# Patient Record
Sex: Male | Born: 1935 | Race: White | Hispanic: No | Smoking: Former smoker
Health system: Southern US, Community
[De-identification: ages and names within clinical notes are randomized; demographics above are authoritative.]

## PROBLEM LIST (undated history)

## (undated) DIAGNOSIS — T4145XA Adverse effect of unspecified anesthetic, initial encounter: Secondary | ICD-10-CM

## (undated) DIAGNOSIS — E7800 Pure hypercholesterolemia, unspecified: Secondary | ICD-10-CM

## (undated) DIAGNOSIS — C801 Malignant (primary) neoplasm, unspecified: Secondary | ICD-10-CM

## (undated) DIAGNOSIS — M1990 Unspecified osteoarthritis, unspecified site: Secondary | ICD-10-CM

## (undated) DIAGNOSIS — K5790 Diverticulosis of intestine, part unspecified, without perforation or abscess without bleeding: Secondary | ICD-10-CM

## (undated) DIAGNOSIS — I2510 Atherosclerotic heart disease of native coronary artery without angina pectoris: Secondary | ICD-10-CM

## (undated) DIAGNOSIS — E119 Type 2 diabetes mellitus without complications: Secondary | ICD-10-CM

## (undated) DIAGNOSIS — N183 Chronic kidney disease, stage 3 unspecified: Secondary | ICD-10-CM

## (undated) DIAGNOSIS — C44300 Unspecified malignant neoplasm of skin of unspecified part of face: Secondary | ICD-10-CM

## (undated) DIAGNOSIS — I200 Unstable angina: Secondary | ICD-10-CM

## (undated) DIAGNOSIS — R079 Chest pain, unspecified: Secondary | ICD-10-CM

## (undated) DIAGNOSIS — G3184 Mild cognitive impairment, so stated: Secondary | ICD-10-CM

## (undated) DIAGNOSIS — R001 Bradycardia, unspecified: Secondary | ICD-10-CM

## (undated) DIAGNOSIS — Z8679 Personal history of other diseases of the circulatory system: Secondary | ICD-10-CM

## (undated) DIAGNOSIS — D1771 Benign lipomatous neoplasm of kidney: Secondary | ICD-10-CM

## (undated) DIAGNOSIS — D049 Carcinoma in situ of skin, unspecified: Secondary | ICD-10-CM

## (undated) DIAGNOSIS — M545 Low back pain, unspecified: Secondary | ICD-10-CM

## (undated) DIAGNOSIS — J189 Pneumonia, unspecified organism: Secondary | ICD-10-CM

## (undated) DIAGNOSIS — G629 Polyneuropathy, unspecified: Secondary | ICD-10-CM

## (undated) DIAGNOSIS — I214 Non-ST elevation (NSTEMI) myocardial infarction: Secondary | ICD-10-CM

## (undated) DIAGNOSIS — K219 Gastro-esophageal reflux disease without esophagitis: Secondary | ICD-10-CM

## (undated) DIAGNOSIS — I10 Essential (primary) hypertension: Secondary | ICD-10-CM

## (undated) DIAGNOSIS — R0789 Other chest pain: Secondary | ICD-10-CM

## (undated) DIAGNOSIS — I429 Cardiomyopathy, unspecified: Secondary | ICD-10-CM

## (undated) DIAGNOSIS — I219 Acute myocardial infarction, unspecified: Secondary | ICD-10-CM

## (undated) DIAGNOSIS — G8929 Other chronic pain: Secondary | ICD-10-CM

## (undated) DIAGNOSIS — Z950 Presence of cardiac pacemaker: Secondary | ICD-10-CM

## (undated) DIAGNOSIS — K222 Esophageal obstruction: Secondary | ICD-10-CM

## (undated) DIAGNOSIS — T8859XA Other complications of anesthesia, initial encounter: Secondary | ICD-10-CM

## (undated) HISTORY — PX: COLONOSCOPY W/ BIOPSIES AND POLYPECTOMY: SHX1376

## (undated) HISTORY — DX: Cardiomyopathy, unspecified: I42.9

## (undated) HISTORY — PX: TONSILLECTOMY: SUR1361

## (undated) HISTORY — DX: Essential (primary) hypertension: I10

## (undated) HISTORY — PX: CATARACT EXTRACTION: SUR2

## (undated) HISTORY — PX: CORONARY ANGIOGRAPHY: CATH118303

## (undated) HISTORY — PX: CARDIAC CATHETERIZATION: SHX172

## (undated) HISTORY — DX: Unstable angina: I20.0

## (undated) HISTORY — DX: Bradycardia, unspecified: R00.1

## (undated) HISTORY — DX: Other chest pain: R07.89

## (undated) HISTORY — PX: CATARACT EXTRACTION W/ INTRAOCULAR LENS IMPLANT: SHX1309

## (undated) HISTORY — PX: EXCISIONAL HEMORRHOIDECTOMY: SHX1541

## (undated) HISTORY — PX: VASCULAR SURGERY: SHX849

## (undated) HISTORY — DX: Esophageal obstruction: K22.2

## (undated) HISTORY — DX: Diverticulosis of intestine, part unspecified, without perforation or abscess without bleeding: K57.90

## (undated) HISTORY — DX: Carcinoma in situ of skin, unspecified: D04.9

## (undated) HISTORY — DX: Benign lipomatous neoplasm of kidney: D17.71

## (undated) HISTORY — DX: Atherosclerotic heart disease of native coronary artery without angina pectoris: I25.10

## (undated) HISTORY — PX: JOINT REPLACEMENT: SHX530

## (undated) HISTORY — PX: SKIN CANCER EXCISION: SHX779

## (undated) HISTORY — DX: Personal history of other diseases of the circulatory system: Z86.79

## (undated) HISTORY — DX: Polyneuropathy, unspecified: G62.9

## (undated) HISTORY — DX: Non-ST elevation (NSTEMI) myocardial infarction: I21.4

## (undated) HISTORY — DX: Mild cognitive impairment of uncertain or unknown etiology: G31.84

## (undated) HISTORY — PX: ESOPHAGOGASTRODUODENOSCOPY: SHX1529

## (undated) HISTORY — DX: Chest pain, unspecified: R07.9

## (undated) HISTORY — PX: TOTAL KNEE ARTHROPLASTY: SHX125

---

## 2000-10-27 DIAGNOSIS — C801 Malignant (primary) neoplasm, unspecified: Secondary | ICD-10-CM | POA: Insufficient documentation

## 2000-10-27 HISTORY — PX: MASS EXCISION: SHX2000

## 2000-10-27 HISTORY — DX: Malignant (primary) neoplasm, unspecified: C80.1

## 2001-10-27 HISTORY — PX: CORONARY ANGIOPLASTY WITH STENT PLACEMENT: SHX49

## 2013-10-27 DIAGNOSIS — Z950 Presence of cardiac pacemaker: Secondary | ICD-10-CM

## 2013-10-27 HISTORY — DX: Presence of cardiac pacemaker: Z95.0

## 2013-10-27 HISTORY — PX: INSERT / REPLACE / REMOVE PACEMAKER: SUR710

## 2016-08-27 DIAGNOSIS — I214 Non-ST elevation (NSTEMI) myocardial infarction: Secondary | ICD-10-CM

## 2016-08-27 HISTORY — DX: Non-ST elevation (NSTEMI) myocardial infarction: I21.4

## 2016-12-17 ENCOUNTER — Ambulatory Visit (INDEPENDENT_AMBULATORY_CARE_PROVIDER_SITE_OTHER): Payer: Medicare Other | Admitting: Cardiology

## 2016-12-17 ENCOUNTER — Encounter: Payer: Self-pay | Admitting: Cardiology

## 2016-12-17 VITALS — BP 100/64 | HR 61 | Ht 72.0 in | Wt 221.0 lb

## 2016-12-17 DIAGNOSIS — I25119 Atherosclerotic heart disease of native coronary artery with unspecified angina pectoris: Secondary | ICD-10-CM

## 2016-12-17 DIAGNOSIS — I255 Ischemic cardiomyopathy: Secondary | ICD-10-CM

## 2016-12-17 DIAGNOSIS — Z950 Presence of cardiac pacemaker: Secondary | ICD-10-CM | POA: Diagnosis not present

## 2016-12-17 DIAGNOSIS — I495 Sick sinus syndrome: Secondary | ICD-10-CM | POA: Diagnosis not present

## 2016-12-17 DIAGNOSIS — E785 Hyperlipidemia, unspecified: Secondary | ICD-10-CM | POA: Insufficient documentation

## 2016-12-17 DIAGNOSIS — E119 Type 2 diabetes mellitus without complications: Secondary | ICD-10-CM | POA: Insufficient documentation

## 2016-12-17 LAB — CUP PACEART INCLINIC DEVICE CHECK
Battery Voltage: 3.01 V
Implantable Lead Location: 753860
Implantable Pulse Generator Implant Date: 20150916
Lead Channel Impedance Value: 475 Ohm
Lead Channel Pacing Threshold Amplitude: 0.75 V
Lead Channel Pacing Threshold Pulse Width: 0.4 ms
Lead Channel Pacing Threshold Pulse Width: 0.4 ms
Lead Channel Pacing Threshold Pulse Width: 0.4 ms
Lead Channel Pacing Threshold Pulse Width: 0.4 ms
Lead Channel Setting Pacing Pulse Width: 0.4 ms
MDC IDC LEAD IMPLANT DT: 20150916
MDC IDC LEAD IMPLANT DT: 20150916
MDC IDC LEAD LOCATION: 753859
MDC IDC MSMT LEADCHNL RA IMPEDANCE VALUE: 462.5 Ohm
MDC IDC MSMT LEADCHNL RA PACING THRESHOLD AMPLITUDE: 0.75 V
MDC IDC MSMT LEADCHNL RV PACING THRESHOLD AMPLITUDE: 1.25 V
MDC IDC MSMT LEADCHNL RV PACING THRESHOLD AMPLITUDE: 1.25 V
MDC IDC MSMT LEADCHNL RV SENSING INTR AMPL: 12 mV
MDC IDC PG SERIAL: 7659331
MDC IDC SESS DTM: 20180221112953
MDC IDC SET LEADCHNL RA PACING AMPLITUDE: 1.875
MDC IDC SET LEADCHNL RV PACING AMPLITUDE: 1.5 V
MDC IDC SET LEADCHNL RV SENSING SENSITIVITY: 2 mV
MDC IDC STAT BRADY RA PERCENT PACED: 46 %
MDC IDC STAT BRADY RV PERCENT PACED: 99.26 %

## 2016-12-17 NOTE — Progress Notes (Signed)
Electrophysiology Office Note   Date:  12/17/2016   ID:  Randall Callahan, DOB 06-09-1928, MRN [id number]  PCP:  No primary care provider on file. Primary Electrophysiologist:  Krish Bailly Meredith Leeds, MD    Chief Complaint  Patient presents with  . New Patient (Initial Visit)    Sick sinus syndrome     History of Present Illness: Randall Callahan is a 83 y.o. male who presents today for electrophysiology evaluation.   /He has a history of coronary artery disease status post stenting to the LAD and OM1 in 2003 with an occluded stent found in 2008, sick sinus syndrome status post dual-chamber pacemaker in 2015, hypertension, hyperlipidemia, type 2 diabetes. He had an an NSTEMI the beginning of Nov 2017 with a cath showing multivessel disease and no obvious targets for intervention. His medical management was optimized including beta blockers and long-acting nitrates. He was readmitted with an an STEMI. He did not have repeat catheterization at that time. His metoprolol was increased and he was put on heparin drip. His troponins increased to 0.24.   Today, he denies symptoms of palpitations, chest pain, shortness of breath, orthopnea, PND, lower extremity edema, claudication, dizziness, presyncope, syncope, bleeding, or neurologic sequela. The patient is tolerating medications without difficulties and is otherwise without complaint today.    Past Medical History:  Diagnosis Date  . Angina pectoris, unstable ([place])   . Angiomyolipoma of kidney   . ASCVD (arteriosclerotic cardiovascular disease)   . Bradycardia   . Cardiomyopathy ([place])   . Chest pain   . Chest tightness   . Coronary artery disease   . Diabetes mellitus without complication ([place])   . Diverticulosis   . Hx of sick sinus syndrome   . Hypertension   . MCI (mild cognitive impairment)   . NSTEMI (non-ST elevation myocardial infarction) ([place])   . Peripheral neuropathy ([place])   . Schatzki's ring   . Squamous cell carcinoma  in situ of skin    Past Surgical History:  Procedure Laterality Date  . CARDIAC CATHETERIZATION    . CORONARY ANGIOGRAPHY    . JOINT REPLACEMENT    . VASCULAR SURGERY       Current Outpatient Prescriptions  Medication Sig Dispense Refill  . aspirin EC 81 MG tablet Take 81 mg by mouth daily.    [name] atorvastatin (LIPITOR) 40 MG tablet Take 40 mg by mouth daily.    . clopidogrel (PLAVIX) 75 MG tablet Take 75 mg by mouth daily.    . isosorbide mononitrate (IMDUR) 30 MG 24 hr tablet Take 30 mg by mouth daily.    [name] lisinopril (PRINIVIL,ZESTRIL) 2.5 MG tablet Take 2.5 mg by mouth daily.    . metFORMIN (GLUCOPHAGE) 1000 MG tablet Take 1,000 mg by mouth 2 (two) times daily with a meal.    . METOPROLOL SUCCINATE ER PO 50 mg. TAKE 1.5 TABLETS BY MOUTH DAILY    . nitroGLYCERIN (NITROSTAT) 0.4 MG SL tablet Place 0.4 mg under the tongue every 5 (five) minutes as needed for chest pain. Up to 3 doses for chest pain. If pain has not subsided after 3 doses go to the ER.    . Omega-3 Fatty Acids (FISH OIL PO) Take 300 mg by mouth daily.    . pregabalin (LYRICA) 75 MG capsule Take 150 mg by mouth 2 (two) times daily.    . Probiotic Product (SOLUBLE FIBER/PROBIOTICS PO) Take by mouth daily.    . Wheat Dextrin (BENEFIBER) POWD Take as directed  No current facility-administered medications for this visit.     Allergies:   Patient has no known allergies.   Social History:  The patient  reports that he has quit smoking. His smoking use included Cigarettes. He quit after 2.00 years of use. He has never used smokeless tobacco. He reports that he drinks about 1.2 - 1.8 oz of alcohol per week . He reports that he does not use drugs.   Family History:  The patient's family history includes Angina in his mother; Cancer in his brother and brother.    ROS:  Please see the history of present illness.   Otherwise, review of systems is positive for leg pain, easy bruising.   All other systems are reviewed and  negative.    PHYSICAL EXAM: VS:  BP 100/64   Pulse 61   Ht 6' (1.829 m)   Wt 221 lb (100.2 kg)   BMI 29.97 kg/m  , BMI Body mass index is 29.97 kg/m. GEN: Well nourished, well developed, in no acute distress  HEENT: normal  Neck: no JVD, carotid bruits, or masses Cardiac: RRR; no murmurs, rubs, or gallops,no edema  Respiratory:  clear to auscultation bilaterally, normal work of breathing GI: soft, nontender, nondistended, + BS MS: no deformity or atrophy  Skin: warm and dry,  device pocket is well healed Neuro:  Strength and sensation are intact Psych: euthymic mood, full affect  EKG:  EKG is ordered today. Personal review of the ekg ordered shows AV paced   Device interrogation is reviewed today in detail.  See PaceArt for details.   Recent Labs: No results found for requested labs within last 8760 hours.    Lipid Panel  No results found for: CHOL, TRIG, HDL, CHOLHDL, VLDL, LDLCALC, LDLDIRECT   Wt Readings from Last 3 Encounters:  12/17/16 221 lb (100.2 kg)      Other studies Reviewed: Additional studies/ records that were reviewed today include: Transthoracic echo 11/21/16  Review of the above records today demonstrates: LV chamber size normal, basal septal hypertrophy, EF 44%. Normal RV global systolic function. Mild aortic regurgitation. Mild to moderate mitral regurgitation.  Cardiac cath 08/27/16  -Left main mild diffuse disease -LAD moderate diffuse disease, the mid segment of the LAD has a single discrete total occlusion. Distal flow was via collaterals from the RCA. The distal vessel was large. The lesion represented in-stent restenosis following a prior coronary stent intervention. -Left circumflex: Mild diffuse disease in the entire vessel. There was a 90+ percent diffuse stenosis of the proximal segment of the first OM. The arm was moderate size. Distal flow was decreased and was via the native vessel and collaterals from the RCA.  -Right coronary artery:  Mild diffuse disease. Moderate diffuse disease of the entire vessel segment of the right PDA.  -Ramus: Moderate diffuse disease of the proximal segment.   ASSESSMENT AND PLAN:  1.  Coronary artery disease: On aspirin and Plavix. Also takes long-acting nitrates and metoprolol for chest pain control. He has apparently diffuse coronary disease without obvious targets for intervention. He also has an occluded LAD with multiple vessels receiving collaterals from the RCA. We'll continue current management. He was undergoing cardiac rehabilitation in [place], but has since moved to [place] for a short period of time. We'll refer him to cardiac rehabilitation today.  2. Ischemic cardiomyopathy: Early well compensated without any issues of volume overload. On optimal medical therapy. Continue current management.  3. Sick sinus syndrome: Status post dual-chamber pacemaker. Pacemaker interrogated  today without issue.  4. Hyperlipidemia: Continue statin therapy.  Current medicines are reviewed at length with the patient today.   The patient does not have concerns regarding his medicines.  The following changes were made today:  none  Labs/ tests ordered today include:  Orders Placed This Encounter  Procedures  . EKG 12-Lead     Disposition:   FU with Medrith Veillon 6 months  Signed, Galina Haddox Meredith Leeds, MD  12/17/2016 11:13 AM     [facility] [address] [place] [address] [phone] (office) [phone] (fax)

## 2016-12-17 NOTE — Patient Instructions (Signed)
Medication Instructions:  Your physician recommends that you continue on your current medications as directed. Please refer to the Current Medication list given to you today.   Labwork: None Ordered   Testing/Procedures: None Ordered   Follow-Up: Your physician wants you to follow-up in: 6 months with Dr. Curt Bears. You will receive a reminder letter in the mail two months in advance. If you don't receive a letter, please call our office to schedule the follow-up appointment.  Remote monitoring is used to monitor your Pacemaker from home. This monitoring reduces the number of office visits required to check your device to one time per year. It allows [place] to keep an eye on the functioning of your device to ensure it is working properly. You are scheduled for a device check from home on . You may send your transmission at any time that day. If you have a wireless device, the transmission will be sent automatically. After your physician reviews your transmission, you will receive a postcard with your next transmission date.    Any Other Special Instructions Will Be Listed Below (If Applicable).     If you need a refill on your cardiac medications before your next appointment, please call your pharmacy.

## 2017-01-06 ENCOUNTER — Telehealth (HOSPITAL_COMMUNITY): Payer: Self-pay | Admitting: *Deleted

## 2017-01-06 ENCOUNTER — Telehealth: Payer: Self-pay | Admitting: Cardiology

## 2017-01-06 ENCOUNTER — Telehealth (HOSPITAL_COMMUNITY): Payer: Self-pay | Admitting: General Practice

## 2017-01-06 NOTE — Telephone Encounter (Signed)
Verified Medicare A/B & AARP insurance benefits through [employer]. Reference [phone] & [phone]...  KJ

## 2017-01-06 NOTE — Telephone Encounter (Signed)
New message    Pt daughter states that her dad never got a phone call from cardiac rehab for the referral we sent over. She states now they cannot see him until April and thinks he needs to be seen sooner so she wants the referral sent to [facility] because he can get in, in March. Requests a call back.

## 2017-01-06 NOTE — Telephone Encounter (Signed)
Returned call to patient daughter in law [place].  Message left on answering machine.  Pt daughter was en route from picking up medication for her daughter who was home sick.  [name] asked to call us back when she returned home in about 10 minutes.   [name] called back and spoke to rehab staff concerning her father in law participating in rehab.  Pt daughter in law voiced frustration and was very upset that this was taking so long. [name] stated that she called three different times and had not received any return call.  Rehab staff apologized and offered any assistance to help facilitate the process. Pt verbalized that she had also experienced a delay in care for her daughter to receive OT therapy at [facility].  Pt daughter in law stated that it took 6 months and she did not want this to happen to her father in law since he had a heart attack one month ago. Again offered sincere apologies on behalf of [facility] and reassured her that we have recently implemented steps within our department to improve the referral process. Offered pt daughter first available orientation appointment later portion of March or first week in april. Pt daughter did not want any delay and wanted to start sooner.  Offered cancellation list. Indicated that her father in law would be third on the list.  Pt daughter in law asked could he go somewhere else for his cardiac rehab.  Offered alternatives [facility], [facility] and [facility]. Pt declined [facility] indicated that would be too far  Pt asked for contact information for each of the other cardiac rehab programs.  Information given to [facility].  [name] advised that should she be able to get in sooner at another facility she would let us know but would like to remain on the cancellation list.  Pt placed on cancellation list. Maurice Small RN, BSN

## 2017-01-07 NOTE — Telephone Encounter (Signed)
Melissa faxing referral form.  Will review w/ [place] next Monday when he returns to the office.

## 2017-01-07 NOTE — Telephone Encounter (Signed)
Lm for Randall Callahan w/ [facility]

## 2017-01-07 NOTE — Telephone Encounter (Signed)
Dtr-in-law returns my call and would like pt to establish cardiac rehab in [facility].   She gave me contact name there of: Melissa [phone]. She understands I will call them today to see how I can help get this started for pt. She is agreeable to plan.

## 2017-01-08 ENCOUNTER — Ambulatory Visit (INDEPENDENT_AMBULATORY_CARE_PROVIDER_SITE_OTHER): Payer: Medicare Other | Admitting: Neurology

## 2017-01-08 ENCOUNTER — Encounter: Payer: Self-pay | Admitting: Neurology

## 2017-01-08 DIAGNOSIS — R269 Unspecified abnormalities of gait and mobility: Secondary | ICD-10-CM | POA: Diagnosis not present

## 2017-01-08 DIAGNOSIS — M542 Cervicalgia: Secondary | ICD-10-CM | POA: Diagnosis not present

## 2017-01-08 DIAGNOSIS — M545 Low back pain, unspecified: Secondary | ICD-10-CM | POA: Insufficient documentation

## 2017-01-08 NOTE — Progress Notes (Signed)
PATIENT: Randall Callahan DOB: 19-Jan-1928  Chief Complaint  Patient presents with  . Peripheral Neuropathy    He is here with daughter-in-law, [name].  Reports numbness in bilateral legs, below his knees, for the last five years.    [name] PCP    Dineen Kid, MD     HISTORICAL  Randall Callahan is 83 years old right-handed male, accompanied by his daughter-in-law [name], seen in refer by his primary care from [facility] Dr. [name] Via for evaluation of numbness from bilateral knee done, initial evaluation was on January 08 2017.  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 He had a history of sick sinus syndrome, status post pacemaker in 2016.  He reported as soon as she woke up from left knee replacement surgery in 2013 he noticed left ankle pain, numbness of left foot, which has been persistent since then,  shortly afterwards, he also noticed fairly subacute onset bilateral lower extremity numbness from knee down, over the past few years, his symptoms has been fairly stable, he has been taking Lyrica 75 mg twice a day, which has helped him moderately.   He also had a gradual onset gait abnormality, getting worse since 2017, stiff, unsteady gait, tends to fall, he has significant low back pain, getting worse by weight, with prolonged walking, he denies bowel and bladder incontinence, he also has intermittent right hand paresthesia, mild neck pain, but denies cervical radiating pain    For a while, he has frequent bilateral eye muscle cramping, now has improved.  Laboratory evaluation in March 2018, normal C-reactive protein, TSH, ESR, CMP with glucose 1:30, creatinine 1.29, with GFR 53, normal CBC, hemoglobin of 14.3, low vitamin B12  240,  REVIEW OF SYSTEMS: Full 14 system review of systems performed and notable only for as above   ALLERGIES: No Known Allergies  HOME MEDICATIONS: Current Outpatient Prescriptions  Medication Sig Dispense Refill  . aspirin EC 81 MG tablet Take 81 mg by mouth daily.    [name] atorvastatin (LIPITOR) 40 MG tablet Take 40 mg by mouth daily.    . clopidogrel (PLAVIX) 75 MG tablet Take 75 mg by mouth daily.    . isosorbide mononitrate (IMDUR) 30 MG 24 hr tablet Take 30 mg by mouth daily.    [name] lisinopril (PRINIVIL,ZESTRIL) 2.5 MG tablet Take 2.5 mg by mouth daily.    . metFORMIN (GLUCOPHAGE) 1000 MG tablet Take 1,000 mg by mouth 2 (two) times daily with a meal.    . METOPROLOL SUCCINATE ER PO 50 mg. TAKE 1.5 TABLETS BY MOUTH DAILY    . nitroGLYCERIN (NITROSTAT) 0.4 MG SL tablet Place 0.4 mg under the tongue every 5 (five) minutes as needed for chest pain. Up to 3 doses for chest pain. If pain has not subsided after 3 doses go to the ER.    . Omega-3 Fatty Acids (FISH OIL PO) Take 300 mg by mouth daily.    . pregabalin (LYRICA) 75 MG capsule Take 150 mg by mouth 2 (two) times daily.    . Probiotic Product (SOLUBLE FIBER/PROBIOTICS PO) Take by mouth daily.    . Wheat Dextrin (BENEFIBER) POWD Take as directed     No current facility-administered medications for this visit.     PAST MEDICAL HISTORY: Past  Medical History:  Diagnosis Date  . Angina pectoris, unstable ([facility])   . Angiomyolipoma of kidney   . ASCVD (arteriosclerotic cardiovascular disease)   . Bradycardia   . Cardiomyopathy ([facility])   . Chest pain   . Chest tightness   . Coronary artery disease   . Diabetes mellitus without complication ([facility])   . Diverticulosis   . Hx of sick sinus syndrome   . Hypertension   . MCI (mild cognitive impairment)   . NSTEMI (non-ST elevation myocardial infarction) ([facility])   . Peripheral neuropathy ([facility])   . Schatzki's ring   . Squamous cell carcinoma in situ of skin     PAST SURGICAL  HISTORY: Past Surgical History:  Procedure Laterality Date  . CARDIAC CATHETERIZATION    . CORONARY ANGIOGRAPHY    . JOINT REPLACEMENT    . VASCULAR SURGERY      FAMILY HISTORY: Family History  Problem Relation Age of Onset  . Angina Mother   . Stroke Mother   . Other Father     Car accident  . Prostate cancer Brother   . Cancer Brother     SOCIAL HISTORY:  Social History   Social History  . Marital status: Divorced    Spouse name: N/A  . Number of children: 6  . Years of education: some college   Occupational History  . Retired    Social History Main Topics  . Smoking status: Former Smoker    Years: 2.00    Types: Cigarettes  . Smokeless tobacco: Never Used     Comment: STARTED SMOKING IN 4TH GRADE, QUIT IN 6TH  . Alcohol use 1.2 - 1.8 oz/week    1 - 2 Glasses of wine, 1 Cans of beer per week     Comment: 1-2 drinks per week  . Drug use: No  . Sexual activity: No   Other Topics Concern  . Not on file   Social History Narrative   Lives at home with son and daughter-in-law.   Right-handed.   2 cups caffeine per day.     PHYSICAL EXAM   Vitals:   01/08/17 1018  BP: 114/67  Pulse: 62  Weight: 218 lb (98.9 kg)  Height: 6' (1.829 m)    Not recorded      Body mass index is 29.57 kg/m.  PHYSICAL EXAMNIATION:  Gen: NAD, conversant, well nourised, obese, well groomed                     Cardiovascular: Regular rate rhythm, no peripheral edema, warm, nontender. Eyes: Conjunctivae clear without exudates or hemorrhage Neck: Supple, no carotid bruits. Pulmonary: Clear to auscultation bilaterally   NEUROLOGICAL EXAM:  MENTAL STATUS: Speech:    Speech is normal; fluent and spontaneous with normal comprehension.  Cognition:     Orientation to time, place and person     Normal recent and remote memory     Normal Attention span and concentration     Normal Language, naming, repeating,spontaneous speech     Fund of knowledge   CRANIAL  NERVES: CN II: Visual fields are full to confrontation. Fundoscopic exam is normal with sharp discs and no vascular changes. Pupils are round equal and briskly reactive to light. CN III, IV, VI: extraocular movement are normal. No ptosis. CN V: Facial sensation is intact to pinprick in all 3 divisions bilaterally. Corneal responses are intact.  CN VII: Face is symmetric with normal eye closure and smile. CN VIII: Hearing is   normal to rubbing fingers CN IX, X: Palate elevates symmetrically. Phonation is normal. CN XI: Head turning and shoulder shrug are intact CN XII: Tongue is midline with normal movements and no atrophy.  MOTOR: He has mild spasticity of bilateral lower extremity, decreased range of motion of neck muscles, no significant bilateral upper or lower extremity proximal distal muscle noticed, with exception of mild left toe flexion, extension weakness.  REFLEXES: Reflexes are 3 and symmetric at the biceps, triceps, mildly decreased reflex at left knee due to previous left knee replacement, 3 at right knee and 2 at bilateral ankles. Plantar responses are extensor bilaterally  SENSORY: Decreased vibratory sensation at bilateral lower extremity, decrease the needle prick to midshin level,  COORDINATION: Rapid alternating movements and fine finger movements are intact. There is no dysmetria on finger-to-nose and heel-knee-shin.    GAIT/STANCE: He needs push up to get up from seated position, stiff, wide based, cautious, dragging left leg some  DIAGNOSTIC DATA (LABS, IMAGING, TESTING) - I reviewed patient records, labs, notes, testing and imaging myself where available.   ASSESSMENT AND PLAN  Cashius Grandstaff is a 83 y.o. male   subacute onset of bilateral lower extremity paresthesia from knee down following his left knee replacement in 2013 under general anesthesia  Long history of diabetes, multiple arthritis, chronic neck, low back pain  Hyperreflexia, length dependent  sensory changes on examinations  Differentiation diagnosis including cervical spondylitic myelopathy, lumbar radiculopathy, peripheral neuropathy,  Not MRI candidate due to pacemaker placement  CT of cervical spine, lumbar spine,  EMG nerve conduction study  Continue Lyrica 75 mg twice a day for symptoms control   Marcial Pacas, M.D. Ph.D.  [facility] [address], [place], [place] [address] Ph: [phone] Fax: [phone]  CC: Dineen Kid, MD

## 2017-01-12 NOTE — Telephone Encounter (Signed)
Rehab referral signed and faxed. Family notified.  Pt start therapy in [place] on Thursday.

## 2017-01-27 ENCOUNTER — Encounter: Payer: Self-pay | Admitting: Neurology

## 2017-02-06 ENCOUNTER — Ambulatory Visit
Admission: RE | Admit: 2017-02-06 | Discharge: 2017-02-06 | Disposition: A | Discharge status: Home / Self Care | Payer: Medicare Other | Source: Ambulatory Visit | Attending: Neurology | Admitting: Neurology

## 2017-02-06 DIAGNOSIS — M542 Cervicalgia: Secondary | ICD-10-CM

## 2017-02-06 DIAGNOSIS — R269 Unspecified abnormalities of gait and mobility: Secondary | ICD-10-CM

## 2017-02-06 DIAGNOSIS — M545 Low back pain: Secondary | ICD-10-CM

## 2017-02-11 ENCOUNTER — Telehealth: Payer: Self-pay | Admitting: Neurology

## 2017-02-11 NOTE — Telephone Encounter (Signed)
Please call patient, CT of cervical, and lumbar spine showed multilevel degenerative disc disease, with possible right L3 nerve roots compression,  I will review CT films at his next follow-up visit on May eleventh 2018  CT lumbar Atherosclerotic aorta. Focal bulging of the mid abdominal aorta posteriorly most likely due to atherosclerotic aneurysm measuring approximately 3 cm. Accurate measurement not possible. Recommend followup by ultrasound in 2 years. .  Mild spinal stenosis L2-3  Moderate spinal stenosis at L3-4 with moderate right foraminal encroachment and possible impingement of the right L3 nerve root  Mild spinal stenosis L4-5  Mild subarticular stenosis bilaterally L5-S1   CT cervical spine Multilevel disc and facet degeneration throughout the cervical spine  Right foraminal narrowing C2-3 due to spurring  Mild spinal stenosis and mild to moderate foraminal stenosis bilaterally at C3-4  Mild right foraminal narrowing at C4-5  Moderate foraminal narrowing bilaterally at C5-6  Moderate right foraminal stenosis at C6-7 with mild spinal stenosis.

## 2017-02-11 NOTE — Telephone Encounter (Signed)
Left message for a return call

## 2017-02-12 NOTE — Telephone Encounter (Signed)
Spoke to patient - reviewed his results with him.  He will keep his appt on 03/06/17 for further discussion with Dr. Krista Blue.

## 2017-02-12 NOTE — Telephone Encounter (Signed)
Pt returned your call, he is asking to be called on his mobile

## 2017-02-13 ENCOUNTER — Encounter: Payer: Medicare Other | Admitting: Neurology

## 2017-02-17 ENCOUNTER — Emergency Department (HOSPITAL_COMMUNITY): Payer: Medicare Other

## 2017-02-17 ENCOUNTER — Encounter (HOSPITAL_COMMUNITY): Payer: Self-pay

## 2017-02-17 ENCOUNTER — Emergency Department (HOSPITAL_COMMUNITY)
Admission: EM | Admit: 2017-02-17 | Discharge: 2017-02-17 | Disposition: A | Discharge status: Home / Self Care | Payer: Medicare Other | Attending: Emergency Medicine | Admitting: Emergency Medicine

## 2017-02-17 DIAGNOSIS — I252 Old myocardial infarction: Secondary | ICD-10-CM | POA: Diagnosis not present

## 2017-02-17 DIAGNOSIS — E119 Type 2 diabetes mellitus without complications: Secondary | ICD-10-CM | POA: Diagnosis not present

## 2017-02-17 DIAGNOSIS — J189 Pneumonia, unspecified organism: Secondary | ICD-10-CM | POA: Diagnosis not present

## 2017-02-17 DIAGNOSIS — I10 Essential (primary) hypertension: Secondary | ICD-10-CM | POA: Diagnosis not present

## 2017-02-17 DIAGNOSIS — Z7982 Long term (current) use of aspirin: Secondary | ICD-10-CM | POA: Diagnosis not present

## 2017-02-17 DIAGNOSIS — I2510 Atherosclerotic heart disease of native coronary artery without angina pectoris: Secondary | ICD-10-CM | POA: Insufficient documentation

## 2017-02-17 DIAGNOSIS — R05 Cough: Secondary | ICD-10-CM | POA: Diagnosis present

## 2017-02-17 DIAGNOSIS — Z87891 Personal history of nicotine dependence: Secondary | ICD-10-CM | POA: Insufficient documentation

## 2017-02-17 DIAGNOSIS — Z7984 Long term (current) use of oral hypoglycemic drugs: Secondary | ICD-10-CM | POA: Insufficient documentation

## 2017-02-17 LAB — CBC WITH DIFFERENTIAL/PLATELET
Basophils Absolute: 0 10*3/uL (ref 0.0–0.1)
Basophils Relative: 0 %
Eosinophils Absolute: 0.1 10*3/uL (ref 0.0–0.7)
Eosinophils Relative: 1 %
HEMATOCRIT: 42.1 % (ref 39.0–52.0)
HEMOGLOBIN: 14.3 g/dL (ref 13.0–17.0)
LYMPHS ABS: 2 10*3/uL (ref 0.7–4.0)
LYMPHS PCT: 19 %
MCH: 29.7 pg (ref 26.0–34.0)
MCHC: 34 g/dL (ref 30.0–36.0)
MCV: 87.3 fL (ref 78.0–100.0)
MONOS PCT: 7 %
Monocytes Absolute: 0.8 10*3/uL (ref 0.1–1.0)
NEUTROS ABS: 7.6 10*3/uL (ref 1.7–7.7)
NEUTROS PCT: 73 %
Platelets: 220 10*3/uL (ref 150–400)
RBC: 4.82 MIL/uL (ref 4.22–5.81)
RDW: 14.4 % (ref 11.5–15.5)
WBC: 10.5 10*3/uL (ref 4.0–10.5)

## 2017-02-17 LAB — COMPREHENSIVE METABOLIC PANEL
ALK PHOS: 72 U/L (ref 38–126)
ALT: 17 U/L (ref 17–63)
ANION GAP: 9 (ref 5–15)
AST: 25 U/L (ref 15–41)
Albumin: 3.9 g/dL (ref 3.5–5.0)
BILIRUBIN TOTAL: 1 mg/dL (ref 0.3–1.2)
BUN: 16 mg/dL (ref 6–20)
CALCIUM: 9.3 mg/dL (ref 8.9–10.3)
CO2: 24 mmol/L (ref 22–32)
CREATININE: 1.18 mg/dL (ref 0.61–1.24)
Chloride: 105 mmol/L (ref 101–111)
GFR calc non Af Amer: 54 mL/min — ABNORMAL LOW (ref >60)
Glucose, Bld: 153 mg/dL — ABNORMAL HIGH (ref 65–99)
Potassium: 4.3 mmol/L (ref 3.5–5.1)
SODIUM: 138 mmol/L (ref 135–145)
TOTAL PROTEIN: 7.1 g/dL (ref 6.5–8.1)

## 2017-02-17 LAB — CBG MONITORING, ED: Glucose-Capillary: 182 mg/dL — ABNORMAL HIGH (ref 65–99)

## 2017-02-17 LAB — TROPONIN I: Troponin I: <0.03 ng/mL (ref <0.03)

## 2017-02-17 MED ORDER — ALBUTEROL SULFATE HFA 108 (90 BASE) MCG/ACT IN AERS
2.0000 | INHALATION_SPRAY | Freq: Once | RESPIRATORY_TRACT | Status: DC
  Filled 2017-02-17: qty 6.7

## 2017-02-17 MED ORDER — AZITHROMYCIN 250 MG PO TABS: 250.0000 mg | ORAL_TABLET | Freq: Every day | ORAL | 0 refills | Status: DC

## 2017-02-17 MED ORDER — DEXTROSE 5 % IV SOLN
500.0000 mg | INTRAVENOUS | Status: DC
  Administered 2017-02-17: 500 mg via INTRAVENOUS
  Filled 2017-02-17: qty 500

## 2017-02-17 MED ORDER — DEXTROSE 5 % IV SOLN
1.0000 g | Freq: Once | INTRAVENOUS | Status: AC
  Administered 2017-02-17: 1 g via INTRAVENOUS
  Filled 2017-02-17: qty 10

## 2017-02-17 NOTE — ED Provider Notes (Signed)
[facility] DEPT Provider Note   CSN: [id number] Arrival date & time: 02/17/17  2353     History   Chief Complaint Chief Complaint  Patient presents with  . Cough    HPI Randall Callahan is a 83 y.o. male.  The history is provided by the patient. No language interpreter was used.  Cough  This is a new problem. The current episode started more than 2 days ago. The problem occurs constantly. The problem has been gradually worsening. The cough is productive of sputum. There has been no fever. Pertinent negatives include no chest pain. He has tried nothing for the symptoms. The treatment provided no relief. He is not a smoker. His past medical history does not include bronchitis or asthma.    Past Medical History:  Diagnosis Date  . Angina pectoris, unstable ([place])   . Angiomyolipoma of kidney   . ASCVD (arteriosclerotic cardiovascular disease)   . Bradycardia   . Cardiomyopathy ([place])   . Chest pain   . Chest tightness   . Coronary artery disease   . Diabetes mellitus without complication ([place])   . Diverticulosis   . Hx of sick sinus syndrome   . Hypertension   . MCI (mild cognitive impairment)   . NSTEMI (non-ST elevation myocardial infarction) ([place])   . Peripheral neuropathy   . Schatzki's ring   . Squamous cell carcinoma in situ of skin     Patient Active Problem List   Diagnosis Date Noted  . Gait abnormality 01/08/2017  . Neck pain 01/08/2017  . Low back pain 01/08/2017  . Ischemic cardiomyopathy 12/17/2016  . Coronary artery disease involving native coronary artery of native heart with angina pectoris ([place]) 12/17/2016  . Type 2 diabetes mellitus without complications ([place]) 61/44/3154  . Hyperlipidemia 12/17/2016    Past Surgical History:  Procedure Laterality Date  . CARDIAC CATHETERIZATION    . CORONARY ANGIOGRAPHY    . JOINT REPLACEMENT    . VASCULAR SURGERY         Home Medications    Prior to Admission medications   Medication Sig Start  Date End Date Taking? Authorizing Provider  aspirin 325 MG tablet Take 325-650 mg by mouth every 4 (four) hours as needed for mild pain, moderate pain, fever or headache.   Yes Historical Provider, MD  aspirin EC 81 MG tablet Take 81 mg by mouth daily with breakfast.    Yes Historical Provider, MD  atorvastatin (LIPITOR) 40 MG tablet Take 40 mg by mouth at bedtime.    Yes Historical Provider, MD  bifidobacterium infantis (ALIGN) capsule Take 1 capsule by mouth daily with breakfast.   Yes Historical Provider, MD  bismuth subsalicylate (PEPTO BISMOL) 262 MG chewable tablet Chew 262-524 mg by mouth as needed for indigestion or diarrhea or loose stools.    Yes Historical Provider, MD  clopidogrel (PLAVIX) 75 MG tablet Take 75 mg by mouth daily with breakfast.    Yes Historical Provider, MD  isosorbide mononitrate (IMDUR) 30 MG 24 hr tablet Take 30 mg by mouth daily with breakfast.    Yes Historical Provider, MD  Javier Docker Oil 300 MG CAPS Take 300 mg by mouth every evening.   Yes Historical Provider, MD  lisinopril (PRINIVIL,ZESTRIL) 2.5 MG tablet Take 2.5 mg by mouth daily with breakfast.    Yes Historical Provider, MD  metFORMIN (GLUCOPHAGE) 1000 MG tablet Take 1,000 mg by mouth 2 (two) times daily with a meal.   Yes Historical Provider, MD  metoprolol succinate (TOPROL-XL)  50 MG 24 hr tablet Take 75 mg by mouth daily after breakfast. Take with or immediately following a meal.   Yes Historical Provider, MD  nitroGLYCERIN (NITROSTAT) 0.4 MG SL tablet Place 0.4 mg under the tongue every 5 (five) minutes as needed for chest pain. Up to 3 doses for chest pain. If pain has not subsided after 3 doses go to the ER.   Yes Historical Provider, MD  pregabalin (LYRICA) 150 MG capsule Take 150 mg by mouth 2 (two) times daily.   Yes Historical Provider, MD  Wheat Dextrin (BENEFIBER) POWD Take 15 mLs by mouth daily with breakfast. Take as directed    Yes Historical Provider, MD    Family History Family History    Problem Relation Age of Onset  . Angina Mother   . Stroke Mother   . Other Father     Car accident  . Prostate cancer Brother   . Cancer Brother     Social History Social History  Substance Use Topics  . Smoking status: Former Smoker    Years: 2.00    Types: Cigarettes  . Smokeless tobacco: Never Used     Comment: STARTED SMOKING IN 4TH GRADE, QUIT IN [place]  . Alcohol use 1.2 - 1.8 oz/week    1 - 2 Glasses of wine, 1 Cans of beer per week     Comment: 1-2 drinks per week     Allergies   Patient has no known allergies.   Review of Systems Review of Systems  Respiratory: Positive for cough.   Cardiovascular: Negative for chest pain.  All other systems reviewed and are negative.    Physical Exam Updated Vital Signs BP 113/76   Pulse (!) 105   Temp 98.3 F (36.8 C)   Resp 20   SpO2 96%   Physical Exam  Constitutional: He appears well-developed and well-nourished.  HENT:  Head: Normocephalic and atraumatic.  Right Ear: External ear normal.  Left Ear: External ear normal.  Mouth/Throat: Oropharynx is clear and moist.  Eyes: Conjunctivae are normal.  Neck: Neck supple.  Cardiovascular: Normal rate and regular rhythm.   No murmur heard. Pulmonary/Chest: Effort normal and breath sounds normal. No respiratory distress.  Rhonchi with deep breaths  Abdominal: Soft. There is no tenderness.  Musculoskeletal: He exhibits no edema.  Neurological: He is alert.  Skin: Skin is warm and dry.  Psychiatric: He has a normal mood and affect.  Nursing note and vitals reviewed.    ED Treatments / Results  Labs (all labs ordered are listed, but only abnormal results are displayed) Labs Reviewed  COMPREHENSIVE METABOLIC PANEL - Abnormal; Notable for the following:       Result Value   Glucose, Bld 153 (*)    GFR calc non Af Amer 54 (*)    All other components within normal limits  CBC WITH DIFFERENTIAL/PLATELET  TROPONIN I    EKG  EKG  Interpretation  Date/Time:  Tuesday February 17 2017 07:53:56 EDT Ventricular Rate:  63 PR Interval:    QRS Duration: 194 QT Interval:  469 QTC Calculation: 481 R Axis:   -68 Text Interpretation:  A-V dual-paced rhythm with some inhibition No further analysis attempted due to paced rhythm Confirmed by Wilson Singer  MD, STEPHEN [id number]) on 02/17/2017 9:22:07 AM       Radiology Dg Chest 2 View  Result Date: 02/17/2017 CLINICAL DATA:  83 year old male with wheezing and nonproductive cough for 3 days. EXAM: CHEST  2 VIEW COMPARISON:  None. FINDINGS: Left chest dual lead cardiac pacemaker. Large lung volumes. Mediastinal contours are within normal limits. Visualized tracheal air column is within normal limits. Attenuated bronchovascular markings in the upper lobes and at the lung bases suggesting emphysema. Superimposed bilateral perihilar patchy and reticulonodular opacity greater on the right. No consolidation. No pleural effusion. No acute osseous abnormality identified. Negative visible bowel gas pattern. IMPRESSION: 1. Chronic lung disease with hyperinflation. 2. No prior comparison. Right greater than left perihilar patchy and interstitial opacities are nonspecific but suspicious for multifocal pneumonia in this clinical setting. No pleural effusion. 3. Left chest pacemaker. Electronically Signed   By: Genevie Ann M.D.   On: 02/17/2017 07:22    Procedures Procedures (including critical care time)  Medications Ordered in ED Medications  azithromycin (ZITHROMAX) 500 mg in dextrose 5 % 250 mL IVPB (500 mg Intravenous New Bag/Given 02/17/17 0930)  cefTRIAXone (ROCEPHIN) 1 g in dextrose 5 % 50 mL IVPB (1 g Intravenous New Bag/Given 02/17/17 0929)   Pt on 02 at 2 liters.  Pt drops sats to low 90's when coughing.  Chest xray shows probable multifocal pneumonia.   Pt given Iv Rocephin and zithromax.  Pt was able to stand up and cough up phelgm and is breathing better per RN.   99 without 02.  Pt feels better.   Pt given flutter valve and albuterol inhaler with instructions per respiratory.    Initial Impression / Assessment and Plan / ED Course  I have reviewed the triage vital signs and the nursing notes.  Pertinent labs & imaging results that were available during my care of the patient were reviewed by me and considered in my medical decision making (see chart for details).  Clinical Course as of Feb 18 1043  Tue Feb 17, 2017  0902 EKG 12-Lead [LS]  0903 EKG 12-Lead [LS]  8185 ED EKG [LS]  0903 EKG 12-Lead [LS]    Clinical Course User Index [LS] Fransico Meadow, PA-C    Iv Zithromax and Rocephin  Final Clinical Impressions(s) / ED Diagnoses   Final diagnoses:  Community acquired pneumonia, unspecified laterality   Meds ordered this encounter  Medications  . cefTRIAXone (ROCEPHIN) 1 g in dextrose 5 % 50 mL IVPB    Order Specific Question:   Antibiotic Indication:    Answer:   UTI  . azithromycin (ZITHROMAX) 500 mg in dextrose 5 % 250 mL IVPB    Order Specific Question:   Antibiotic Indication:    Answer:   CAP  . metoprolol succinate (TOPROL-XL) 50 MG 24 hr tablet    Sig: Take 75 mg by mouth daily after breakfast. Take with or immediately following a meal.  . bifidobacterium infantis (ALIGN) capsule    Sig: Take 1 capsule by mouth daily with breakfast.  . aspirin 325 MG tablet    Sig: Take 325-650 mg by mouth every 4 (four) hours as needed for mild pain, moderate pain, fever or headache.  . bismuth subsalicylate (PEPTO BISMOL) 262 MG chewable tablet    Sig: Chew 262-524 mg by mouth as needed for indigestion or diarrhea or loose stools.   . pregabalin (LYRICA) 150 MG capsule    Sig: Take 150 mg by mouth 2 (two) times daily.  Javier Docker Oil 300 MG CAPS    Sig: Take 300 mg by mouth every evening.  [name] albuterol (PROVENTIL HFA;VENTOLIN HFA) 108 (90 Base) MCG/ACT inhaler 2 puff  . azithromycin (ZITHROMAX) 250 MG tablet    Sig: Take  1 tablet (250 mg total) by mouth daily. Take first 2  tablets together, then 1 every day until finished.    Dispense:  6 tablet    Refill:  0    Order Specific Question:   Supervising Provider    Answer:   Noemi Chapel [[id number]]   New Prescriptions New Prescriptions   No medications on file  An After Visit Summary was printed and given to the patient.   Hollace Kinnier [place], PA-C 02/17/17 [place], MD 02/25/17 [phone]

## 2017-02-17 NOTE — Discharge Instructions (Signed)
See your Physician for recheck in 2-3 days.  Return if any problems. °

## 2017-02-17 NOTE — ED Triage Notes (Signed)
Pt c/o chest congestion, started wheezing 3 days ago and now has a productive, frothy cough and heard rattling when breathing. Crackles heard in both lungs, more on right. Hx of 2 MIs since November.

## 2017-03-06 ENCOUNTER — Ambulatory Visit (INDEPENDENT_AMBULATORY_CARE_PROVIDER_SITE_OTHER): Payer: Medicare Other | Admitting: Neurology

## 2017-03-06 DIAGNOSIS — M545 Low back pain: Secondary | ICD-10-CM | POA: Diagnosis not present

## 2017-03-06 DIAGNOSIS — R269 Unspecified abnormalities of gait and mobility: Secondary | ICD-10-CM

## 2017-03-06 DIAGNOSIS — I255 Ischemic cardiomyopathy: Secondary | ICD-10-CM | POA: Diagnosis not present

## 2017-03-06 DIAGNOSIS — M542 Cervicalgia: Secondary | ICD-10-CM | POA: Diagnosis not present

## 2017-03-06 NOTE — Procedures (Signed)
Full Name: Randall Callahan Gender: Male MRN #: [id number] Date of Birth: 04-17-1928    Visit Date: 03/06/2017 10:27 Age: 83 Years 2 Months Old Examining Physician: Marcial Pacas, MD  Referring Physician: Krista Blue, MD  History of present illness: 83 years old male, with history of chronic neck pain, low back pain, bilateral lower extremity paresthesia, mild unsteady gait.  Summary of the test:  Nerve conduction study: Bilateral sural, superficial peroneal, right median sensory responses were absent.  Right tibial, left peroneal to EDB motor responses were absent. Left tibial and right peroneal to EDB motor response showed severely decreased C map amplitude.  Right ulnar sensory and motor responses were normal.  Right median sensory response was absent.  Right median motor responses showed severely prolonged distal latency, moderately decreased to C map amplitude, with moderately slow conduction velocity.   Electromyography: Selective needle examinations were performed at bilateral lower extremity muscles, right upper extremity, bilateral lumbar sacral paraspinal muscles, and right cervical paraspinal muscles.  There is evidence of chronic neuropathic changes involving bilateral tibialis anterior, tibialis posterior, peroneal longus, medial gastrocnemius.   There is evidence of chronic neuropathic changes involving right abductor pollicis brevis, but there is no evidence of axonal loss. There is also mild chronic neuropathic changes involving right first dorsal interossei.    Conclusion: This is an abnormal study. There is electrodiagnostic evidence of length dependent mild axonal sensorimotor polyneuropathy. There is evidence of mild chronic bilateral lumbosacral radiculopathy, mainly involving bilateral L4-5 S1 myotomes.. There is evidence of moderate to severe right carpal tunnel syndromes, slight chronic right cervical radiculopathy.    ------------------------------- Marcial Pacas, M.D.  [facility] [place], [place] [address] Tel: [phone] Fax: [phone]        [facility]    Nerve / Sites Rec. Site Latency Ref. Amplitude Ref. Rel Amp Segments Distance Velocity Ref. Area    ms ms mV mV %  cm m/s m/s mVms  R Median - APB     Wrist APB 10.5 ?4.4 2.9 ?4.0 100 Wrist - APB 7   11.7     Upper arm APB 18.0  2.2  76.3 Upper arm - Wrist 27 36 ?49 9.1  R Ulnar - ADM     Wrist ADM 3.3 ?3.3 8.7 ?6.0 100 Wrist - ADM 7   32.8     B.Elbow ADM 8.3  7.5  85.6 B.Elbow - Wrist 22 44 ?49 30.6     A.Elbow ADM 11.7  7.2  96.8 A.Elbow - B.Elbow 12 35 ?49 29.9         A.Elbow - Wrist      L Peroneal - EDB     Ankle EDB NR ?6.5 NR ?2.0 NR Ankle - EDB 9   NR         Pop fossa - Ankle      R Peroneal - EDB     Ankle EDB 6.8 ?6.5 0.4 ?2.0 100 Ankle - EDB 9   1.3     Fib head EDB 18.4  0.4  93.3 Fib head - Ankle 35 30 ?44 1.3     Pop fossa EDB 21.6  0.4  101 Pop fossa - Fib head 10 32 ?44 1.3         Pop fossa - Ankle      L Tibial - AH     Ankle AH 6.0 ?5.8 0.6 ?4.0 100 Ankle - AH 9   2.2  Pop fossa AH 18.3  0.5  89.8 Pop fossa - Ankle 42 34 ?41 5.9  R Tibial - AH     Ankle AH NR ?5.8 NR ?4.0 NR Ankle - AH 9   NR                 SNC    Nerve / Sites Rec. Site Peak Lat Ref.  Amp Ref. Segments Distance    ms ms V V  cm  L Sural - Ankle (Calf)     Calf Ankle NR ?4.40 NR ?6 Calf - Ankle 14  R Sural - Ankle (Calf)     Calf Ankle NR ?4.40 NR ?6 Calf - Ankle 14  R Superficial peroneal - Ankle     Lat leg Ankle NR ?4.40 NR ?6 Lat leg - Ankle 14  L Superficial peroneal - Ankle     Lat leg Ankle NR ?4.40 NR ?6 Lat leg - Ankle 14  R Median - Orthodromic (Dig II, Mid palm)     Dig II Wrist NR ?3.40 NR ?10 Dig II - Wrist 13  R Ulnar - Orthodromic, (Dig V, Mid palm)     Dig V Wrist 3.18 ?3.10 6 ?5 Dig V - Wrist 18                 F  Wave    Nerve F Lat Ref.   ms ms  L Tibial - AH 60.4 ?56.0  R Peroneal - EDB NR ?56.0  R Ulnar - ADM 37.7  ?32.0           EMG full       EMG Summary Table    Spontaneous MUAP Recruitment  Muscle IA Fib PSW Fasc Other Amp Dur. Poly Pattern  R. Tibialis anterior Normal None None None _______ Increased Normal Normal Reduced  R. Tibialis posterior Normal None None None _______ Increased Increased Normal Reduced  R. Gastrocnemius (Medial head) Normal None None None _______ Increased Normal Normal Reduced  R. Vastus lateralis Normal None None None _______ Normal Normal Normal Reduced  L. Tibialis anterior Normal None None None _______ Increased Normal Normal Reduced  L. Tibialis posterior Normal None None None _______ Increased Normal Normal Reduced  L. Peroneus longus Normal None None None _______ Normal Normal Normal Reduced  L. Gastrocnemius (Medial head) Normal None None None _______ Normal Normal Normal Reduced  L. Vastus lateralis Normal None None None _______ Increased Normal Normal Reduced  R. Lumbar paraspinals (low) Normal None None None _______ Normal Normal Normal Normal  R. Lumbar paraspinals (mid) Normal None None None _______ Normal Normal Normal Normal  L. Lumbar paraspinals (low) Normal None None None _______ Normal Normal Normal Normal  L. Lumbar paraspinals (mid) Normal None None None _______ Normal Normal Normal Normal  R. Abductor pollicis brevis Increased None None None _______ Increased Normal Normal Reduced  R. First dorsal interosseous Normal None None None _______ Normal Normal Normal Reduced  R. Pronator teres Normal None None None _______ Normal Normal Normal Normal  R. Biceps brachii Normal None None None _______ Normal Normal Normal Normal  R. Deltoid Normal None None None _______ Normal Normal Normal Normal  R. Triceps brachii Normal None None None _______ Normal Normal Normal Normal  R. Cervical paraspinals Normal None None None _______ Normal Normal Normal Normal

## 2017-03-06 NOTE — Progress Notes (Signed)
PATIENT: Randall Callahan DOB: Feb 25, 1928     HISTORICAL  Randall Callahan is 83 years old right-handed male, accompanied by his daughter-in-law [name], seen in refer by his primary care from [facility] Dr. [name] Via for evaluation of numbness from bilateral knee done, initial evaluation was on January 08 2017.  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 He had a history of sick sinus syndrome, status post pacemaker in 2016.  He reported as soon as she woke up from left knee replacement surgery in 2013 he noticed left ankle pain, numbness of left foot, which has been persistent since then,  shortly afterwards, he also noticed fairly subacute onset bilateral lower extremity numbness from knee down, over the past few years, his symptoms has been fairly stable, he has been taking Lyrica 75 mg twice a day, which has helped him moderately.   He also had a gradual onset gait abnormality, getting worse since 2017, stiff, unsteady gait, tends to fall, he has significant low back pain, getting worse by weight, with prolonged walking, he denies bowel and bladder incontinence, he also has intermittent right hand paresthesia, mild neck pain, but denies cervical radiating pain    For a while, he has frequent bilateral eye muscle cramping, now has improved.  Laboratory evaluation in March 2018, normal C-reactive protein, TSH, ESR, CMP with glucose 1:30, creatinine 1.29, with GFR 53, normal CBC, hemoglobin of 14.3, low vitamin B12 240,  Update Mar 06 2017: He returned for electrodiagnostic study today, which demonstrated list dependent mild axonal peripheral neuropathy, there is also evidence of mild bilateral lumbar radiculopathy, moderately severe  right carpal tunnel syndromes.  We have personally reviewed CT cervical without contrast on February 06 2017, multilevel degenerative disc disease, mild canal stenosis and mild-to-moderate foraminal stenosis at C3-4, C6-7, mild to moderate degree of foraminal stenosis at C4-5, C5-6, C 6-7  CT lumbar, moderate spinal stenosis at L3-4, with moderate right foraminal encroachment, possible right L3 nerve root compression, multilevel degenerative disc disease, mild to moderate foraminal stenosis at L4-5, L5-S1.  He complains of frequent wakening at nighttime radiating pain from right hand to right arm, frequent bilateral calf muscle cramping  REVIEW OF SYSTEMS: Full 14 system review of systems performed and notable only for as above   ALLERGIES: No Known Allergies  HOME MEDICATIONS: Current Outpatient Prescriptions  Medication Sig Dispense Refill  . aspirin 325 MG tablet Take 325-650 mg by mouth every 4 (four) hours as needed for mild pain, moderate pain, fever or headache.    [name] aspirin EC 81 MG tablet Take 81 mg by mouth daily with breakfast.     . atorvastatin (LIPITOR) 40 MG tablet Take 40 mg by mouth at bedtime.     [name] azithromycin (ZITHROMAX) 250 MG tablet Take 1 tablet (250 mg total) by mouth daily. Take first 2 tablets together, then 1 every day until finished. 6 tablet 0  . bifidobacterium infantis (ALIGN) capsule Take 1 capsule by mouth daily with breakfast.    . bismuth subsalicylate (PEPTO BISMOL) 262 MG chewable tablet Chew 262-524 mg by mouth as needed for indigestion or diarrhea or loose stools.     . clopidogrel (PLAVIX) 75 MG tablet Take 75 mg by mouth daily with breakfast.     .  isosorbide mononitrate (IMDUR) 30 MG 24 hr tablet Take 30 mg by mouth daily with breakfast.     . Krill Oil 300 MG CAPS Take 300 mg by mouth every evening.    [name] lisinopril (PRINIVIL,ZESTRIL) 2.5 MG tablet Take 2.5 mg by mouth daily with breakfast.     . metFORMIN (GLUCOPHAGE) 1000 MG tablet Take 1,000 mg by  mouth 2 (two) times daily with a meal.    . metoprolol succinate (TOPROL-XL) 50 MG 24 hr tablet Take 75 mg by mouth daily after breakfast. Take with or immediately following a meal.    . nitroGLYCERIN (NITROSTAT) 0.4 MG SL tablet Place 0.4 mg under the tongue every 5 (five) minutes as needed for chest pain. Up to 3 doses for chest pain. If pain has not subsided after 3 doses go to the ER.    . pregabalin (LYRICA) 150 MG capsule Take 150 mg by mouth 2 (two) times daily.    . Wheat Dextrin (BENEFIBER) POWD Take 15 mLs by mouth daily with breakfast. Take as directed      No current facility-administered medications for this visit.     PAST MEDICAL HISTORY: Past Medical History:  Diagnosis Date  . Angina pectoris, unstable ([place])   . Angiomyolipoma of kidney   . ASCVD (arteriosclerotic cardiovascular disease)   . Bradycardia   . Cardiomyopathy ([place])   . Chest pain   . Chest tightness   . Coronary artery disease   . Diabetes mellitus without complication ([place])   . Diverticulosis   . Hx of sick sinus syndrome   . Hypertension   . MCI (mild cognitive impairment)   . NSTEMI (non-ST elevation myocardial infarction) ([place])   . Peripheral neuropathy   . Schatzki's ring   . Squamous cell carcinoma in situ of skin     PAST SURGICAL HISTORY: Past Surgical History:  Procedure Laterality Date  . CARDIAC CATHETERIZATION    . CORONARY ANGIOGRAPHY    . JOINT REPLACEMENT    . VASCULAR SURGERY      FAMILY HISTORY: Family History  Problem Relation Age of Onset  . Angina Mother   . Stroke Mother   . Other Father        Car accident  . Prostate cancer Brother   . Cancer Brother     SOCIAL HISTORY:  Social History   Social History  . Marital status: Divorced    Spouse name: N/A  . Number of children: 6  . Years of education: some college   Occupational History  . Retired    Social History Main Topics  . Smoking status: Former Smoker    Years: 2.00    Types: Cigarettes  .  Smokeless tobacco: Never Used     Comment: STARTED SMOKING IN 4TH GRADE, QUIT IN [place]  . Alcohol use 1.2 - 1.8 oz/week    1 - 2 Glasses of wine, 1 Cans of beer per week     Comment: 1-2 drinks per week  . Drug use: No  . Sexual activity: No   Other Topics Concern  . Not on file   Social History Narrative   Lives at home with son and daughter-in-law.   Right-handed.   2 cups caffeine per day.     PHYSICAL EXAM   There were no vitals filed for this visit.  Not recorded      There is no height or weight on file to calculate BMI.  PHYSICAL EXAMNIATION:  Gen: NAD, conversant, well  nourised, obese, well groomed                     Cardiovascular: Regular rate rhythm, no peripheral edema, warm, nontender. Eyes: Conjunctivae clear without exudates or hemorrhage Neck: Supple, no carotid bruits. Pulmonary: Clear to auscultation bilaterally   NEUROLOGICAL EXAM:  MENTAL STATUS: Speech:    Speech is normal; fluent and spontaneous with normal comprehension.  Cognition:     Orientation to time, place and person     Normal recent and remote memory     Normal Attention span and concentration     Normal Language, naming, repeating,spontaneous speech     Fund of knowledge   CRANIAL NERVES: CN II: Visual fields are full to confrontation. Fundoscopic exam is normal with sharp discs and no vascular changes. Pupils are round equal and briskly reactive to light. CN III, IV, VI: extraocular movement are normal. No ptosis. CN V: Facial sensation is intact to pinprick in all 3 divisions bilaterally. Corneal responses are intact.  CN VII: Face is symmetric with normal eye closure and smile. CN VIII: Hearing is normal to rubbing fingers CN IX, X: Palate elevates symmetrically. Phonation is normal. CN XI: Head turning and shoulder shrug are intact CN XII: Tongue is midline with normal movements and no atrophy.  MOTOR: He has mild spasticity of bilateral lower extremity, decreased range of  motion of neck muscles, no significant bilateral upper or lower extremity proximal distal muscle noticed, with exception of mild left toe flexion, extension weakness.  REFLEXES: Reflexes are 3 and symmetric at the biceps, triceps, mildly decreased reflex at left knee due to previous left knee replacement, 3 at right knee and 2 at bilateral ankles. Plantar responses are extensor bilaterally  SENSORY: Decreased vibratory sensation at bilateral lower extremity, decrease the needle prick to midshin level,  COORDINATION: Rapid alternating movements and fine finger movements are intact. There is no dysmetria on finger-to-nose and heel-knee-shin.    GAIT/STANCE: He needs push up to get up from seated position, stiff, wide based, cautious, dragging left leg some  DIAGNOSTIC DATA (LABS, IMAGING, TESTING) - I reviewed patient records, labs, notes, testing and imaging myself where available.   ASSESSMENT AND PLAN  Randall Callahan is a 83 y.o. male   Peripheral neuropathy  Most likely due to his diabetes, will consider extensive laboratory evaluation at next follow-up visit in 6 months Chronic mild lumbar radiculopathy Gait abnormality Right carpal tunnel syndromes  I have suggested him to have right wrist pain,  Refer him to physical therapy  Continue Lyrica 75 mg twice a day  Marcial Pacas, M.D. Ph.D.  [facility] [address] [place], [place] [address] Ph: [phone] Fax: [phone]  CC: Dineen Kid, MD

## 2017-03-12 ENCOUNTER — Telehealth: Payer: Self-pay | Admitting: Cardiology

## 2017-03-12 NOTE — Telephone Encounter (Signed)
Routing to [employer] for advisement. (Dr. Curt Bears - pt does not have a general cardiologist, that I can see)

## 2017-03-12 NOTE — Telephone Encounter (Signed)
New message    Pt c/o of Chest Pain: STAT if CP now or developed within 24 hours  1. Are you having CP right now? No-per Nicoles phone call it's more tightness than pain  2. Are you experiencing any other symptoms (ex. SOB, nausea, vomiting, sweating)? No   3. How long have you been experiencing CP? 6 weeks  4. Is your CP continuous or coming and going? Coming and going  5. Have you taken Nitroglycerin? Doreatha Martin was the last time this week. Per [name], pt has had to take at least one a week in the last 6 weeks.  [name] said pt will be home after 12:30 and can be reached after that. ?

## 2017-03-16 ENCOUNTER — Encounter: Payer: Self-pay | Admitting: Cardiology

## 2017-03-16 ENCOUNTER — Ambulatory Visit (INDEPENDENT_AMBULATORY_CARE_PROVIDER_SITE_OTHER): Payer: Medicare Other | Admitting: Cardiology

## 2017-03-16 VITALS — BP 96/50 | HR 62 | Ht 72.0 in | Wt 221.6 lb

## 2017-03-16 DIAGNOSIS — I25118 Atherosclerotic heart disease of native coronary artery with other forms of angina pectoris: Secondary | ICD-10-CM | POA: Diagnosis not present

## 2017-03-16 DIAGNOSIS — I255 Ischemic cardiomyopathy: Secondary | ICD-10-CM

## 2017-03-16 DIAGNOSIS — I495 Sick sinus syndrome: Secondary | ICD-10-CM

## 2017-03-16 DIAGNOSIS — I209 Angina pectoris, unspecified: Secondary | ICD-10-CM

## 2017-03-16 DIAGNOSIS — Z950 Presence of cardiac pacemaker: Secondary | ICD-10-CM | POA: Diagnosis not present

## 2017-03-16 LAB — CUP PACEART INCLINIC DEVICE CHECK
Battery Voltage: 2.99 V
Brady Statistic RA Percent Paced: 63 %
Implantable Lead Implant Date: 20150916
Implantable Pulse Generator Implant Date: 20150916
Lead Channel Impedance Value: 450 Ohm
Lead Channel Pacing Threshold Amplitude: 0.75 V
Lead Channel Pacing Threshold Amplitude: 1.25 V
Lead Channel Pacing Threshold Pulse Width: 0.4 ms
Lead Channel Pacing Threshold Pulse Width: 0.4 ms
Lead Channel Sensing Intrinsic Amplitude: 1.9 mV
Lead Channel Setting Pacing Amplitude: 1.625
Lead Channel Setting Pacing Amplitude: 1.875
Lead Channel Setting Pacing Pulse Width: 0.4 ms
MDC IDC LEAD IMPLANT DT: 20150916
MDC IDC LEAD LOCATION: 753859
MDC IDC LEAD LOCATION: 753860
MDC IDC MSMT LEADCHNL RA PACING THRESHOLD PULSEWIDTH: 0.4 ms
MDC IDC MSMT LEADCHNL RV IMPEDANCE VALUE: 475 Ohm
MDC IDC MSMT LEADCHNL RV PACING THRESHOLD AMPLITUDE: 1.25 V
MDC IDC MSMT LEADCHNL RV SENSING INTR AMPL: 12 mV
MDC IDC SESS DTM: 20180521161904
MDC IDC SET LEADCHNL RV SENSING SENSITIVITY: 2 mV
MDC IDC STAT BRADY RV PERCENT PACED: 99.9 %
Pulse Gen Serial Number: 7659331

## 2017-03-16 MED ORDER — ISOSORBIDE MONONITRATE ER 60 MG PO TB24: 60.0000 mg | ORAL_TABLET | Freq: Every day | ORAL | 2 refills | Status: DC

## 2017-03-16 NOTE — Patient Instructions (Addendum)
Medication Instructions:    Your physician has recommended you make the following change in your medication: 1) INCREASE Imdur to 60 mg once daily  --- If you need a refill on your cardiac medications before your next appointment, please call your pharmacy. ---  Labwork:  None ordered  Testing/Procedures:  None ordered  Follow-Up: Remote monitoring is used to monitor your Pacemaker of ICD from home. This monitoring reduces the number of office visits required to check your device to one time per year. It allows [place] to keep an eye on the functioning of your device to ensure it is working properly. You are scheduled for a device check from home on 06/15/2017. You may send your transmission at any time that day. If you have a wireless device, the transmission will be sent automatically. After your physician reviews your transmission, you will receive a postcard with your next transmission date.   Your physician wants you to follow-up in: 6 months with Dr. Curt Bears.  You will receive a reminder letter in the mail two months in advance. If you don't receive a letter, please call our office to schedule the follow-up appointment.  Thank you for choosing [facility]!!   Trinidad Curet, RN [phone]

## 2017-03-16 NOTE — Progress Notes (Signed)
Electrophysiology Office Note   Date:  03/16/2017   ID:  Randall Callahan, DOB [birth year], MRN [id number]  PCP:  Dineen Kid, MD Primary Electrophysiologist:  Ziggy Reveles Meredith Leeds, MD    Chief Complaint  Patient presents with  . Follow-up    Sick sinus syndrome/ischemic cardiomyopathy     History of Present Illness: Randall Callahan is a 83 y.o. male who presents today for electrophysiology evaluation.   /He has a history of coronary artery disease status post stenting to the LAD and OM1 in 2003 with an occluded stent found in 2008, sick sinus syndrome status post dual-chamber pacemaker in 2015, hypertension, hyperlipidemia, type 2 diabetes. He had an an NSTEMI the beginning of Nov 2017 with a cath showing multivessel disease and no obvious targets for intervention. His medical management was optimized including beta blockers and long-acting nitrates. He was readmitted with an an STEMI. He did not have repeat catheterization at that time. His metoprolol was increased and he was put on heparin drip. His troponins increased to 0.24.   Today, denies symptoms of palpitations,  shortness of breath, orthopnea, PND, lower extremity edema, claudication, dizziness, presyncope, syncope, bleeding, or neurologic sequela. He is been having episodes of chest pain. They have occurred 3 days this month. He says that his chest pain occurs when he is exerting himself. It is also occurred when he is putting on his socks in the morning. He gets numbness in his arms as well as a pressure sensation in the center of his chest. He has been working with cardiac rehabilitation and has not had any chest pain in that setting. He does say that he has checked his pulse and blood pressure and has noted a pulse around 100 during these episodes. Of note he does not have any episodes recorded on his device.  Past Medical History:  Diagnosis Date  . Angina pectoris, unstable ([place])   . Angiomyolipoma of kidney   . ASCVD  (arteriosclerotic cardiovascular disease)   . Bradycardia   . Cardiomyopathy ([place])   . Chest pain   . Chest tightness   . Coronary artery disease   . Diabetes mellitus without complication ([place])   . Diverticulosis   . Hx of sick sinus syndrome   . Hypertension   . MCI (mild cognitive impairment)   . NSTEMI (non-ST elevation myocardial infarction) ([place])   . Peripheral neuropathy   . Schatzki's ring   . Squamous cell carcinoma in situ of skin    Past Surgical History:  Procedure Laterality Date  . CARDIAC CATHETERIZATION    . CORONARY ANGIOGRAPHY    . JOINT REPLACEMENT    . VASCULAR SURGERY       Current Outpatient Prescriptions  Medication Sig Dispense Refill  . aspirin EC 81 MG tablet Take 81 mg by mouth daily with breakfast.     . atorvastatin (LIPITOR) 40 MG tablet Take 40 mg by mouth at bedtime.     . bifidobacterium infantis (ALIGN) capsule Take 1 capsule by mouth daily with breakfast.    . bismuth subsalicylate (PEPTO BISMOL) 262 MG chewable tablet Chew 262-524 mg by mouth as needed for indigestion or diarrhea or loose stools.     . clopidogrel (PLAVIX) 75 MG tablet Take 75 mg by mouth daily with breakfast.     . isosorbide mononitrate (IMDUR) 30 MG 24 hr tablet Take 30 mg by mouth daily with breakfast.     . lisinopril (PRINIVIL,ZESTRIL) 2.5 MG tablet Take 2.5 mg by mouth daily  with breakfast.     . metFORMIN (GLUCOPHAGE) 1000 MG tablet Take 1,000 mg by mouth 2 (two) times daily with a meal.    . metoprolol succinate (TOPROL-XL) 50 MG 24 hr tablet Take 75 mg by mouth daily after breakfast. Take with or immediately following a meal.    . nitroGLYCERIN (NITROSTAT) 0.4 MG SL tablet Place 0.4 mg under the tongue every 5 (five) minutes as needed for chest pain. Up to 3 doses for chest pain. If pain has not subsided after 3 doses go to the ER.    . Omega-3 Fatty Acids (FISH OIL PO) Take 1,600 mg by mouth daily.    . pregabalin (LYRICA) 150 MG capsule Take 150 mg by mouth 2 (two)  times daily.    . Wheat Dextrin (BENEFIBER) POWD Take 15 mLs by mouth daily with breakfast. Take as directed      No current facility-administered medications for this visit.     Allergies:   Patient has no known allergies.   Social History:  The patient  reports that he has quit smoking. His smoking use included Cigarettes. He quit after 2.00 years of use. He has never used smokeless tobacco. He reports that he drinks about 1.2 - 1.8 oz of alcohol per week . He reports that he does not use drugs.   Family History:  The patient's family history includes Angina in his mother; Cancer in his brother; Other in his father; Prostate cancer in his brother; Stroke in his mother.    ROS:  Please see the history of present illness.   Otherwise, review of systems is positive for fatigue, chest pressure, cough, dyspnea on exertion, back pain, balance problems, bleeding, easy bruising.   All other systems are reviewed and negative.   PHYSICAL EXAM: VS:  BP (!) 96/50   Pulse 62   Ht 6' (1.829 m)   Wt 221 lb 9.6 oz (100.5 kg)   BMI 30.05 kg/m  , BMI Body mass index is 30.05 kg/m.  GEN: Well nourished, well developed, in no acute distress  HEENT: normal  Neck: no JVD, carotid bruits, or masses Cardiac: RRR; no murmurs, rubs, or gallops,no edema  Respiratory:  clear to auscultation bilaterally, normal work of breathing GI: soft, nontender, nondistended, + BS MS: no deformity or atrophy  Skin: warm and dry, device site well healed Neuro:  Strength and sensation are intact Psych: euthymic mood, full affect   EKG:  EKG is ordered today. Personal review of the ekg ordered 02/18/17 shows AV paced  Personal review of the device interrogation today. Results in [place]: 02/17/2017: ALT 17; BUN 16; Creatinine, Ser 1.18; Hemoglobin 14.3; Platelets 220; Potassium 4.3; Sodium 138    Lipid Panel  No results found for: CHOL, TRIG, HDL, CHOLHDL, VLDL, LDLCALC, LDLDIRECT   Wt Readings from  Last 3 Encounters:  03/16/17 221 lb 9.6 oz (100.5 kg)  01/08/17 218 lb (98.9 kg)  12/17/16 221 lb (100.2 kg)      Other studies Reviewed: Additional studies/ records that were reviewed today include: Transthoracic echo 11/21/16  Review of the above records today demonstrates: LV chamber size normal, basal septal hypertrophy, EF 44%. Normal RV global systolic function. Mild aortic regurgitation. Mild to moderate mitral regurgitation.  Cardiac cath 08/27/16  -Left main mild diffuse disease -LAD moderate diffuse disease, the mid segment of the LAD has a single discrete total occlusion. Distal flow was via collaterals from the RCA. The distal vessel was large. The  lesion represented in-stent restenosis following a prior coronary stent intervention. -Left circumflex: Mild diffuse disease in the entire vessel. There was a 90+ percent diffuse stenosis of the proximal segment of the first OM. The arm was moderate size. Distal flow was decreased and was via the native vessel and collaterals from the RCA.  -Right coronary artery: Mild diffuse disease. Moderate diffuse disease of the entire vessel segment of the right PDA.  -[place]: Moderate diffuse disease of the proximal segment.   ASSESSMENT AND PLAN:  1.  Coronary artery disease with stable angina: On aspirin and Plavix, long-acting nitrates, and metoprolol. Has been working with cardiac rehabilitation and doing well. He has unfortunately started having more episodes of chest discomfort. We Shonnie Poudrier increase his indoor to 60 mg.  2. Ischemic cardiomyopathy: Appears to be well compensated without evidence of volume overload. On optimal medical therapy. No medication changes at this time.  3. Sick sinus syndrome: St. Jude device functioning appropriately. No changes at this time.  4. Hyperlipidemia: Continue statin  Current medicines are reviewed at length with the patient today.   The patient does not have concerns regarding his medicines.  The  following changes were made today:  Increase Imdur to 60 mg  Labs/ tests ordered today include:  No orders of the defined types were placed in this encounter.    Disposition:   FU with [facility] [place] 6 months  Signed, Maleiyah Releford Meredith Leeds, MD  03/16/2017 12:12 PM     [address] [place] [address] [phone] (office) [phone] (fax)

## 2017-03-19 ENCOUNTER — Ambulatory Visit: Payer: Medicare Other | Attending: Neurology | Admitting: Rehabilitation

## 2017-03-19 ENCOUNTER — Encounter: Payer: Self-pay | Admitting: Rehabilitation

## 2017-03-19 DIAGNOSIS — R2689 Other abnormalities of gait and mobility: Secondary | ICD-10-CM

## 2017-03-19 DIAGNOSIS — R2681 Unsteadiness on feet: Secondary | ICD-10-CM | POA: Insufficient documentation

## 2017-03-19 DIAGNOSIS — M6281 Muscle weakness (generalized): Secondary | ICD-10-CM | POA: Insufficient documentation

## 2017-03-19 NOTE — Telephone Encounter (Signed)
Pt seen in office 5/21

## 2017-03-19 NOTE — Therapy (Signed)
[facility] [address] [place], [place], [address] Phone: [phone]   Fax:  [phone]  Physical Therapy Evaluation  Patient Details  Name: Offie Waide MRN: [id number] Date of Birth: 1928-07-16 Referring Provider: Marcial Pacas MD   Encounter Date: 03/19/2017      PT End of Session - 03/19/17 0919    Visit Number 1   Number of Visits 17   Date for PT Re-Evaluation 05/15/17   Authorization Type Medicare & G-codes every 10th visit    PT Start Time 0800   PT Stop Time 0845   PT Time Calculation (min) 45 min   Activity Tolerance Patient tolerated treatment well   Behavior During Therapy [facility] for tasks assessed/performed      Past Medical History:  Diagnosis Date  . Angina pectoris, unstable ([place])   . Angiomyolipoma of kidney   . ASCVD (arteriosclerotic cardiovascular disease)   . Bradycardia   . Cardiomyopathy ([place])   . Chest pain   . Chest tightness   . Coronary artery disease   . Diabetes mellitus without complication ([place])   . Diverticulosis   . Hx of sick sinus syndrome   . Hypertension   . MCI (mild cognitive impairment)   . NSTEMI (non-ST elevation myocardial infarction) ([place])   . Peripheral neuropathy   . Schatzki's ring   . Squamous cell carcinoma in situ of skin     Past Surgical History:  Procedure Laterality Date  . CARDIAC CATHETERIZATION    . CORONARY ANGIOGRAPHY    . JOINT REPLACEMENT    . VASCULAR SURGERY      There were no vitals filed for this visit.       Subjective Assessment - 03/19/17 0807    Subjective Patient is an 83 year old male presenting to PT due to imbalance, gait instability, and numbness/tingling in BLEs. Patient reports he is at PT due to "problems with his legs". Patient reports on two recent occasions, he stood to get out of the chair, and nearly fell due to the numbness in his bilateral LEs. Patient reports he believes the numbness in his LEs has gotten  worse over the past 5 years since his L TKA.      Pertinent History unstable angina, bradycardia, cardiomyopathy, chest pain, CAD, DM type II, diverticulosis, HTN, hyperlipidemia, mild cognitive impairment, non-ST elevation MI, pheripheral neuropathy   Limitations Lifting;Standing;Walking;House hold activities   Currently in Pain? Other (Comment)  numbness in bilateral LEs    Pain Relieving Factors low blood sugar level helps numbness feel less severe             [facility] PT Assessment - 03/19/17 0800      Assessment   Medical Diagnosis Gait instability    Referring Provider Marcial Pacas MD    Onset Date/Surgical Date 03/06/17  PT referral    Hand Dominance Right     Precautions   Precautions Fall     Balance Screen   Has the patient fallen in the past 6 months No  caught himself twice when rising from chair    Has the patient had a decrease in activity level because of a fear of falling?  No   Is the patient reluctant to leave their home because of a fear of falling?  No     Home Environment   Living Environment Private residence   Living Arrangements Children  son and daughter-in-law    Type of [place]  Access Stairs to enter   Entrance Stairs-Number of Steps 1   Entrance Stairs-Rails None   Home Layout One level     Prior Function   Level of Independence Independent  lives with son and daughter-in-law    Leisure getting out of the house to shop/run errands/out to eat      Sensation   Light Touch Impaired by gross assessment   Light Touch Impaired Details Impaired RUE;Impaired RLE;Impaired LLE  impaired RUE per patient report    Hot/Cold Not tested  impaired in BLEs per patient report    Hot/Cold Impaired Details --   Additional Comments RUE numbness, per patient report, has been present for 1-2 years. 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 He would "shake it out", and it helped  relieve the symptoms. Patient has a history of carpal tunnel and gout in R wrist. By gross assessment, light touch sensation intact in BUEs. Light touch sensation deficits in LLE > RLE, but numbness/tingling reported subjectively in BLEs.        Functional Tests   Functional tests Other;Other2     Other:   Other/ Comments Heel raises: patient able to perform 5 heel raises with bilateral LEs     Other:   Other/Comments Toe raises: patient unable to perform toe raises with bilateral LEs     Posture/Postural Control   Posture/Postural Control Postural limitations   Postural Limitations Rounded Shoulders;Forward head;Flexed trunk     Tone   Assessment Location Right Lower Extremity;Left Lower Extremity     ROM / Strength   AROM / PROM / Strength AROM;Strength     AROM   Overall AROM  Deficits   Overall AROM Comments Patient unable to achieve full knee flexion on L or R knee     Strength   Strength Assessment Site Hip;Knee;Ankle   Right/Left Hip Right;Left   Right Hip Flexion 5/5   Left Hip Flexion 5/5   Right/Left Knee Right;Left   Right Knee Flexion 4+/5   Right Knee Extension 4+/5  within available range of motion    Left Knee Flexion 4+/5   Left Knee Extension 4+/5  within available range of motion    Right/Left Ankle Right;Left   Right Ankle Dorsiflexion 5/5   Left Ankle Dorsiflexion 4+/5     Transfers   Transfers Sit to Stand;Stand to Sit   Sit to Stand 5: Supervision;With upper extremity assist;With armrests;From chair/3-in-1   Stand to Sit 5: Supervision;With upper extremity assist;With armrests;To chair/3-in-1     Ambulation/Gait   Ambulation/Gait Yes   Ambulation/Gait Assistance 5: Supervision   Ambulation Distance (Feet) 125 Feet   Assistive device None   Gait Pattern Step-to pattern;Decreased arm swing - right;Decreased arm swing - left;Decreased step length - right;Decreased step length - left;Decreased stride length;Right flexed knee in stance;Left flexed  knee in stance;Trunk flexed;Poor foot clearance - left;Poor foot clearance - right   Ambulation Surface Level;Indoor   Gait velocity 1.73ft/s   Stairs Yes   Stairs Assistance 5: Supervision   Stairs Assistance Details (indicate cue type and reason) Descends stairs with step to pattern, 1 rail, sideways. Ascends stairs with 2 rails, reciprocal gait pattern and forward.   Stair Management Technique One rail Left;Two rails;Alternating pattern;Step to pattern;Sideways;Forwards   Number of Stairs 4   Height of Stairs 6     Standardized Balance Assessment   Standardized Balance Assessment Five Times  Sit to Stand;10 meter walk test   Five times sit to stand comments  22.08   10 Meter Walk 1.20ft/s     Functional Gait  Assessment   Gait assessed  Yes   Gait Level Surface Walks 20 ft, slow speed, abnormal gait pattern, evidence for imbalance or deviates 10-15 in outside of the 12 in walkway width. Requires more than 7 sec to ambulate 20 ft.   Change in Gait Speed Makes only minor adjustments to walking speed, or accomplishes a change in speed with significant gait deviations, deviates 10-15 in outside the 12 in walkway width, or changes speed but loses balance but is able to recover and continue walking.   Gait with Horizontal Head Turns Performs head turns smoothly with slight change in gait velocity (eg, minor disruption to smooth gait path), deviates 6-10 in outside 12 in walkway width, or uses an assistive device.   Gait with Vertical Head Turns Performs task with slight change in gait velocity (eg, minor disruption to smooth gait path), deviates 6 - 10 in outside 12 in walkway width or uses assistive device   Gait and Pivot Turn Pivot turns safely in greater than 3 sec and stops with no loss of balance, or pivot turns safely within 3 sec and stops with mild imbalance, requires small steps to catch balance.   Step Over Obstacle Is able to step over one shoe box (4.5 in total height) without changing  gait speed. No evidence of imbalance.   Gait with Narrow Base of Support Ambulates 7-9 steps.   Gait with Eyes Closed Walks 20 ft, slow speed, abnormal gait pattern, evidence for imbalance, deviates 10-15 in outside 12 in walkway width. Requires more than 9 sec to ambulate 20 ft.   Ambulating Backwards Walks 20 ft, uses assistive device, slower speed, mild gait deviations, deviates 6-10 in outside 12 in walkway width.   Steps Two feet to a stair, must use rail.   Total Score 16   FGA comment: FGA scores < 19 = high risk fall     RLE Tone   RLE Tone Within Functional Limits     LLE Tone   LLE Tone Within Functional Limits                           PT Education - 03/19/17 0919    Education provided Yes   Education Details plan of care; interpretation of functional outcome measure results and fall risk    Person(s) Educated Patient   Methods Explanation   Comprehension Verbalized understanding          PT Short Term Goals - 03/19/17 1149      PT SHORT TERM GOAL #1   Title Patient will verbalize understanding and return demonstration of initial HEP to increase LE strength and balance. (TARGET DATE: 04/17/2017)    Time 4   Period Weeks   Status New     PT SHORT TERM GOAL #2   Title Patient will score >/= 19/30 on the Functional Gait Assessment in order to demonstrate a decrease in his risk of falling. (TARGET DATE: 04/17/2017)    Time 4   Period Weeks   Status New     PT SHORT TERM GOAL #3   Title Patient's gait velocity will be >/= 2.60ft/s to inidicate a true change in patient's gait velocity and a decrease in his risk of falling. (TARGET DATE: 04/17/2017)    Time 4  Period Weeks   Status New     PT SHORT TERM GOAL #4   Title Patient's 5 x STS will be </= 18s to indicate an increase in functional LE strength and balance. (TARGET DATE: 04/17/2017)    Time 4   Period Weeks   Status New     PT SHORT TERM GOAL #5   Title Patient will be able to complete  stairs with 2 rails and a reciprocal gait pattern (ascending and descending stairs) with supervision to indicate an increase in functional LE strength. (TARGET DATE: 04/17/2017)    Time 4   Period Weeks   Status New     Additional Short Term Goals   Additional Short Term Goals Yes     PT SHORT TERM GOAL #6   Title PT will perform 6 Minute Walk Test, and patient will increase from baseline distance by >/= 150 feet to indicate an increase in his endurance. (TARGET DATE: 04/17/2017)    Time 4   Period Weeks   Status New           PT Long Term Goals - 03/19/17 1002      PT LONG TERM GOAL #1   Title Patient will verbalize understanding of ongoing HEP to increase LE strength and balance. (TARGET DATE: 05/15/2017)   Time 8   Period Weeks   Status New     PT LONG TERM GOAL #2   Title Patient's gait velocity will be >/= 2.40ft/s to indicate safety in community ambulation. (TARGET DATE: 05/15/2017)    Time 8   Period Weeks   Status New     PT LONG TERM GOAL #3   Title Patient's 5 x STS time will be </= 15 seconds with decreased reliance on momentum to perform transfer to indicate a decrease in his risk for recurrent falls. (TARGET DATE: 05/15/2017)    Time 8   Period Weeks   Status New     PT LONG TERM GOAL #4   Title Patient will score >/= 23/30 on the Functional Gait Assessment to indicate a decreaes in his risk of recurrent falls. (TARGET DATE: 05/15/2017)    Time 8   Period Weeks   Status New     PT LONG TERM GOAL #5   Title Patient will demonstrate ability to perform stairs with 1 rail with reciprocal gait pattern (ascending and descending) with modified independence to indicate an increase in functional LE strength and a decrease in his risk of falling. (TARGET DATE: 05/15/2017)   Time 8   Period Weeks   Status New     Additional Long Term Goals   Additional Long Term Goals Yes     PT LONG TERM GOAL #6   Title PT will complete 6 Minute Walk Test, and patient will increase  from baseline distance by >/= 328ft to indicate an increase in his endurance. (TARGET DATE: 05/15/2017)    Time 8   Period Weeks   Status New               Plan - 03/19/17 0926    Clinical Impression Statement Patient is an 83 year old male presenting to OPPT neuro with an evolving clinical presentation for a moderate complexity PT evaluation due to numbness and tingling in BLEs resulting in gait instability and imbalance. The following deficits were noted during the patient's exam: decreased sensation in LLE placing the patient at an increased risk for falling, decreased LE strength demonstrated in MMT and  functional strength testing placing him at an increased risk for falling, gait mechanic abnormalities placing him at an increased risk for falling, and decreased range of motion in bilateral LEs, which is impacting his LE strength and gait mechanics, placing him at an increased risk for falling. The patient's 5 x STS score of 22.08s, Functional Gait Assessment score of 16/30, and gait velocity of 1.6ft/s all indicate he is at an increased risk for repeated falls. At the end of the PT's evaluation, patient subjectively reported that the increase in activity throughout the evaluation had decreased the tingling sensation in his LEs. Patient would benefit from skilled PT to address these impairments and functional limitations to maximize functional mobility independence and reduce falls risk.     Rehab Potential Good   Clinical Impairments Affecting Rehab Potential unstable angina, bradycardia, cardiomyopathy, chest pain, CAD, DM type II, diverticulosis, HTN, hyperlipidemia, mild cognitive impairment, non-ST elevation MI, pheripheral neuropathy    PT Frequency 2x / week   PT Duration 8 weeks   PT Treatment/Interventions ADLs/Self Care Home Management;Neuromuscular re-education;Balance training;Therapeutic activities;Functional mobility training;Stair training;Gait training;DME  Instruction;Patient/family education;Manual techniques;Passive range of motion;Energy conservation   PT Next Visit Plan initiate HEP for LE strengthening and balance; perform 6 Minute Walk Test    Consulted and Agree with Plan of Care Patient      Patient will benefit from skilled therapeutic intervention in order to improve the following deficits and impairments:  Abnormal gait, Decreased activity tolerance, Decreased balance, Decreased range of motion, Decreased mobility, Decreased knowledge of use of DME, Decreased knowledge of precautions, Decreased endurance, Decreased strength, Difficulty walking, Impaired sensation, Postural dysfunction  Visit Diagnosis: Unsteadiness on feet  Other abnormalities of gait and mobility  Muscle weakness (generalized)      G-Codes - 2017/03/20 1159    Functional Assessment Tool Used (Outpatient Only) Functional Gait Assessment = 16/30; Gait velocity = 1.10ft/s; 5 Time Sit to Stand Test = 22.08s    Functional Limitation Mobility: Walking and moving around   Mobility: Walking and Moving Around Current Status [phone]) At least 40 percent but less than 60 percent impaired, limited or restricted   Mobility: Walking and Moving Around Goal Status [phone]) At least 20 percent but less than 40 percent impaired, limited or restricted       Problem List Patient Active Problem List   Diagnosis Date Noted  . Gait abnormality 01/08/2017  . Neck pain 01/08/2017  . Low back pain 01/08/2017  . Ischemic cardiomyopathy 12/17/2016  . Coronary artery disease involving native coronary artery of native heart with angina pectoris ([place]) 12/17/2016  . Type 2 diabetes mellitus without complications ([place]) 14/23/9532  . Hyperlipidemia 12/17/2016    Arelia Sneddon, SPT  Mar 20, 2017, 4:18 PM  [facility] [address] [place], [place], [address] Phone: [phone]   Fax:  [phone]  Name: Amos Micheals MRN: [id number] Date of Birth: [birth year]

## 2017-04-03 ENCOUNTER — Encounter: Payer: Self-pay | Admitting: Rehabilitation

## 2017-04-03 ENCOUNTER — Telehealth: Payer: Self-pay | Admitting: Neurology

## 2017-04-03 ENCOUNTER — Ambulatory Visit: Payer: Medicare Other | Attending: Neurology | Admitting: Rehabilitation

## 2017-04-03 DIAGNOSIS — M6281 Muscle weakness (generalized): Secondary | ICD-10-CM | POA: Diagnosis present

## 2017-04-03 DIAGNOSIS — R2689 Other abnormalities of gait and mobility: Secondary | ICD-10-CM | POA: Diagnosis present

## 2017-04-03 DIAGNOSIS — R2681 Unsteadiness on feet: Secondary | ICD-10-CM | POA: Diagnosis not present

## 2017-04-03 NOTE — Therapy (Signed)
[facility] [address] [place], [place], [address] Phone: [phone]   Fax:  [phone]  Physical Therapy Treatment  Patient Details  Name: Randall Callahan MRN: [id number] Date of Birth: 1928-06-29 Referring Provider: Marcial Pacas MD   Encounter Date: 04/03/2017      PT End of Session - 04/03/17 0804    Visit Number 2   Number of Visits 17   Date for PT Re-Evaluation 05/15/17   Authorization Type Medicare & G-codes every 10th visit    PT Start Time 0800   PT Stop Time 0844   PT Time Calculation (min) 44 min   Activity Tolerance Patient tolerated treatment well   Behavior During Therapy [facility] for tasks assessed/performed      Past Medical History:  Diagnosis Date  . Angina pectoris, unstable ([place])   . Angiomyolipoma of kidney   . ASCVD (arteriosclerotic cardiovascular disease)   . Bradycardia   . Cardiomyopathy ([place])   . Chest pain   . Chest tightness   . Coronary artery disease   . Diabetes mellitus without complication ([place])   . Diverticulosis   . Hx of sick sinus syndrome   . Hypertension   . MCI (mild cognitive impairment)   . NSTEMI (non-ST elevation myocardial infarction) ([place])   . Peripheral neuropathy   . Schatzki's ring   . Squamous cell carcinoma in situ of skin     Past Surgical History:  Procedure Laterality Date  . CARDIAC CATHETERIZATION    . CORONARY ANGIOGRAPHY    . JOINT REPLACEMENT    . VASCULAR SURGERY      There were no vitals filed for this visit.      Subjective Assessment - 04/03/17 0803    Subjective Pt reports no changes, no falls.  Does state that he continues to lose flexibility in L knee.     Pertinent History unstable angina, bradycardia, cardiomyopathy, chest pain, CAD, DM type II, diverticulosis, HTN, hyperlipidemia, mild cognitive impairment, non-ST elevation MI, pheripheral neuropathy   Limitations Lifting;Standing;Walking;House hold activities   Currently in  Pain? No/denies            [facility] PT Assessment - 04/03/17 0806      6 Minute Walk- Baseline   6 Minute Walk- Baseline yes   HR (bpm) 60   02 Sat (%RA) 95 %   Modified Borg Scale for Dyspnea 0- Nothing at all   Perceived Rate of Exertion ([place]org) 6-     6 Minute walk- Post Test   6 Minute Walk Post Test yes   HR (bpm) 86   02 Sat (%RA) 94 %   Modified Borg Scale for Dyspnea 2- Mild shortness of breath   Perceived Rate of Exertion ([place]) 11- Fairly light     6 minute walk test results    Aerobic Endurance Distance Walked 658   Endurance additional comments no device, no LOB but does tend to veer to the R.                       [facility] Adult PT Treatment/Exercise - 04/03/17 0001      Exercises   Exercises Other Exercises   Other Exercises  Performed and provided BLE strengthening and flexibility exercises for HEP.  See pt instruction for details on exercises and reps performed.  Also focused on posture with sit<>stand and wall bumps as he tends to bias weight forward onto toes with forward flexed posture.  PT Education - 04/03/17 0803    Education provided Yes   Education Details education on walking program and HEP for BLE strength/flexibility   Person(s) Educated Patient   Methods Explanation   Comprehension Verbalized understanding          PT Short Term Goals - 03/19/17 1149      PT SHORT TERM GOAL #1   Title Patient will verbalize understanding and return demonstration of initial HEP to increase LE strength and balance. (TARGET DATE: 04/17/2017)    Time 4   Period Weeks   Status New     PT SHORT TERM GOAL #2   Title Patient will score >/= 19/30 on the Functional Gait Assessment in order to demonstrate a decrease in his risk of falling. (TARGET DATE: 04/17/2017)    Time 4   Period Weeks   Status New     PT SHORT TERM GOAL #3   Title Patient's gait velocity will be >/= 2.27ft/s to inidicate a true change in patient's gait  velocity and a decrease in his risk of falling. (TARGET DATE: 04/17/2017)    Time 4   Period Weeks   Status New     PT SHORT TERM GOAL #4   Title Patient's 5 x STS will be </= 18s to indicate an increase in functional LE strength and balance. (TARGET DATE: 04/17/2017)    Time 4   Period Weeks   Status New     PT SHORT TERM GOAL #5   Title Patient will be able to complete stairs with 2 rails and a reciprocal gait pattern (ascending and descending stairs) with supervision to indicate an increase in functional LE strength. (TARGET DATE: 04/17/2017)    Time 4   Period Weeks   Status New     Additional Short Term Goals   Additional Short Term Goals Yes     PT SHORT TERM GOAL #6   Title PT will perform 6 Minute Walk Test, and patient will increase from baseline distance by >/= 150 feet to indicate an increase in his endurance. (TARGET DATE: 04/17/2017)    Time 4   Period Weeks   Status New           PT Long Term Goals - 03/19/17 1002      PT LONG TERM GOAL #1   Title Patient will verbalize understanding of ongoing HEP to increase LE strength and balance. (TARGET DATE: 05/15/2017)   Time 8   Period Weeks   Status New     PT LONG TERM GOAL #2   Title Patient's gait velocity will be >/= 2.22ft/s to indicate safety in community ambulation. (TARGET DATE: 05/15/2017)    Time 8   Period Weeks   Status New     PT LONG TERM GOAL #3   Title Patient's 5 x STS time will be </= 15 seconds with decreased reliance on momentum to perform transfer to indicate a decrease in his risk for recurrent falls. (TARGET DATE: 05/15/2017)    Time 8   Period Weeks   Status New     PT LONG TERM GOAL #4   Title Patient will score >/= 23/30 on the Functional Gait Assessment to indicate a decreaes in his risk of recurrent falls. (TARGET DATE: 05/15/2017)    Time 8   Period Weeks   Status New     PT LONG TERM GOAL #5   Title Patient will demonstrate ability to perform stairs with 1 rail with reciprocal  gait pattern (ascending and descending) with modified independence to indicate an increase in functional LE strength and a decrease in his risk of falling. (TARGET DATE: 05/15/2017)   Time 8   Period Weeks   Status New     Additional Long Term Goals   Additional Long Term Goals Yes     PT LONG TERM GOAL #6   Title PT will complete 6 Minute Walk Test, and patient will increase from baseline distance by >/= 315ft to indicate an increase in his endurance. (TARGET DATE: 05/15/2017)    Time 8   Period Weeks   Status New               Plan - 04/03/17 0804    Clinical Impression Statement Skilled session focused on assessment of functional endurance with 6MWT.  Note distance of only 91' which is about half of age matched normal.  He demos very slow gait speed with tendency to land into plantar flexion during initial contact.  Provided HEP for BLE strength and flexibility to address ankle and hamstring length.  tolerated well, see pt instruction for details.     Rehab Potential Good   Clinical Impairments Affecting Rehab Potential unstable angina, bradycardia, cardiomyopathy, chest pain, CAD, DM type II, diverticulosis, HTN, hyperlipidemia, mild cognitive impairment, non-ST elevation MI, pheripheral neuropathy    PT Frequency 2x / week   PT Duration 8 weeks   PT Treatment/Interventions ADLs/Self Care Home Management;Neuromuscular re-education;Balance training;Therapeutic activities;Functional mobility training;Stair training;Gait training;DME Instruction;Patient/family education;Manual techniques;Passive range of motion;Energy conservation   PT Next Visit Plan check compliance with HEP, gastroc stretch prior to ambulation, activities to work on heel to toe contact with gait, larger stride length, posture   Consulted and Agree with Plan of Care Patient      Patient will benefit from skilled therapeutic intervention in order to improve the following deficits and impairments:  Abnormal gait,  Decreased activity tolerance, Decreased balance, Decreased range of motion, Decreased mobility, Decreased knowledge of use of DME, Decreased knowledge of precautions, Decreased endurance, Decreased strength, Difficulty walking, Impaired sensation, Postural dysfunction  Visit Diagnosis: Unsteadiness on feet  Other abnormalities of gait and mobility  Muscle weakness (generalized)     Problem List Patient Active Problem List   Diagnosis Date Noted  . Gait abnormality 01/08/2017  . Neck pain 01/08/2017  . Low back pain 01/08/2017  . Ischemic cardiomyopathy 12/17/2016  . Coronary artery disease involving native coronary artery of native heart with angina pectoris ([place]) 12/17/2016  . Type 2 diabetes mellitus without complications ([place]) 68/12/2120  . Hyperlipidemia 12/17/2016   Cameron Sprang, PT, MPT [facility] [address] [place], [place], [address] Phone: [phone]   Fax:  [phone] 04/03/17, 11:14 AM  Name: Vikas Wegmann MRN: [id number] Date of Birth: 13-Jun-1928

## 2017-04-03 NOTE — Telephone Encounter (Signed)
Patient's daughter in law in lobby requesting brace for carpal tunnel. He is next door until 8:45. Last visit with Dr. Krista Blue they were supposed to get one and have not. Best call back is [phone]

## 2017-04-03 NOTE — Patient Instructions (Signed)
WALKING  Walking is a great form of exercise to increase your strength, endurance and overall fitness.  A walking program can help you start slowly and gradually build endurance as you go.  Everyone's ability is different, so each person's starting point will be different.  You do not have to follow them exactly.  The are just samples. You should simply find out what's right for you and stick to that program.   In the beginning, you'll start off walking 2-3 times a day for short distances.  As you get stronger, you'll be walking further at just 1-2 times per day.  A. You Can Walk For A Certain Length Of Time Each Day    Walk 6 minutes 2-3 times per day.  Increase 1 minutes every 7 days (2 times per day).  Work up to 25-30 minutes (1-2 times per day).   Example:   Day 1-2 6 minutes 2-3 times per day   Day 7-8 7 minutes 2-3 times per day   Day 13-14 8 minutes 1-2 times per day  B. You Can Walk For a Certain Distance Each Day     Distance can be substituted for time.    Example:   3 trips to mailbox (at road)   3 trips to corner of block   3 trips around the block  C. Go to local [employer] and use the track.    Walk for distance ____ around track  Or time ____ minutes  Please only do the exercises that your therapist has initialed and dated  Chair Sitting    Sit at edge of seat, spine straight, one leg extended. Put a hand on each thigh and bend forward from the hip, keeping spine straight and keep toes up. Allow hand on extended leg to reach toward toes. Support upper body with other arm. Hold _60__ seconds. Repeat _3__ times per session. Do _2__ sessions per day.  Copyright  VHI. All rights reserved.   Achilles Tendon Stretch    Stand with hands supported on wall, elbows slightly bent, feet parallel and both heels on floor, front knee bent, back knee straight. Slowly relax back knee until a stretch is felt in achilles tendon. Hold _60___ seconds. Repeat with leg  positions switched.  Do 3 times on each side and do 2 times per day.    Copyright  VHI. All rights reserved.   Functional Quadriceps: Sit to Stand    Sit on edge of chair, feet flat on floor. Stand upright, extending knees fully. Repeat _10___ times per set. Do _1___ sets per session. Do __2__ sessions per day.  http://orth.exer.us/734   Copyright  VHI. All rights reserved.    Weight Shift: Anterior / Posterior (Righting / Equilibrium)    BEGIN WITH BACK LEANING AGAINST THE WALL AND FEET 4 INCHES AWAY.  DO THIS STANDING ON PILLOW FOR INCREASED CHALLENGE AND HAVE CHAIR IN FRONT OF YOU! Move your hips off the wall and come to upright standing.  Hold for 5 seconds.  Get as tall as you can (hips forward, chest back) Return slowly to the wall letting your hips bump the wall and return to stand.   Hold each position __5__ seconds. Repeat _10__ times per session. Do _2__ sessions per day.

## 2017-04-06 ENCOUNTER — Other Ambulatory Visit: Payer: Self-pay

## 2017-04-06 ENCOUNTER — Ambulatory Visit: Payer: Medicare Other | Admitting: Rehabilitation

## 2017-04-06 ENCOUNTER — Telehealth: Payer: Self-pay | Admitting: Neurology

## 2017-04-06 ENCOUNTER — Encounter: Payer: Self-pay | Admitting: Rehabilitation

## 2017-04-06 DIAGNOSIS — R2689 Other abnormalities of gait and mobility: Secondary | ICD-10-CM

## 2017-04-06 DIAGNOSIS — G5601 Carpal tunnel syndrome, right upper limb: Secondary | ICD-10-CM

## 2017-04-06 DIAGNOSIS — G5603 Carpal tunnel syndrome, bilateral upper limbs: Secondary | ICD-10-CM

## 2017-04-06 DIAGNOSIS — M6281 Muscle weakness (generalized): Secondary | ICD-10-CM

## 2017-04-06 DIAGNOSIS — R2681 Unsteadiness on feet: Secondary | ICD-10-CM | POA: Diagnosis not present

## 2017-04-06 NOTE — Telephone Encounter (Signed)
DME order for carpal tunnel done for patient.

## 2017-04-06 NOTE — Therapy (Signed)
[facility] [address] [place], [place], [address] Phone: [phone]   Fax:  [phone]  Physical Therapy Treatment  Patient Details  Name: Randall Callahan MRN: [id number] Date of Birth: Sep 27, 1928 Referring Provider: Marcial Pacas MD   Encounter Date: 04/06/2017      PT End of Session - 04/06/17 1337    Visit Number 3   Number of Visits 17   Date for PT Re-Evaluation 05/15/17   Authorization Type Medicare & G-codes every 10th visit    PT Start Time 0848   PT Stop Time 0930   PT Time Calculation (min) 42 min   Activity Tolerance Patient tolerated treatment well   Behavior During Therapy [facility] for tasks assessed/performed      Past Medical History:  Diagnosis Date  . Angina pectoris, unstable ([place])   . Angiomyolipoma of kidney   . ASCVD (arteriosclerotic cardiovascular disease)   . Bradycardia   . Cardiomyopathy ([place])   . Chest pain   . Chest tightness   . Coronary artery disease   . Diabetes mellitus without complication ([place])   . Diverticulosis   . Hx of sick sinus syndrome   . Hypertension   . MCI (mild cognitive impairment)   . NSTEMI (non-ST elevation myocardial infarction) ([place])   . Peripheral neuropathy   . Schatzki's ring   . Squamous cell carcinoma in situ of skin     Past Surgical History:  Procedure Laterality Date  . CARDIAC CATHETERIZATION    . CORONARY ANGIOGRAPHY    . JOINT REPLACEMENT    . VASCULAR SURGERY      There were no vitals filed for this visit.      Subjective Assessment - 04/06/17 0854    Subjective Pt reports doing stretches some over the weekend, but had some breathing troubles, did not take nitro due to no chest pain.    Pertinent History unstable angina, bradycardia, cardiomyopathy, chest pain, CAD, DM type II, diverticulosis, HTN, hyperlipidemia, mild cognitive impairment, non-ST elevation MI, pheripheral neuropathy   Limitations Lifting;Standing;Walking;House hold  activities   Currently in Pain? No/denies                         [facility] Adult PT Treatment/Exercise - 04/06/17 0001      Ambulation/Gait   Ambulation/Gait Yes   Ambulation/Gait Assistance 5: Supervision   Ambulation/Gait Assistance Details Worked on gait with and without walking poles (utilized to improve arm swing by therapist holding).  Performed 52' with cues for upright posture, imporved stride length with heel to toe contact.  Note marked improvement with use of walking poles.  Cues to maintain once leaving clinic.    Ambulation Distance (Feet) 345 Feet   Assistive device None   Gait Pattern Step-to pattern;Decreased arm swing - right;Decreased arm swing - left;Decreased step length - right;Decreased step length - left;Decreased stride length;Right flexed knee in stance;Left flexed knee in stance;Trunk flexed;Poor foot clearance - left;Poor foot clearance - right   Ambulation Surface Level;Indoor     Neuro Re-ed    Neuro Re-ed Details  stepping tasks at counter top to promote improved stride length and posture with forward/retro stepping with shoulder flex w/ forward stepping and shoulder extension with retro stepping.  tolerated well with cues for forward gaze.  Performed x 10 reps on each side.       Exercises   Exercises Other Exercises   Other Exercises  supine pectoral stretch over towel  roll x 2 sets of 2 mins.  Standing trunk and cervical extension against wall with towel roll to promote improved cervical retraction x 2 reps of 1 min.  tolerated well.   Seated nustep x 5 mins at level 4 resistance with BUE/LEs for improved endurance.  cues to maintain steps per min in 70's.  HR following task was 71 bpm.   Note goal at pulmonary rehab is 70-80, therefore would like to see workload increased to get closer to 80.                 PT Education - 04/06/17 1336    Education provided Yes   Education Details increasing workload at pulmonary rehab, added supine  pectoral stretch    Person(s) Educated Patient   Methods Explanation;Demonstration;Handout   Comprehension Verbalized understanding          PT Short Term Goals - 03/19/17 1149      PT SHORT TERM GOAL #1   Title Patient will verbalize understanding and return demonstration of initial HEP to increase LE strength and balance. (TARGET DATE: 04/17/2017)    Time 4   Period Weeks   Status New     PT SHORT TERM GOAL #2   Title Patient will score >/= 19/30 on the Functional Gait Assessment in order to demonstrate a decrease in his risk of falling. (TARGET DATE: 04/17/2017)    Time 4   Period Weeks   Status New     PT SHORT TERM GOAL #3   Title Patient's gait velocity will be >/= 2.35ft/s to inidicate a true change in patient's gait velocity and a decrease in his risk of falling. (TARGET DATE: 04/17/2017)    Time 4   Period Weeks   Status New     PT SHORT TERM GOAL #4   Title Patient's 5 x STS will be </= 18s to indicate an increase in functional LE strength and balance. (TARGET DATE: 04/17/2017)    Time 4   Period Weeks   Status New     PT SHORT TERM GOAL #5   Title Patient will be able to complete stairs with 2 rails and a reciprocal gait pattern (ascending and descending stairs) with supervision to indicate an increase in functional LE strength. (TARGET DATE: 04/17/2017)    Time 4   Period Weeks   Status New     Additional Short Term Goals   Additional Short Term Goals Yes     PT SHORT TERM GOAL #6   Title PT will perform 6 Minute Walk Test, and patient will increase from baseline distance by >/= 150 feet to indicate an increase in his endurance. (TARGET DATE: 04/17/2017)    Time 4   Period Weeks   Status New           PT Long Term Goals - 03/19/17 1002      PT LONG TERM GOAL #1   Title Patient will verbalize understanding of ongoing HEP to increase LE strength and balance. (TARGET DATE: 05/15/2017)   Time 8   Period Weeks   Status New     PT LONG TERM GOAL #2    Title Patient's gait velocity will be >/= 2.78ft/s to indicate safety in community ambulation. (TARGET DATE: 05/15/2017)    Time 8   Period Weeks   Status New     PT LONG TERM GOAL #3   Title Patient's 5 x STS time will be </= 15 seconds with decreased reliance on  momentum to perform transfer to indicate a decrease in his risk for recurrent falls. (TARGET DATE: 05/15/2017)    Time 8   Period Weeks   Status New     PT LONG TERM GOAL #4   Title Patient will score >/= 23/30 on the Functional Gait Assessment to indicate a decreaes in his risk of recurrent falls. (TARGET DATE: 05/15/2017)    Time 8   Period Weeks   Status New     PT LONG TERM GOAL #5   Title Patient will demonstrate ability to perform stairs with 1 rail with reciprocal gait pattern (ascending and descending) with modified independence to indicate an increase in functional LE strength and a decrease in his risk of falling. (TARGET DATE: 05/15/2017)   Time 8   Period Weeks   Status New     Additional Long Term Goals   Additional Long Term Goals Yes     PT LONG TERM GOAL #6   Title PT will complete 6 Minute Walk Test, and patient will increase from baseline distance by >/= 342ft to indicate an increase in his endurance. (TARGET DATE: 05/15/2017)    Time 8   Period Weeks   Status New               Plan - 04/06/17 1337    Clinical Impression Statement Skilled session focused on improved posture with exercises to stretch anterior chest and strengthen posterior trunk as well as exercises to improve posture and gait quality.  Tolerated well.     Rehab Potential Good   Clinical Impairments Affecting Rehab Potential unstable angina, bradycardia, cardiomyopathy, chest pain, CAD, DM type II, diverticulosis, HTN, hyperlipidemia, mild cognitive impairment, non-ST elevation MI, pheripheral neuropathy    PT Frequency 2x / week   PT Duration 8 weeks   PT Treatment/Interventions ADLs/Self Care Home Management;Neuromuscular  re-education;Balance training;Therapeutic activities;Functional mobility training;Stair training;Gait training;DME Instruction;Patient/family education;Manual techniques;Passive range of motion;Energy conservation   PT Next Visit Plan check compliance with HEP, gastroc stretch prior to ambulation, activities to work on heel to toe contact with gait, larger stride length, posture   Consulted and Agree with Plan of Care Patient      Patient will benefit from skilled therapeutic intervention in order to improve the following deficits and impairments:  Abnormal gait, Decreased activity tolerance, Decreased balance, Decreased range of motion, Decreased mobility, Decreased knowledge of use of DME, Decreased knowledge of precautions, Decreased endurance, Decreased strength, Difficulty walking, Impaired sensation, Postural dysfunction  Visit Diagnosis: Unsteadiness on feet  Other abnormalities of gait and mobility  Muscle weakness (generalized)     Problem List Patient Active Problem List   Diagnosis Date Noted  . Gait abnormality 01/08/2017  . Neck pain 01/08/2017  . Low back pain 01/08/2017  . Ischemic cardiomyopathy 12/17/2016  . Coronary artery disease involving native coronary artery of native heart with angina pectoris ([place]) 12/17/2016  . Type 2 diabetes mellitus without complications ([place]) 82/99/3716  . Hyperlipidemia 12/17/2016    Cameron Sprang, PT, MPT [facility] [address] [place], [place], [address] Phone: [phone]   Fax:  [phone] 04/06/17, 1:39 PM  Name: Randall Callahan MRN: [id number] Date of Birth: July 13, 1928

## 2017-04-06 NOTE — Telephone Encounter (Signed)
DME order revise for patient. Per daughter only need for right hand/arm. DME order form given to daughter. Rn explain to daughter in lobby it can be given to any DME medical supply store.Daughter verbalized understanding.

## 2017-04-06 NOTE — Patient Instructions (Signed)
    pectoral and postural stretch on towel roll.   Position yourself on towel roll so you are supported from head through the buttocks. Add extra towel or pillow as needed for neck comfort.  A) position arms in a 'T' and let them relax to stretch chest muscles.  Work your way as able to a "Y" shape.    Hold position for 2 mins and then relax.  Repeat one more time.  Do 2 times per day.

## 2017-04-06 NOTE — Telephone Encounter (Signed)
DME order for brace at front desk with patients name on it for pick up.

## 2017-04-10 ENCOUNTER — Encounter: Payer: Self-pay | Admitting: Physical Therapy

## 2017-04-10 ENCOUNTER — Ambulatory Visit: Payer: Medicare Other | Admitting: Physical Therapy

## 2017-04-10 DIAGNOSIS — R2681 Unsteadiness on feet: Secondary | ICD-10-CM | POA: Diagnosis not present

## 2017-04-10 DIAGNOSIS — M6281 Muscle weakness (generalized): Secondary | ICD-10-CM

## 2017-04-10 DIAGNOSIS — R2689 Other abnormalities of gait and mobility: Secondary | ICD-10-CM

## 2017-04-11 NOTE — Therapy (Signed)
[name] [address] [place], [place], [address] Phone: [phone]   Fax:  [phone]  Physical Therapy Treatment  Patient Details  Name: Randall Callahan MRN: [id number] Date of Birth: Jan 17, 1928 Referring Provider: Marcial Pacas MD   Encounter Date: 04/10/2017      PT End of Session - 04/10/17 1106    Visit Number 4   Number of Visits 17   Date for PT Re-Evaluation 05/15/17   Authorization Type Medicare & G-codes every 10th visit    PT Start Time 1104   PT Stop Time 1143   PT Time Calculation (min) 39 min   Equipment Utilized During Treatment Gait belt   Activity Tolerance Patient tolerated treatment well   Behavior During Therapy [facility] for tasks assessed/performed      Past Medical History:  Diagnosis Date  . Angina pectoris, unstable ([place])   . Angiomyolipoma of kidney   . ASCVD (arteriosclerotic cardiovascular disease)   . Bradycardia   . Cardiomyopathy ([place])   . Chest pain   . Chest tightness   . Coronary artery disease   . Diabetes mellitus without complication ([place])   . Diverticulosis   . Hx of sick sinus syndrome   . Hypertension   . MCI (mild cognitive impairment)   . NSTEMI (non-ST elevation myocardial infarction) ([place])   . Peripheral neuropathy   . Schatzki's ring   . Squamous cell carcinoma in situ of skin     Past Surgical History:  Procedure Laterality Date  . CARDIAC CATHETERIZATION    . CORONARY ANGIOGRAPHY    . JOINT REPLACEMENT    . VASCULAR SURGERY      There were no vitals filed for this visit.      Subjective Assessment - 04/10/17 1105    Subjective No new complaints. No falls or pain to report. Did do a lot of walking at grand-daughter's graduation at [employer]. Was tired afterwards.    Pertinent History unstable angina, bradycardia, cardiomyopathy, chest pain, CAD, DM type II, diverticulosis, HTN, hyperlipidemia, mild cognitive impairment, non-ST elevation MI, pheripheral  neuropathy   Limitations Lifting;Standing;Walking;House hold activities   Currently in Pain? No/denies             [facility] Adult PT Treatment/Exercise - 04/10/17 1111      Transfers   Transfers Sit to Stand;Stand to Sit   Sit to Stand 5: Supervision;With upper extremity assist;With armrests;From chair/3-in-1   Stand to Sit 5: Supervision;With upper extremity assist;With armrests;To chair/3-in-1     Ambulation/Gait   Ambulation/Gait Yes   Ambulation/Gait Assistance 5: Supervision   Ambulation/Gait Assistance Details verbal and demo cues for increased step length, posture and reciprocal arm swing with gait.    Ambulation Distance (Feet) 115 Feet  x2 reps, ladder activity between   Assistive device None   Gait Pattern Step-to pattern;Decreased arm swing - right;Decreased arm swing - left;Decreased step length - right;Decreased step length - left;Decreased stride length;Right flexed knee in stance;Left flexed knee in stance;Trunk flexed;Poor foot clearance - left;Poor foot clearance - right   Ambulation Surface Level;Indoor   Pre-Gait Activities ladder with flat white slats on floor next to counter top: reciprocal stepping between slats x 6 laps to work on increased step length with intermittent UE support on counter top for balance. Carryover noted with gait around gym with minimal cues needed.  Neuro Re-ed    Neuro Re-ed Details  on balance board in parallel bars in anterior/posterior directions: EO rocking board with emphasis on tall posture and ankle use only for motions; holding board steady: alternating UE raises, bil UE raises, shoulder horizontal abduction and angel wings x 10 reps each with min assist for balance and cues on posture, ex form and weight shifting to assist balance.                     Knee/Hip Exercises: Machines for Strengthening   Cybex Leg Press 70# for bil legs with 5 sec holds, 2 sets of 10 reps; 40# single leg's 5 sec holds,  2 sets of 10 reps each leg.      Knee/Hip Exercises: Standing   Forward Step Up Both;1 set;10 reps;Hand Hold: 1;Step Height: 6"   Forward Step Up Limitations light UE support with cues to use legs more than arms and for slow, controlled motions   Step Down Both;1 set;10 reps;Hand Hold: 1;Step Height: 6";Limitations   Step Down Limitations light UE support: cues for increased LE use and slow, controlled motions           PT Short Term Goals - 03/19/17 1149      PT SHORT TERM GOAL #1   Title Patient will verbalize understanding and return demonstration of initial HEP to increase LE strength and balance. (TARGET DATE: 04/17/2017)    Time 4   Period Weeks   Status New     PT SHORT TERM GOAL #2   Title Patient will score >/= 19/30 on the Functional Gait Assessment in order to demonstrate a decrease in his risk of falling. (TARGET DATE: 04/17/2017)    Time 4   Period Weeks   Status New     PT SHORT TERM GOAL #3   Title Patient's gait velocity will be >/= 2.10ft/s to inidicate a true change in patient's gait velocity and a decrease in his risk of falling. (TARGET DATE: 04/17/2017)    Time 4   Period Weeks   Status New     PT SHORT TERM GOAL #4   Title Patient's 5 x STS will be </= 18s to indicate an increase in functional LE strength and balance. (TARGET DATE: 04/17/2017)    Time 4   Period Weeks   Status New     PT SHORT TERM GOAL #5   Title Patient will be able to complete stairs with 2 rails and a reciprocal gait pattern (ascending and descending stairs) with supervision to indicate an increase in functional LE strength. (TARGET DATE: 04/17/2017)    Time 4   Period Weeks   Status New     Additional Short Term Goals   Additional Short Term Goals Yes     PT SHORT TERM GOAL #6   Title PT will perform 6 Minute Walk Test, and patient will increase from baseline distance by >/= 150 feet to indicate an increase in his endurance. (TARGET DATE: 04/17/2017)    Time 4   Period Weeks    Status New           PT Long Term Goals - 03/19/17 1002      PT LONG TERM GOAL #1   Title Patient will verbalize understanding of ongoing HEP to increase LE strength and balance. (TARGET DATE: 05/15/2017)   Time 8   Period Weeks   Status New     PT LONG TERM GOAL #2   Title  Patient's gait velocity will be >/= 2.57ft/s to indicate safety in community ambulation. (TARGET DATE: 05/15/2017)    Time 8   Period Weeks   Status New     PT LONG TERM GOAL #3   Title Patient's 5 x STS time will be </= 15 seconds with decreased reliance on momentum to perform transfer to indicate a decrease in his risk for recurrent falls. (TARGET DATE: 05/15/2017)    Time 8   Period Weeks   Status New     PT LONG TERM GOAL #4   Title Patient will score >/= 23/30 on the Functional Gait Assessment to indicate a decreaes in his risk of recurrent falls. (TARGET DATE: 05/15/2017)    Time 8   Period Weeks   Status New     PT LONG TERM GOAL #5   Title Patient will demonstrate ability to perform stairs with 1 rail with reciprocal gait pattern (ascending and descending) with modified independence to indicate an increase in functional LE strength and a decrease in his risk of falling. (TARGET DATE: 05/15/2017)   Time 8   Period Weeks   Status New     Additional Long Term Goals   Additional Long Term Goals Yes     PT LONG TERM GOAL #6   Title PT will complete 6 Minute Walk Test, and patient will increase from baseline distance by >/= 385ft to indicate an increase in his endurance. (TARGET DATE: 05/15/2017)    Time 8   Period Weeks   Status New            Plan - 04/10/17 1107    Clinical Impression Statement Today's skilled session continued to focus on correction of gait deviations, LE strengthening and balance activities combined with postural strengthening as well. No issues were reported and pt is making progress toward goals. Pt should benefit from continued PT to progress toward unmet goals.    Rehab  Potential Good   Clinical Impairments Affecting Rehab Potential unstable angina, bradycardia, cardiomyopathy, chest pain, CAD, DM type II, diverticulosis, HTN, hyperlipidemia, mild cognitive impairment, non-ST elevation MI, pheripheral neuropathy    PT Frequency 2x / week   PT Duration 8 weeks   PT Treatment/Interventions ADLs/Self Care Home Management;Neuromuscular re-education;Balance training;Therapeutic activities;Functional mobility training;Stair training;Gait training;DME Instruction;Patient/family education;Manual techniques;Passive range of motion;Energy conservation   PT Next Visit Plan gastroc stretch prior to ambulation, activities to work on heel to toe contact with gait, larger stride length, posture and balance activities   Consulted and Agree with Plan of Care Patient      Patient will benefit from skilled therapeutic intervention in order to improve the following deficits and impairments:  Abnormal gait, Decreased activity tolerance, Decreased balance, Decreased range of motion, Decreased mobility, Decreased knowledge of use of DME, Decreased knowledge of precautions, Decreased endurance, Decreased strength, Difficulty walking, Impaired sensation, Postural dysfunction  Visit Diagnosis: Unsteadiness on feet  Other abnormalities of gait and mobility  Muscle weakness (generalized)     Problem List Patient Active Problem List   Diagnosis Date Noted  . Gait abnormality 01/08/2017  . Neck pain 01/08/2017  . Low back pain 01/08/2017  . Ischemic cardiomyopathy 12/17/2016  . Coronary artery disease involving native coronary artery of native heart with angina pectoris ([place]) 12/17/2016  . Type 2 diabetes mellitus without complications ([place]) 29/93/7169  . Hyperlipidemia 12/17/2016    Willow Ora, PTA, [facility] [employer] [address] [place], [place] [address] [phone] 04/11/17, 5:09 PM  Name: Kiran Carline MRN: [id number] Date of Birth:  1928-02-22

## 2017-04-13 ENCOUNTER — Ambulatory Visit: Payer: Medicare Other | Admitting: Rehabilitation

## 2017-04-13 ENCOUNTER — Encounter: Payer: Self-pay | Admitting: Rehabilitation

## 2017-04-13 DIAGNOSIS — R2689 Other abnormalities of gait and mobility: Secondary | ICD-10-CM

## 2017-04-13 DIAGNOSIS — R2681 Unsteadiness on feet: Secondary | ICD-10-CM | POA: Diagnosis not present

## 2017-04-13 DIAGNOSIS — M6281 Muscle weakness (generalized): Secondary | ICD-10-CM

## 2017-04-13 NOTE — Therapy (Signed)
[facility] [address] [place], [place], [address] Phone: [phone]   Fax:  [phone]  Physical Therapy Treatment  Patient Details  Name: Randall Callahan MRN: [id number] Date of Birth: 1928/05/01 Referring Provider: Marcial Pacas MD   Encounter Date: 04/13/2017      PT End of Session - 04/13/17 0805    Visit Number 5   Number of Visits 17   Date for PT Re-Evaluation 05/15/17   Authorization Type Medicare & G-codes every 10th visit    PT Start Time 0801   PT Stop Time 0846   PT Time Calculation (min) 45 min   Equipment Utilized During Treatment Gait belt   Activity Tolerance Patient tolerated treatment well   Behavior During Therapy [facility] for tasks assessed/performed      Past Medical History:  Diagnosis Date  . Angina pectoris, unstable ([place])   . Angiomyolipoma of kidney   . ASCVD (arteriosclerotic cardiovascular disease)   . Bradycardia   . Cardiomyopathy ([place])   . Chest pain   . Chest tightness   . Coronary artery disease   . Diabetes mellitus without complication ([place])   . Diverticulosis   . Hx of sick sinus syndrome   . Hypertension   . MCI (mild cognitive impairment)   . NSTEMI (non-ST elevation myocardial infarction) ([place])   . Peripheral neuropathy   . Schatzki's ring   . Squamous cell carcinoma in situ of skin     Past Surgical History:  Procedure Laterality Date  . CARDIAC CATHETERIZATION    . CORONARY ANGIOGRAPHY    . JOINT REPLACEMENT    . VASCULAR SURGERY      There were no vitals filed for this visit.      Subjective Assessment - 04/13/17 0803    Subjective "I had more graduation stuff this weekend and so I was pretty tired."    Pertinent History unstable angina, bradycardia, cardiomyopathy, chest pain, CAD, DM type II, diverticulosis, HTN, hyperlipidemia, mild cognitive impairment, non-ST elevation MI, pheripheral neuropathy   Limitations Lifting;Standing;Walking;House hold  activities   Currently in Pain? No/denies                         [facility] Adult PT Treatment/Exercise - 04/13/17 0830      Neuro Re-ed    Neuro Re-ed Details  High level balance in // bars standing on foam balance beam forwards maintaining balance x 2 sets of 30 secs.   Progressed to performing head turns side to side x 10 reps and up/down x 10 reps with cues for counter balance with hips esp when looking up, he tends to keep hips behind him.  Tandem walking forwards and backwards in // bars x 2 reps with intermittent UE support.   Cues for slower speed to increase balance challenge.  Continued in // bars with cone taps alternating LEs then stepping over orange barrier x 7 reps (of obstacles total) x 2 reps progressing to tipping cone over and back upright x 7 reps, 2 sets as before.  Single UE support with cues to be very light for improved challenge.      Exercises   Exercises Other Exercises   Other Exercises  Prior to beginning balance exercises, had pt perform seated knee to chest stretch bilaterally for 1 min each side due to noted hip pain.  Also performed B hip flexor stretch off EOM x 2 min each with slight over pressure (did add  this for home) and standing gastroc stretch on 4" step (single LE at a time) x 1 min each.  Tolerated all well.  seated nustep x 5 mins at level 4-5 resistance (upped halfway) with BUEs/LE at 70's reps per minute to work on endurance.  Tolerated well with HR to 80 today.                 PT Education - 04/13/17 0804    Education provided Yes   Education Details hip flexor stretch, continued education on importance of posture during gait.     Person(s) Educated Patient   Methods Explanation;Demonstration;Handout   Comprehension Verbalized understanding;Returned demonstration          PT Short Term Goals - 03/19/17 1149      PT SHORT TERM GOAL #1   Title Patient will verbalize understanding and return demonstration of initial HEP to  increase LE strength and balance. (TARGET DATE: 04/17/2017)    Time 4   Period Weeks   Status New     PT SHORT TERM GOAL #2   Title Patient will score >/= 19/30 on the Functional Gait Assessment in order to demonstrate a decrease in his risk of falling. (TARGET DATE: 04/17/2017)    Time 4   Period Weeks   Status New     PT SHORT TERM GOAL #3   Title Patient's gait velocity will be >/= 2.26ft/s to inidicate a true change in patient's gait velocity and a decrease in his risk of falling. (TARGET DATE: 04/17/2017)    Time 4   Period Weeks   Status New     PT SHORT TERM GOAL #4   Title Patient's 5 x STS will be </= 18s to indicate an increase in functional LE strength and balance. (TARGET DATE: 04/17/2017)    Time 4   Period Weeks   Status New     PT SHORT TERM GOAL #5   Title Patient will be able to complete stairs with 2 rails and a reciprocal gait pattern (ascending and descending stairs) with supervision to indicate an increase in functional LE strength. (TARGET DATE: 04/17/2017)    Time 4   Period Weeks   Status New     Additional Short Term Goals   Additional Short Term Goals Yes     PT SHORT TERM GOAL #6   Title PT will perform 6 Minute Walk Test, and patient will increase from baseline distance by >/= 150 feet to indicate an increase in his endurance. (TARGET DATE: 04/17/2017)    Time 4   Period Weeks   Status New           PT Long Term Goals - 03/19/17 1002      PT LONG TERM GOAL #1   Title Patient will verbalize understanding of ongoing HEP to increase LE strength and balance. (TARGET DATE: 05/15/2017)   Time 8   Period Weeks   Status New     PT LONG TERM GOAL #2   Title Patient's gait velocity will be >/= 2.3ft/s to indicate safety in community ambulation. (TARGET DATE: 05/15/2017)    Time 8   Period Weeks   Status New     PT LONG TERM GOAL #3   Title Patient's 5 x STS time will be </= 15 seconds with decreased reliance on momentum to perform transfer to  indicate a decrease in his risk for recurrent falls. (TARGET DATE: 05/15/2017)    Time 8   Period Weeks  Status New     PT LONG TERM GOAL #4   Title Patient will score >/= 23/30 on the Functional Gait Assessment to indicate a decreaes in his risk of recurrent falls. (TARGET DATE: 05/15/2017)    Time 8   Period Weeks   Status New     PT LONG TERM GOAL #5   Title Patient will demonstrate ability to perform stairs with 1 rail with reciprocal gait pattern (ascending and descending) with modified independence to indicate an increase in functional LE strength and a decrease in his risk of falling. (TARGET DATE: 05/15/2017)   Time 8   Period Weeks   Status New     Additional Long Term Goals   Additional Long Term Goals Yes     PT LONG TERM GOAL #6   Title PT will complete 6 Minute Walk Test, and patient will increase from baseline distance by >/= 365ft to indicate an increase in his endurance. (TARGET DATE: 05/15/2017)    Time 8   Period Weeks   Status New               Plan - 04/13/17 0805    Clinical Impression Statement Skilled session focused on BLE stretching due to reports of hip pain over the weekend and noted hip flexor and gastroc tightness.  Pt reports spotty compliance with HEP and therefore encouraged him to perform daily to note change.  Also continue to work on high level balance for hip strategy, narrow BOS, compliant surface and SLS.  Tolerated well.     Rehab Potential Good   Clinical Impairments Affecting Rehab Potential unstable angina, bradycardia, cardiomyopathy, chest pain, CAD, DM type II, diverticulosis, HTN, hyperlipidemia, mild cognitive impairment, non-ST elevation MI, pheripheral neuropathy    PT Frequency 2x / week   PT Duration 8 weeks   PT Treatment/Interventions ADLs/Self Care Home Management;Neuromuscular re-education;Balance training;Therapeutic activities;Functional mobility training;Stair training;Gait training;DME Instruction;Patient/family  education;Manual techniques;Passive range of motion;Energy conservation   PT Next Visit Plan STGs, gastroc stretch prior to ambulation, activities to work on heel to toe contact with gait, larger stride length, posture and balance activities   Consulted and Agree with Plan of Care Patient      Patient will benefit from skilled therapeutic intervention in order to improve the following deficits and impairments:  Abnormal gait, Decreased activity tolerance, Decreased balance, Decreased range of motion, Decreased mobility, Decreased knowledge of use of DME, Decreased knowledge of precautions, Decreased endurance, Decreased strength, Difficulty walking, Impaired sensation, Postural dysfunction  Visit Diagnosis: Unsteadiness on feet  Other abnormalities of gait and mobility  Muscle weakness (generalized)     Problem List Patient Active Problem List   Diagnosis Date Noted  . Gait abnormality 01/08/2017  . Neck pain 01/08/2017  . Low back pain 01/08/2017  . Ischemic cardiomyopathy 12/17/2016  . Coronary artery disease involving native coronary artery of native heart with angina pectoris ([place]) 12/17/2016  . Type 2 diabetes mellitus without complications ([place]) 90/30/0923  . Hyperlipidemia 12/17/2016    Cameron Sprang, PT, MPT [facility] [address] [place], [place], [address] Phone: [phone]   Fax:  [phone] 04/13/17, 9:02 AM   Name: Roshawn Ayala MRN: [id number] Date of Birth: 1928/10/11

## 2017-04-13 NOTE — Patient Instructions (Signed)
Hip Flexor Stretch    Lying on back near edge of bed, bend one leg, foot flat. Hang other leg over edge, relaxed, thigh resting entirely on bed for __2__ minutes. Repeat __2__ times. Do __1-2__ sessions per day. Advanced Exercise: Bend knee back keeping thigh in contact with bed.  http://gt2.exer.us/346   Copyright  VHI. All rights reserved.

## 2017-04-16 ENCOUNTER — Ambulatory Visit: Payer: Medicare Other | Admitting: Physical Therapy

## 2017-04-20 ENCOUNTER — Ambulatory Visit: Payer: Medicare Other | Admitting: Physical Therapy

## 2017-04-22 ENCOUNTER — Ambulatory Visit: Payer: Medicare Other | Admitting: Physical Therapy

## 2017-04-24 ENCOUNTER — Encounter: Payer: Self-pay | Admitting: Rehabilitation

## 2017-04-24 NOTE — Therapy (Signed)
[facility] [address] [place], [place], [address] Phone: [phone]   Fax:  [phone]  Patient Details  Name: Randall Callahan MRN: [id number] Date of Birth: 02-01-1928 Referring Provider:  No ref. provider found  Encounter Date: 26-Apr-2017   PHYSICAL THERAPY DISCHARGE SUMMARY  Visits from Start of Care: 5  Current functional level related to goals / functional outcomes:     PT Long Term Goals - 03/19/17 1002      PT LONG TERM GOAL #1   Title Patient will verbalize understanding of ongoing HEP to increase LE strength and balance. (TARGET DATE: 05/15/2017)   Time 8   Period Weeks   Status New     PT LONG TERM GOAL #2   Title Patient's gait velocity will be >/= 2.66f/s to indicate safety in community ambulation. (TARGET DATE: 05/15/2017)    Time 8   Period Weeks   Status New     PT LONG TERM GOAL #3   Title Patient's 5 x STS time will be </= 15 seconds with decreased reliance on momentum to perform transfer to indicate a decrease in his risk for recurrent falls. (TARGET DATE: 05/15/2017)    Time 8   Period Weeks   Status New     PT LONG TERM GOAL #4   Title Patient will score >/= 23/30 on the Functional Gait Assessment to indicate a decreaes in his risk of recurrent falls. (TARGET DATE: 05/15/2017)    Time 8   Period Weeks   Status New     PT LONG TERM GOAL #5   Title Patient will demonstrate ability to perform stairs with 1 rail with reciprocal gait pattern (ascending and descending) with modified independence to indicate an increase in functional LE strength and a decrease in his risk of falling. (TARGET DATE: 05/15/2017)   Time 8   Period Weeks   Status New     Additional Long Term Goals   Additional Long Term Goals Yes     PT LONG TERM GOAL #6   Title PT will complete 6 Minute Walk Test, and patient will increase from baseline distance by >/= 3053fto indicate an increase in his endurance. (TARGET  DATE: 05/15/2017)    Time 8   Period Weeks   Status New        Remaining deficits: Unsure as pt did not return, reports he is moving in with daughter in [place]   Education / Equipment: HEP  Plan: Patient agrees to discharge.  Patient goals were not met. Patient is being discharged due to not returning since the last visit.  ?????             '     G-Codes - 0607/01/2018114    Functional Assessment Tool Used (Outpatient Only) Functional Gait Assessment = 16/30; Gait velocity = 1.7180f; 5 Time Sit to Stand Test = 22.08s    Functional Limitation Mobility: Walking and moving around   Mobility: Walking and Moving Around Current Status ([id number] least 40 percent but less than 60 percent impaired, limited or restricted   Mobility: Walking and Moving Around Goal Status ([phone] least 20 percent but less than 40 percent impaired, limited or restricted   Mobility: Walking and Moving Around Discharge Status ([phone] least 40 percent but less than 60 percent impaired, limited or restricted        EmiCameron SprangT, MPT [address]  [place], [place], [address] Phone: [phone]   Fax:  [phone] 04/24/17, 11:15 AM

## 2017-04-27 ENCOUNTER — Ambulatory Visit: Payer: Medicare Other | Admitting: Physical Therapy

## 2017-04-30 ENCOUNTER — Ambulatory Visit: Payer: Medicare Other | Admitting: Rehabilitation

## 2017-05-04 ENCOUNTER — Ambulatory Visit: Payer: Medicare Other | Admitting: Physical Therapy

## 2017-05-07 ENCOUNTER — Ambulatory Visit: Payer: Medicare Other | Admitting: Rehabilitation

## 2017-05-11 ENCOUNTER — Ambulatory Visit: Payer: Medicare Other | Admitting: Physical Therapy

## 2017-05-14 ENCOUNTER — Ambulatory Visit: Payer: Medicare Other | Admitting: Physical Therapy

## 2017-05-18 ENCOUNTER — Ambulatory Visit: Payer: Medicare Other | Admitting: Physical Therapy

## 2017-05-21 ENCOUNTER — Ambulatory Visit: Payer: Medicare Other | Admitting: Physical Therapy

## 2017-08-10 NOTE — Telephone Encounter (Signed)
Closing document °

## 2017-09-08 ENCOUNTER — Ambulatory Visit: Payer: Medicare Other | Admitting: Neurology

## 2017-09-29 ENCOUNTER — Emergency Department (HOSPITAL_COMMUNITY): Payer: Medicare Other

## 2017-09-29 ENCOUNTER — Encounter (HOSPITAL_COMMUNITY): Payer: Self-pay | Admitting: Internal Medicine

## 2017-09-29 ENCOUNTER — Other Ambulatory Visit: Payer: Self-pay

## 2017-09-29 ENCOUNTER — Inpatient Hospital Stay (HOSPITAL_COMMUNITY)
Admission: EM | Admit: 2017-09-29 | Discharge: 2017-10-03 | DRG: 246 | Disposition: A | Discharge status: Home / Self Care | Payer: Medicare Other | Attending: Internal Medicine | Admitting: Internal Medicine

## 2017-09-29 DIAGNOSIS — I130 Hypertensive heart and chronic kidney disease with heart failure and stage 1 through stage 4 chronic kidney disease, or unspecified chronic kidney disease: Secondary | ICD-10-CM | POA: Diagnosis present

## 2017-09-29 DIAGNOSIS — E119 Type 2 diabetes mellitus without complications: Secondary | ICD-10-CM | POA: Diagnosis not present

## 2017-09-29 DIAGNOSIS — I5021 Acute systolic (congestive) heart failure: Secondary | ICD-10-CM | POA: Diagnosis present

## 2017-09-29 DIAGNOSIS — Z7984 Long term (current) use of oral hypoglycemic drugs: Secondary | ICD-10-CM

## 2017-09-29 DIAGNOSIS — K219 Gastro-esophageal reflux disease without esophagitis: Secondary | ICD-10-CM | POA: Diagnosis present

## 2017-09-29 DIAGNOSIS — Z8042 Family history of malignant neoplasm of prostate: Secondary | ICD-10-CM | POA: Diagnosis not present

## 2017-09-29 DIAGNOSIS — I252 Old myocardial infarction: Secondary | ICD-10-CM

## 2017-09-29 DIAGNOSIS — E1122 Type 2 diabetes mellitus with diabetic chronic kidney disease: Secondary | ICD-10-CM | POA: Diagnosis present

## 2017-09-29 DIAGNOSIS — Z955 Presence of coronary angioplasty implant and graft: Secondary | ICD-10-CM | POA: Diagnosis not present

## 2017-09-29 DIAGNOSIS — I509 Heart failure, unspecified: Secondary | ICD-10-CM | POA: Diagnosis not present

## 2017-09-29 DIAGNOSIS — I2510 Atherosclerotic heart disease of native coronary artery without angina pectoris: Secondary | ICD-10-CM | POA: Diagnosis not present

## 2017-09-29 DIAGNOSIS — E785 Hyperlipidemia, unspecified: Secondary | ICD-10-CM | POA: Diagnosis present

## 2017-09-29 DIAGNOSIS — Z950 Presence of cardiac pacemaker: Secondary | ICD-10-CM | POA: Diagnosis not present

## 2017-09-29 DIAGNOSIS — Z7902 Long term (current) use of antithrombotics/antiplatelets: Secondary | ICD-10-CM

## 2017-09-29 DIAGNOSIS — I25110 Atherosclerotic heart disease of native coronary artery with unstable angina pectoris: Secondary | ICD-10-CM | POA: Diagnosis present

## 2017-09-29 DIAGNOSIS — I340 Nonrheumatic mitral (valve) insufficiency: Secondary | ICD-10-CM | POA: Diagnosis not present

## 2017-09-29 DIAGNOSIS — Z823 Family history of stroke: Secondary | ICD-10-CM

## 2017-09-29 DIAGNOSIS — I5022 Chronic systolic (congestive) heart failure: Secondary | ICD-10-CM | POA: Diagnosis not present

## 2017-09-29 DIAGNOSIS — Z7982 Long term (current) use of aspirin: Secondary | ICD-10-CM

## 2017-09-29 DIAGNOSIS — I255 Ischemic cardiomyopathy: Secondary | ICD-10-CM | POA: Diagnosis present

## 2017-09-29 DIAGNOSIS — Z96652 Presence of left artificial knee joint: Secondary | ICD-10-CM | POA: Diagnosis present

## 2017-09-29 DIAGNOSIS — E871 Hypo-osmolality and hyponatremia: Secondary | ICD-10-CM | POA: Diagnosis present

## 2017-09-29 DIAGNOSIS — I214 Non-ST elevation (NSTEMI) myocardial infarction: Secondary | ICD-10-CM | POA: Diagnosis present

## 2017-09-29 DIAGNOSIS — I25118 Atherosclerotic heart disease of native coronary artery with other forms of angina pectoris: Secondary | ICD-10-CM | POA: Diagnosis not present

## 2017-09-29 DIAGNOSIS — I495 Sick sinus syndrome: Secondary | ICD-10-CM | POA: Diagnosis present

## 2017-09-29 DIAGNOSIS — E875 Hyperkalemia: Secondary | ICD-10-CM | POA: Diagnosis present

## 2017-09-29 DIAGNOSIS — N183 Chronic kidney disease, stage 3 (moderate): Secondary | ICD-10-CM | POA: Diagnosis present

## 2017-09-29 DIAGNOSIS — R0600 Dyspnea, unspecified: Secondary | ICD-10-CM

## 2017-09-29 DIAGNOSIS — I25119 Atherosclerotic heart disease of native coronary artery with unspecified angina pectoris: Secondary | ICD-10-CM

## 2017-09-29 HISTORY — DX: Unspecified malignant neoplasm of skin of unspecified part of face: C44.300

## 2017-09-29 HISTORY — DX: Acute myocardial infarction, unspecified: I21.9

## 2017-09-29 HISTORY — DX: Low back pain, unspecified: M54.50

## 2017-09-29 HISTORY — DX: Chronic kidney disease, stage 3 unspecified: N18.30

## 2017-09-29 HISTORY — DX: Pure hypercholesterolemia, unspecified: E78.00

## 2017-09-29 HISTORY — DX: Gastro-esophageal reflux disease without esophagitis: K21.9

## 2017-09-29 HISTORY — DX: Unspecified osteoarthritis, unspecified site: M19.90

## 2017-09-29 HISTORY — DX: Adverse effect of unspecified anesthetic, initial encounter: T41.45XA

## 2017-09-29 HISTORY — DX: Low back pain: M54.5

## 2017-09-29 HISTORY — DX: Chronic kidney disease, stage 3 (moderate): N18.3

## 2017-09-29 HISTORY — DX: Other complications of anesthesia, initial encounter: T88.59XA

## 2017-09-29 HISTORY — DX: Other chronic pain: G89.29

## 2017-09-29 HISTORY — DX: Pneumonia, unspecified organism: J18.9

## 2017-09-29 HISTORY — DX: Presence of cardiac pacemaker: Z95.0

## 2017-09-29 HISTORY — DX: Malignant (primary) neoplasm, unspecified: C80.1

## 2017-09-29 HISTORY — DX: Type 2 diabetes mellitus without complications: E11.9

## 2017-09-29 LAB — CBC
HEMATOCRIT: 43.6 % (ref 39.0–52.0)
HEMOGLOBIN: 14.7 g/dL (ref 13.0–17.0)
MCH: 29.4 pg (ref 26.0–34.0)
MCHC: 33.7 g/dL (ref 30.0–36.0)
MCV: 87.2 fL (ref 78.0–100.0)
Platelets: 234 10*3/uL (ref 150–400)
RBC: 5 MIL/uL (ref 4.22–5.81)
RDW: 14.8 % (ref 11.5–15.5)
WBC: 11.1 10*3/uL — ABNORMAL HIGH (ref 4.0–10.5)

## 2017-09-29 LAB — TSH: TSH: 1.137 u[IU]/mL (ref 0.350–4.500)

## 2017-09-29 LAB — BASIC METABOLIC PANEL
ANION GAP: 14 (ref 5–15)
BUN: 17 mg/dL (ref 6–20)
CHLORIDE: 101 mmol/L (ref 101–111)
CO2: 18 mmol/L — AB (ref 22–32)
Calcium: 9.3 mg/dL (ref 8.9–10.3)
Creatinine, Ser: 1.29 mg/dL — ABNORMAL HIGH (ref 0.61–1.24)
GFR calc non Af Amer: 48 mL/min — ABNORMAL LOW (ref >60)
GFR, EST AFRICAN AMERICAN: 56 mL/min — AB (ref >60)
GLUCOSE: 202 mg/dL — AB (ref 65–99)
Potassium: 5.2 mmol/L — ABNORMAL HIGH (ref 3.5–5.1)
Sodium: 133 mmol/L — ABNORMAL LOW (ref 135–145)

## 2017-09-29 LAB — CBG MONITORING, ED
Glucose-Capillary: 135 mg/dL — ABNORMAL HIGH (ref 65–99)
Glucose-Capillary: 152 mg/dL — ABNORMAL HIGH (ref 65–99)

## 2017-09-29 LAB — GLUCOSE, CAPILLARY: Glucose-Capillary: 191 mg/dL — ABNORMAL HIGH (ref 65–99)

## 2017-09-29 LAB — HEPARIN LEVEL (UNFRACTIONATED): Heparin Unfractionated: 0.47 IU/mL (ref 0.30–0.70)

## 2017-09-29 LAB — I-STAT TROPONIN, ED: TROPONIN I, POC: 0.04 ng/mL (ref 0.00–0.08)

## 2017-09-29 LAB — BRAIN NATRIURETIC PEPTIDE: B NATRIURETIC PEPTIDE 5: 205.7 pg/mL — AB (ref 0.0–100.0)

## 2017-09-29 LAB — TROPONIN I
TROPONIN I: 0.67 ng/mL — AB (ref <0.03)
TROPONIN I: 3.83 ng/mL — AB (ref <0.03)
TROPONIN I: 4.95 ng/mL — AB (ref <0.03)

## 2017-09-29 MED ORDER — NITROGLYCERIN 0.4 MG SL SUBL: 0.4000 mg | SUBLINGUAL_TABLET | SUBLINGUAL | Status: DC | PRN

## 2017-09-29 MED ORDER — SODIUM CHLORIDE 0.9% FLUSH: 3.0000 mL | INTRAVENOUS | Status: DC | PRN

## 2017-09-29 MED ORDER — CLOPIDOGREL BISULFATE 75 MG PO TABS
75.0000 mg | ORAL_TABLET | Freq: Every day | ORAL | Status: DC
  Administered 2017-09-29 – 2017-09-30 (×2): 75 mg via ORAL
  Filled 2017-09-29 (×2): qty 1

## 2017-09-29 MED ORDER — SODIUM CHLORIDE 0.9% FLUSH
3.0000 mL | Freq: Two times a day (BID) | INTRAVENOUS | Status: DC
  Administered 2017-09-29 – 2017-10-01 (×3): 3 mL via INTRAVENOUS

## 2017-09-29 MED ORDER — HEPARIN BOLUS VIA INFUSION
4000.0000 [IU] | Freq: Once | INTRAVENOUS | Status: AC
  Administered 2017-09-29: 4000 [IU] via INTRAVENOUS
  Filled 2017-09-29: qty 4000

## 2017-09-29 MED ORDER — ISOSORBIDE MONONITRATE ER 60 MG PO TB24
120.0000 mg | ORAL_TABLET | Freq: Every day | ORAL | Status: DC
  Administered 2017-09-29 – 2017-10-03 (×5): 120 mg via ORAL
  Filled 2017-09-29 (×6): qty 2

## 2017-09-29 MED ORDER — SODIUM CHLORIDE 0.9 % IV SOLN: 250.0000 mL | INTRAVENOUS | Status: DC | PRN

## 2017-09-29 MED ORDER — SODIUM CHLORIDE 0.9 % WEIGHT BASED INFUSION
1.0000 mL/kg/h | INTRAVENOUS | Status: DC
  Administered 2017-09-30: 1 mL/kg/h via INTRAVENOUS

## 2017-09-29 MED ORDER — ATORVASTATIN CALCIUM 40 MG PO TABS
40.0000 mg | ORAL_TABLET | Freq: Every day | ORAL | Status: DC
  Administered 2017-09-29 – 2017-10-02 (×4): 40 mg via ORAL
  Filled 2017-09-29 (×5): qty 1

## 2017-09-29 MED ORDER — FUROSEMIDE 10 MG/ML IJ SOLN
40.0000 mg | Freq: Once | INTRAMUSCULAR | Status: AC
  Administered 2017-09-29: 40 mg via INTRAVENOUS
  Filled 2017-09-29: qty 4

## 2017-09-29 MED ORDER — LISINOPRIL 5 MG PO TABS
2.5000 mg | ORAL_TABLET | Freq: Every day | ORAL | Status: DC
  Administered 2017-09-29 – 2017-10-03 (×5): 2.5 mg via ORAL
  Filled 2017-09-29 (×5): qty 1

## 2017-09-29 MED ORDER — FUROSEMIDE 10 MG/ML IJ SOLN: 40.0000 mg | Freq: Every day | INTRAMUSCULAR | Status: DC

## 2017-09-29 MED ORDER — INSULIN ASPART 100 UNIT/ML ~~LOC~~ SOLN: 0.0000 [IU] | Freq: Every day | SUBCUTANEOUS | Status: DC

## 2017-09-29 MED ORDER — HEPARIN (PORCINE) IN NACL 100-0.45 UNIT/ML-% IJ SOLN
1200.0000 [IU]/h | INTRAMUSCULAR | Status: DC
  Administered 2017-09-29 (×2): 1200 [IU]/h via INTRAVENOUS
  Filled 2017-09-29 (×3): qty 250

## 2017-09-29 MED ORDER — INSULIN ASPART 100 UNIT/ML ~~LOC~~ SOLN
0.0000 [IU] | Freq: Three times a day (TID) | SUBCUTANEOUS | Status: DC
Start: 2017-09-29 — End: 2017-10-03
  Administered 2017-09-29: 1 [IU] via SUBCUTANEOUS
  Administered 2017-09-29: 2 [IU] via SUBCUTANEOUS
  Administered 2017-09-30 – 2017-10-01 (×4): 1 [IU] via SUBCUTANEOUS
  Administered 2017-10-01: 2 [IU] via SUBCUTANEOUS
  Administered 2017-10-01: 1 [IU] via SUBCUTANEOUS
  Administered 2017-10-02 (×2): 2 [IU] via SUBCUTANEOUS
  Administered 2017-10-03: 1 [IU] via SUBCUTANEOUS
  Filled 2017-09-29 (×2): qty 1

## 2017-09-29 MED ORDER — SODIUM CHLORIDE 0.9 % WEIGHT BASED INFUSION: 3.0000 mL/kg/h | INTRAVENOUS | Status: DC

## 2017-09-29 MED ORDER — ASPIRIN 81 MG PO CHEW
81.0000 mg | CHEWABLE_TABLET | ORAL | Status: AC
  Administered 2017-09-30: 81 mg via ORAL
  Filled 2017-09-29: qty 1

## 2017-09-29 MED ORDER — ENOXAPARIN SODIUM 40 MG/0.4ML ~~LOC~~ SOLN: 40.0000 mg | SUBCUTANEOUS | Status: DC

## 2017-09-29 MED ORDER — ACETAMINOPHEN 325 MG PO TABS: 650.0000 mg | ORAL_TABLET | Freq: Four times a day (QID) | ORAL | Status: DC | PRN

## 2017-09-29 MED ORDER — SODIUM CHLORIDE 0.9 % IV SOLN
INTRAVENOUS | Status: AC
  Administered 2017-09-29: 21:00:00 via INTRAVENOUS

## 2017-09-29 MED ORDER — METOPROLOL SUCCINATE ER 50 MG PO TB24
75.0000 mg | ORAL_TABLET | Freq: Every day | ORAL | Status: DC
  Administered 2017-09-29 – 2017-10-03 (×5): 75 mg via ORAL
  Filled 2017-09-29 (×5): qty 1

## 2017-09-29 MED ORDER — SODIUM CHLORIDE 0.9% FLUSH
3.0000 mL | Freq: Two times a day (BID) | INTRAVENOUS | Status: DC
  Administered 2017-09-29: 3 mL via INTRAVENOUS

## 2017-09-29 MED ORDER — ACETAMINOPHEN 650 MG RE SUPP: 650.0000 mg | Freq: Four times a day (QID) | RECTAL | Status: DC | PRN

## 2017-09-29 MED ORDER — ASPIRIN EC 81 MG PO TBEC
81.0000 mg | DELAYED_RELEASE_TABLET | Freq: Every day | ORAL | Status: DC
  Administered 2017-09-29 – 2017-10-01 (×3): 81 mg via ORAL
  Filled 2017-09-29 (×3): qty 1

## 2017-09-29 NOTE — Progress Notes (Signed)
Received pt from 5 C at around 1830 for cardiac cath in AM, is alert and verbal, connected to tele, son is in room with patient.

## 2017-09-29 NOTE — Care Management Note (Signed)
Case Management Note  Patient Details  Name: Randall Callahan MRN: [id number] Date of Birth: 1928-09-22  Subjective/Objective:                  61 yom presented to the ED with CP.  From home with son and daughter-in-law.    Action/Plan: Admit status INPATIENT (CP, CAD); anticipate discharge [place].   Expected Discharge Date:  (unknown)               Expected Discharge Plan:  [place]  In-House Referral:  NA  Discharge planning Services  CM Consult  Post Acute Care Choice:    Choice offered to:     DME Arranged:    DME Agency:     HH Arranged:    HH Agency:     Status of Service:  In process, will continue to follow  If discussed at Long Length of Stay Meetings, dates discussed:    Additional Comments:  Fuller Mandril, RN 09/29/2017, 1:43 PM

## 2017-09-29 NOTE — Consult Note (Signed)
Cardiology Consultation:   Patient ID: Randall Callahan; [id number]; 02-02-1928   Admit date: 09/29/2017 Date of Consult: 09/29/2017  Primary Care Provider: Dineen Kid, MD Primary Cardiologist: Dr. Curt Bears Primary Electrophysiologist:     Patient Profile:   Randall Callahan is a 83 y.o. male with a hx of sick sinus syndrome s/p dual chamber PPM (2015), ischemic cardiomyopathy, CAD s/p LAD and OM1 stenting (2003), LHC with occluded stent in 2008, NSTEMI 08/2016 resulting in medical management with beta blockers and long-acting nitrates, STEMI (troponin 0.24) without repeat heart catheterization who is being seen today for the evaluation of chest pain at the request of Dr. Maryland Pink.  History of Present Illness:   Randall Callahan was previously treated in [place], with a recent move to [place]. He has seen Dr. Curt Bears in clinic on 03/16/17. At that time, he c/o dsypnea and chest pain, although he denied chest pain during cardiac rehab. Imdur was increased at that time.  He has since returned to [place] and came back to [place] 2 weeks ago.  He presented to [facility] with chest pain with a troponin of 0.67. On my interview, he states that he rides the stationary bike 30 min daily without exertional chest pain, but notes his heart rate sometimes drops during exercise. He states that he sometimes has rapid heart rate for which he treats at home with SL nitro. Last evening around midnight, he had increased heart rate in the 100s. He took 2 SL nitros without relief and reported to [facility]. During times of increased heart rate, he experiences chest pain in his left chest that radiates to his left jaw. No other associated symptoms. This is generally relieved within 30 min with nitro and rest. He is currently resting flat and has not chest pain. Monitor with paced rhythm in the 60s. He has never been instructed to take an extra toprol for times of rapid heart rate.   Past Medical History:  Diagnosis Date  . Angina  pectoris, unstable ([place])   . Angiomyolipoma of kidney   . ASCVD (arteriosclerotic cardiovascular disease)   . Bradycardia   . Cardiomyopathy ([place])   . Chest pain   . Chest tightness   . Coronary artery disease   . Diabetes mellitus without complication ([place])   . Diverticulosis   . Hx of sick sinus syndrome   . Hypertension   . MCI (mild cognitive impairment)   . NSTEMI (non-ST elevation myocardial infarction) ([place])   . Peripheral neuropathy   . Schatzki's ring   . Squamous cell carcinoma in situ of skin     Past Surgical History:  Procedure Laterality Date  . CARDIAC CATHETERIZATION    . CORONARY ANGIOGRAPHY    . JOINT REPLACEMENT    . VASCULAR SURGERY       Home Medications:  Prior to Admission medications   Medication Sig Start Date End Date Taking? Authorizing Provider  aspirin EC 81 MG tablet Take 81 mg by mouth daily with breakfast.    Yes [provider]  atorvastatin (LIPITOR) 40 MG tablet Take 40 mg by mouth at bedtime.    Yes [provider]  clopidogrel (PLAVIX) 75 MG tablet Take 75 mg by mouth daily with breakfast.    Yes [provider]  isosorbide mononitrate (IMDUR) 60 MG 24 hr tablet Take 1 tablet (60 mg total) by mouth daily. Patient taking differently: Take 120 mg by mouth daily.  03/16/17  Yes Camnitz, Will Hassell Done, MD  lisinopril (PRINIVIL,ZESTRIL) 2.5 MG tablet Take  2.5 mg by mouth daily with breakfast.    Yes [provider]  metFORMIN (GLUCOPHAGE) 1000 MG tablet Take 1,000 mg by mouth 2 (two) times daily with a meal.   Yes [provider]  metoprolol succinate (TOPROL-XL) 50 MG 24 hr tablet Take 75 mg by mouth daily after breakfast. Take with or immediately following a meal.   Yes [provider]  nitroGLYCERIN (NITROSTAT) 0.4 MG SL tablet Place 0.4 mg under the tongue every 5 (five) minutes as needed for chest pain. Up to 3 doses for chest pain. If pain has not subsided after 3 doses go to the ER.   Yes  [provider]  Omega-3 Fatty Acids (FISH OIL PO) Take 1,600 mg by mouth daily.   Yes [provider]    Inpatient Medications: Scheduled Meds: . aspirin EC  81 mg Oral Q breakfast  . atorvastatin  40 mg Oral QHS  . clopidogrel  75 mg Oral Q breakfast  . enoxaparin (LOVENOX) injection  40 mg Subcutaneous Q24H  . furosemide  40 mg Intravenous Daily  . insulin aspart  0-5 Units Subcutaneous QHS  . insulin aspart  0-9 Units Subcutaneous TID WC  . isosorbide mononitrate  120 mg Oral Daily  . lisinopril  2.5 mg Oral Q breakfast  . metoprolol succinate  75 mg Oral QPC breakfast  . sodium chloride flush  3 mL Intravenous Q12H   Continuous Infusions: . sodium chloride     PRN Meds: sodium chloride, acetaminophen **OR** acetaminophen, nitroGLYCERIN, sodium chloride flush  Allergies:   No Known Allergies  Social History:   Social History   Socioeconomic History  . Marital status: Divorced    Spouse name: Not on file  . Number of children: 6  . Years of education: some college  . Highest education level: Not on file  Social Needs  . Financial resource strain: Not on file  . Food insecurity - worry: Not on file  . Food insecurity - inability: Not on file  . Transportation needs - medical: Not on file  . Transportation needs - non-medical: Not on file  Occupational History  . Occupation: Retired  Tobacco Use  . Smoking status: Former Smoker    Years: 2.00    Types: Cigarettes  . Smokeless tobacco: Never Used  . Tobacco comment: STARTED SMOKING IN 4TH GRADE, QUIT IN 6TH  Substance and Sexual Activity  . Alcohol use: Yes    Alcohol/week: 1.2 - 1.8 oz    Types: 1 - 2 Glasses of wine, 1 Cans of beer per week    Comment: 1-2 drinks per week  . Drug use: No  . Sexual activity: No  Other Topics Concern  . Not on file  Social History Narrative   Lives at home with son and daughter-in-law.   Right-handed.   2 cups caffeine per day.    Family History:     Family History  Problem Relation Age of Onset  . Angina Mother   . Stroke Mother   . Other Father        Car accident  . Prostate cancer Brother   . Cancer Brother      ROS:  Please see the history of present illness.  ROS  All other ROS reviewed and negative.     Physical Exam/Data:   Vitals:   09/29/17 0230 09/29/17 0330 09/29/17 0500 09/29/17 0600  BP: 113/63 116/64 106/66 111/63  Pulse: 85 79 79 76  Resp: (!) 26 (!)  23 (!) 22 20  Temp:      TempSrc:      SpO2: 93% 96% 96% 92%  Weight:      Height:        Intake/Output Summary (Last 24 hours) at 09/29/2017 0736 Last data filed at 09/29/2017 5397 Gross per 24 hour  Intake -  Output 600 ml  Net -600 ml   Filed Weights   09/29/17 0105  Weight: 208 lb (94.3 kg)   Body mass index is 28.21 kg/m.  General:  Well nourished, well developed, in no acute distress HEENT: normal Neck: no JVD Vascular: No carotid bruits Cardiac:  normal S1, S2; RRR; no murmur Lungs:  clear to auscultation bilaterally, no wheezing, rhonchi or rales  Abd: soft, nontender, no hepatomegaly  Ext: no edema Musculoskeletal:  No deformities, BUE and BLE strength normal and equal Skin: warm and dry  Neuro:  CNs 2-12 intact, no focal abnormalities noted Psych:  Normal affect   EKG:  The EKG was personally reviewed and demonstrates:  Paced rhythm Telemetry:  Telemetry was personally reviewed and demonstrates:  Vpaced  Relevant CV Studies:  Echo pending  Cardiac cath 08/27/16  -Left main mild diffuse disease -LAD moderate diffuse disease, the mid segment of the LAD has a single discrete total occlusion. Distal flow was via collaterals from the RCA. The distal vessel was large. The lesion represented in-stent restenosis following a prior coronary stent intervention. -Left circumflex: Mild diffuse disease in the entire vessel. There was a 90+ percent diffuse stenosis of the proximal segment of the first OM. The arm was moderate size. Distal  flow was decreased and was via the native vessel and collaterals from the RCA.  -Right coronary artery: Mild diffuse disease. Moderate diffuse disease of the entire vessel segment of the right PDA.  -[place]: Moderate diffuse disease of the proximal segment.    Laboratory Data:  Chemistry Recent Labs  Lab 09/29/17 0109  NA 133*  K 5.2*  CL 101  CO2 18*  GLUCOSE 202*  BUN 17  CREATININE 1.29*  CALCIUM 9.3  GFRNONAA 48*  GFRAA 56*  ANIONGAP 14    No results for input(s): PROT, ALBUMIN, AST, ALT, ALKPHOS, BILITOT in the last 168 hours. Hematology Recent Labs  Lab 09/29/17 0109  WBC 11.1*  RBC 5.00  HGB 14.7  HCT 43.6  MCV 87.2  MCH 29.4  MCHC 33.7  RDW 14.8  PLT 234   Cardiac Enzymes Recent Labs  Lab 09/29/17 0514  TROPONINI 0.67*    Recent Labs  Lab 09/29/17 0127  TROPIPOC 0.04    BNP Recent Labs  Lab 09/29/17 0109  BNP 205.7*    DDimer No results for input(s): DDIMER in the last 168 hours.  Radiology/Studies:  Dg Chest 2 View  Result Date: 09/29/2017 CLINICAL DATA:  Initial evaluation for acute chest pain. Job discomfort, bilateral upper arm pain. EXAM: CHEST  2 VIEW COMPARISON:  Prior radiograph from 02/17/2017. FINDINGS: The cardiac and mediastinal silhouettes are stable in size and contour, and remain within normal limits. Left-sided pacemaker/AICD in place. Aortic atherosclerosis. Lungs are mildly hyperinflated. Underlying chronic lung changes noted, grossly similar to previous. Superimposed diffuse vascular and interstitial prominence, consistent with pulmonary interstitial edema. Evaluation for pleural effusion limited as the costophrenic angles are incompletely visualized. No consolidative airspace disease. No pneumothorax. No acute osseus abnormality.  Osteopenia. IMPRESSION: 1. Chronic lung changes with superimposed diffuse interstitial prominence, compatible with mild pulmonary interstitial edema. 2. Aortic atherosclerosis. Electronically Signed  By: Jeannine Boga M.D.   On: 09/29/2017 02:43    Assessment and Plan:   1. Chest pain, CAD s/p heart cath with multi-vessel disease - on ASA and plavix - on beta blocker, imdur, and lisinopril - troponin 0.67 - EKG with paced rhythm, difficult to tell if there are ischemic changes - elevated troponin may be related to demand ischemia in the setting of tachycardia, although he has known disease that has been treated medically - he states he because dyspneic with transferrin to a new bed overnight and with a BM at 0200  2. Ischemic cardiomyopathy - BNP mildly elevated 205.7 - does not appear to be in an acute exacerbation  3. Sick sinus syndrome - [employer] Jude PPM - functioning properly 02/2017 - will interrogate PPM to view tachycardic event last evening  4. HLD - continue statin    For questions or updates, please contact [place] Please consult [email or web address] for contact info under Cardiology/STEMI.   Signed, Tami Lin Duke, PA  09/29/2017 7:36 AM  As above, patient seen and examined.  Briefly he is an 83 year old male with past medical history of coronary artery disease, ischemic cardiomyopathy with LV function unknown, prior permanent pacemaker, diabetes mellitus, hypertension for evaluation of non-ST elevation myocardial infarction.  Patient states that for the past 1 year he has noticed elevated heart rate with activities followed by chest heaviness and jaw pain.  Symptoms resolve with rest.  Last evening he developed the symptoms while in bed.  The pain was not associated with nausea, dyspnea or diaphoresis.  It lasted 2 hours and resolved.  He is presently pain-free.  Laboratories include troponin of 0.67.  Creatinine 1.29.  Electrocardiogram shows sinus tachycardia with PVCs and ventricular pacing.  1 non-ST elevation myocardial infarction-patient presents with symptoms of unstable angina progressing to rest pain last evening.  Troponin is elevated consistent with  non-ST elevation myocardial infarction.  Continue home medications including aspirin, Plavix, statin and metoprolol.  Plan cardiac catheterization to see if there is a lesion amenable to PCI.  The risks and benefits were discussed including myocardial infarction, CVA and death and he agrees to proceed.  He also has baseline renal insufficiency.  We will gently hydrate prior to procedure.  Hold Glucophage for 48 hours following procedure.  Hold Lasix prior to procedure.  2 ischemic cardiomyopathy-continue ACE inhibitor and beta-blocker.  3 status post pacemaker-patient describes periods of elevated heart rate with activities.  Will have device interrogated.  4 chronic stage III kidney disease-follow renal function closely after catheterization.  Kirk Ruths, MD

## 2017-09-29 NOTE — ED Notes (Signed)
Interrogated [facility] Pacemaker Paged Angie PA update Awaiting translation of results

## 2017-09-29 NOTE — ED Notes (Signed)
Please page Angie PA  [phone] when pacemaker interrogation is complete.

## 2017-09-29 NOTE — H&P (Signed)
TRH H&P   Patient Demographics:    Randall Callahan, is a 83 y.o. male  MRN: [id number]   DOB - September 21, 1928  Admit Date - 09/29/2017  Outpatient Primary MD for the patient is Via, [name], MD  Referring MD/NP/PA:  Purvis Sheffield  Outpatient Specialists:  Will Camnitz Dr. Krista Blue  Patient coming from: home  Chief Complaint  Patient presents with  . Chest Pain      HPI:    Randall Callahan  is a 83 y.o. male, w  CAD, CM (EF 44%), Dm2 Hypertension, apparently c/o jaw pain and numbness in bilateral arms.    In ED,  CXR  IMPRESSION: 1. Chronic lung changes with superimposed diffuse interstitial prominence, compatible with mild pulmonary interstitial edema. 2. Aortic atherosclerosis.  BNP 205.7  Ekg paced at 100  Wbc 11.1 Hgb 14.7, Plt 234 Na 133, K 5.2  Glucose 202 Bun 17, Creatinine 1.29 Trop 0.04  Pt will be admitted for CHF   Review of systems:    In addition to the HPI above,  No Fever-chills, No Headache, No changes with Vision or hearing, No problems swallowing food or Liquids,, No Abdominal pain, No Nausea or Vommitting, Bowel movements are regular, No Blood in stool or Urine, No dysuria, No new skin rashes or bruises, No new joints pains-aches,  No new weakness, tingling, numbness in any extremity, No recent weight gain or loss, No polyuria, polydypsia or polyphagia, No significant Mental Stressors.  A full 10 point Review of Systems was done, except as stated above, all other Review of Systems were negative.   With Past History of the following :    Past Medical History:  Diagnosis Date  . Angina pectoris, unstable ([place])   . Angiomyolipoma of kidney   . ASCVD (arteriosclerotic cardiovascular disease)   . Bradycardia   . Cardiomyopathy ([place])   . Chest pain   . Chest tightness   . Coronary artery disease   . Diabetes mellitus without  complication ([place])   . Diverticulosis   . Hx of sick sinus syndrome   . Hypertension   . MCI (mild cognitive impairment)   . NSTEMI (non-ST elevation myocardial infarction) ([place])   . Peripheral neuropathy   . Schatzki's ring   . Squamous cell carcinoma in situ of skin       Past Surgical History:  Procedure Laterality Date  . CARDIAC CATHETERIZATION    . CORONARY ANGIOGRAPHY    . JOINT REPLACEMENT    . VASCULAR SURGERY        Social History:     Social History   Tobacco Use  . Smoking status: Former Smoker    Years: 2.00    Types: Cigarettes  . Smokeless tobacco: Never Used  . Tobacco comment: STARTED SMOKING IN 4TH GRADE, QUIT IN 6TH  Substance Use Topics  . Alcohol use: Yes    Alcohol/week:  1.2 - 1.8 oz    Types: 1 - 2 Glasses of wine, 1 Cans of beer per week    Comment: 1-2 drinks per week     Lives - at home  Mobility - walks by self   Family History :     Family History  Problem Relation Age of Onset  . Angina Mother   . Stroke Mother   . Other Father        Car accident  . Prostate cancer Brother   . Cancer Brother       Home Medications:   Prior to Admission medications   Medication Sig Start Date End Date Taking? Authorizing Provider  aspirin EC 81 MG tablet Take 81 mg by mouth daily with breakfast.    Yes [provider]  atorvastatin (LIPITOR) 40 MG tablet Take 40 mg by mouth at bedtime.    Yes [provider]  clopidogrel (PLAVIX) 75 MG tablet Take 75 mg by mouth daily with breakfast.    Yes [provider]  isosorbide mononitrate (IMDUR) 60 MG 24 hr tablet Take 1 tablet (60 mg total) by mouth daily. Patient taking differently: Take 120 mg by mouth daily.  03/16/17  Yes Camnitz, Will Hassell Done, MD  lisinopril (PRINIVIL,ZESTRIL) 2.5 MG tablet Take 2.5 mg by mouth daily with breakfast.    Yes [provider]  metFORMIN (GLUCOPHAGE) 1000 MG tablet Take 1,000 mg by mouth 2 (two) times daily with a meal.   Yes  [provider]  metoprolol succinate (TOPROL-XL) 50 MG 24 hr tablet Take 75 mg by mouth daily after breakfast. Take with or immediately following a meal.   Yes [provider]  nitroGLYCERIN (NITROSTAT) 0.4 MG SL tablet Place 0.4 mg under the tongue every 5 (five) minutes as needed for chest pain. Up to 3 doses for chest pain. If pain has not subsided after 3 doses go to the ER.   Yes [provider]  Omega-3 Fatty Acids (FISH OIL PO) Take 1,600 mg by mouth daily.   Yes [provider]     Allergies:    No Known Allergies   Physical Exam:   Vitals  Blood pressure 116/64, pulse 79, temperature 98.4 F (36.9 C), temperature source Oral, resp. rate (!) 23, height 6' (1.829 m), weight 94.3 kg (208 lb), SpO2 96 %.   1. General  lying in bed in NAD,   2. Normal affect and insight, Not Suicidal or Homicidal, Awake Alert, Oriented X 3.  3. No F.N deficits, ALL C.Nerves Intact, Strength 5/5 all 4 extremities, Sensation intact all 4 extremities, Plantars down going.  4. Ears and Eyes appear Normal, Conjunctivae clear, PERRLA. Moist Oral Mucosa.  5. Supple Neck, No JVD, No cervical lymphadenopathy appriciated, No Carotid Bruits.  6. Symmetrical Chest wall movement, Good air movement bilaterally, CTAB.  7. RRR, No Gallops, Rubs or Murmurs, No Parasternal Heave.  8. Positive Bowel Sounds, Abdomen Soft, No tenderness, No organomegaly appriciated,No rebound -guarding or rigidity.  9.  No Cyanosis, Normal Skin Turgor, No Skin Rash or Bruise.  10. Good muscle tone,  joints appear normal , no effusions, Normal ROM.  11. No Palpable Lymph Nodes in Neck or Axillae     Data Review:    CBC Recent Labs  Lab 09/29/17 0109  WBC 11.1*  HGB 14.7  HCT 43.6  PLT 234  MCV 87.2  MCH 29.4  MCHC 33.7  RDW 14.8   ------------------------------------------------------------------------------------------------------------------  Chemistries  Recent Labs  Lab 09/29/17 0109  NA 133*  K 5.2*  CL 101  CO2 18*  GLUCOSE 202*  BUN 17  CREATININE 1.29*  CALCIUM 9.3   ------------------------------------------------------------------------------------------------------------------ estimated creatinine clearance is 48.1 mL/min (A) (by C-G formula based on SCr of 1.29 mg/dL (H)). ------------------------------------------------------------------------------------------------------------------ No results for input(s): TSH, T4TOTAL, T3FREE, THYROIDAB in the last 72 hours.  Invalid input(s): FREET3  Coagulation profile No results for input(s): INR, PROTIME in the last 168 hours. ------------------------------------------------------------------------------------------------------------------- No results for input(s): DDIMER in the last 72 hours. -------------------------------------------------------------------------------------------------------------------  Cardiac Enzymes No results for input(s): CKMB, TROPONINI, MYOGLOBIN in the last 168 hours.  Invalid input(s): CK ------------------------------------------------------------------------------------------------------------------    Component Value Date/Time   BNP 205.7 (H) 09/29/2017 0109     ---------------------------------------------------------------------------------------------------------------  Urinalysis No results found for: COLORURINE, APPEARANCEUR, LABSPEC, PHURINE, GLUCOSEU, HGBUR, BILIRUBINUR, KETONESUR, PROTEINUR, UROBILINOGEN, NITRITE, LEUKOCYTESUR  ----------------------------------------------------------------------------------------------------------------   Imaging Results:    Dg Chest 2 View  Result Date: 09/29/2017 CLINICAL DATA:  Initial evaluation for acute chest pain. Job discomfort, bilateral upper arm pain. EXAM: CHEST  2 VIEW COMPARISON:  Prior radiograph from 02/17/2017. FINDINGS: The cardiac and mediastinal silhouettes are stable in size and  contour, and remain within normal limits. Left-sided pacemaker/AICD in place. Aortic atherosclerosis. Lungs are mildly hyperinflated. Underlying chronic lung changes noted, grossly similar to previous. Superimposed diffuse vascular and interstitial prominence, consistent with pulmonary interstitial edema. Evaluation for pleural effusion limited as the costophrenic angles are incompletely visualized. No consolidative airspace disease. No pneumothorax. No acute osseus abnormality.  Osteopenia. IMPRESSION: 1. Chronic lung changes with superimposed diffuse interstitial prominence, compatible with mild pulmonary interstitial edema. 2. Aortic atherosclerosis. Electronically Signed   By: Jeannine Boga M.D.   On: 09/29/2017 02:43      Assessment & Plan:    Principal Problem:   CHF (congestive heart failure) (HCC) Active Problems:   Type 2 diabetes mellitus without complications (HCC)   Hyperkalemia   Hyponatremia    CHF (EF 44%) Tele Trop I q6h x3 tsh Check echo Lasix 40mg  iv qday Cont imdur, cont metoprolol Cont lisinopril Cardiology consulted by email for am consult Appreciate input  CAD Cont aspirin Cont plavix Cont lipitor Cont metoprolol  Dm2 fsbs ac and qhs, ISS  DVT Prophylaxis  Lovenox - SCDs   AM Labs Ordered, also please review Full Orders  Family Communication: Admission, patients condition and plan of care including tests being ordered have been discussed with the patient who indicate understanding and agree with the plan and Code Status.  Code Status FULL CODE  Likely DC to  home  Condition GUARDED    Consults called:  Cardiology by email  Admission status: inpatient  Time spent in minutes : 45      Jani Gravel M.D on 09/29/2017 at 4:35 AM  Between 7pm to 7am - Pager - [phone]. After 7am go to [email or web address] - password [facility]  [facility] - Office  [phone]

## 2017-09-29 NOTE — ED Notes (Signed)
Pt placed in hospital bed for comfort. Pt and family updated on status of holding in ED

## 2017-09-29 NOTE — Progress Notes (Signed)
ANTICOAGULATION CONSULT NOTE  Pharmacy Consult for heparin Indication: chest pain/ACS  No Known Allergies  Patient Measurements: Height: 6' (182.9 cm) Weight: 204 lb 4.8 oz (92.7 kg) IBW/kg (Calculated) : 77.6 Heparin Dosing Weight: 94.3kg  Vital Signs: Temp: 98.4 F (36.9 C) (12/04 1922) Temp Source: Oral (12/04 1922) BP: 97/55 (12/04 1922) Pulse Rate: 66 (12/04 1922)  Labs: Recent Labs    09/29/17 0109 09/29/17 0514 09/29/17 1223 09/29/17 1840  HGB 14.7  --   --   --   HCT 43.6  --   --   --   PLT 234  --   --   --   HEPARINUNFRC  --   --   --  0.47  CREATININE 1.29*  --   --   --   TROPONINI  --  0.67* 3.83*  --     Estimated Creatinine Clearance: 44.3 mL/min (A) (by C-G formula based on SCr of 1.29 mg/dL (H)).   Medical History: Past Medical History:  Diagnosis Date  . Angina pectoris, unstable ([place])   . Angiomyolipoma of kidney   . ASCVD (arteriosclerotic cardiovascular disease)   . Bradycardia   . Cardiomyopathy ([place])   . Chest pain   . Chest tightness   . Coronary artery disease   . Diabetes mellitus without complication ([place])   . Diverticulosis   . Hx of sick sinus syndrome   . Hypertension   . MCI (mild cognitive impairment)   . NSTEMI (non-ST elevation myocardial infarction) ([place])   . Peripheral neuropathy   . Schatzki's ring   . Squamous cell carcinoma in situ of skin     Medications:  Infusions:  . sodium chloride    . sodium chloride    . sodium chloride    . [START ON 09/30/2017] sodium chloride     Followed by  . [START ON 09/30/2017] sodium chloride    . heparin 1,200 Units/hr (09/29/17 1112)    Assessment: 37 yom presented to the ED with CP. Troponin elevated and now on IV heparin. No anticoagulation PTA. Plans noted for  cath -heparin level is 0.47 and at goal  Goal of Therapy:  Heparin level 0.3-0.7 units/ml Monitor platelets by anticoagulation protocol: Yes   Plan: No heparin changes needed Daily heparin level and  CBC  Hildred Laser, Pharm D 09/29/2017 8:17 PM

## 2017-09-29 NOTE — ED Triage Notes (Signed)
Pt from home via [facility]. C/o jaw discomfort & bilateral upper arm pain that's similar to his previous MI. Pt has hx of previous mi. Endorses nausea, but denies any other symptoms. Pt has a pacemaker and is V-paced. Recently treated for PNA. Denies any pain @ this time. Took 3 NTG prior to EMS, EMS gave pt 1 NTG, 324 AS & 4mg  Zofran IV. A&O x4 on arrival.

## 2017-09-29 NOTE — ED Notes (Signed)
Pt meal tray not received at this time. Service response contacted and new order placed at this time

## 2017-09-29 NOTE — ED Provider Notes (Signed)
[facility] EMERGENCY DEPARTMENT Provider Note   CSN: [id number] Arrival date & time: 09/29/17  0054     History   Chief Complaint Chief Complaint  Patient presents with  . Chest Pain    HPI Randall Callahan is a 83 y.o. male.  The history is provided by the patient and a relative.  Palpitations   This is a new problem. Episode onset: earlier tonight. The problem occurs constantly. The problem has been gradually improving. Associated symptoms include nausea, cough and shortness of breath. Pertinent negatives include no fever, no chest pain, no syncope and no lower extremity edema. Risk factors include being male.  Patient reports earlier in the night he V and felt his heart was racing, felt a numbness, weird sensation in his left arm and felt nausea He reports this feels similar to prior MI He also mentions some jaw discomfort as well Apparently in route he was given aspirin and vomited with EMS  Currently, patient denies any active chest pain Patient does have a pacemaker in place He endorses a long history of CAD  Past Medical History:  Diagnosis Date  . Angina pectoris, unstable ([place])   . Angiomyolipoma of kidney   . ASCVD (arteriosclerotic cardiovascular disease)   . Bradycardia   . Cardiomyopathy ([place])   . Chest pain   . Chest tightness   . Coronary artery disease   . Diabetes mellitus without complication ([place])   . Diverticulosis   . Hx of sick sinus syndrome   . Hypertension   . MCI (mild cognitive impairment)   . NSTEMI (non-ST elevation myocardial infarction) ([place])   . Peripheral neuropathy   . Schatzki's ring   . Squamous cell carcinoma in situ of skin     Patient Active Problem List   Diagnosis Date Noted  . Gait abnormality 01/08/2017  . Neck pain 01/08/2017  . Low back pain 01/08/2017  . Ischemic cardiomyopathy 12/17/2016  . Coronary artery disease involving native coronary artery of native heart with angina pectoris ([place])  12/17/2016  . Type 2 diabetes mellitus without complications ([place]) 81/44/8185  . Hyperlipidemia 12/17/2016    Past Surgical History:  Procedure Laterality Date  . CARDIAC CATHETERIZATION    . CORONARY ANGIOGRAPHY    . JOINT REPLACEMENT    . VASCULAR SURGERY         Home Medications    Prior to Admission medications   Medication Sig Start Date End Date Taking? Authorizing Provider  aspirin EC 81 MG tablet Take 81 mg by mouth daily with breakfast.     [provider]  atorvastatin (LIPITOR) 40 MG tablet Take 40 mg by mouth at bedtime.     [provider]  bifidobacterium infantis (ALIGN) capsule Take 1 capsule by mouth daily with breakfast.    [provider]  bismuth subsalicylate (PEPTO BISMOL) 262 MG chewable tablet Chew 262-524 mg by mouth as needed for indigestion or diarrhea or loose stools.     [provider]  clopidogrel (PLAVIX) 75 MG tablet Take 75 mg by mouth daily with breakfast.     [provider]  isosorbide mononitrate (IMDUR) 60 MG 24 hr tablet Take 1 tablet (60 mg total) by mouth daily. 03/16/17   Camnitz, Ocie Doyne, MD  lisinopril (PRINIVIL,ZESTRIL) 2.5 MG tablet Take 2.5 mg by mouth daily with breakfast.     [provider]  metFORMIN (GLUCOPHAGE) 1000 MG tablet Take 1,000 mg by mouth 2 (two) times daily with a meal.  [provider]  metoprolol succinate (TOPROL-XL) 50 MG 24 hr tablet Take 75 mg by mouth daily after breakfast. Take with or immediately following a meal.    [provider]  nitroGLYCERIN (NITROSTAT) 0.4 MG SL tablet Place 0.4 mg under the tongue every 5 (five) minutes as needed for chest pain. Up to 3 doses for chest pain. If pain has not subsided after 3 doses go to the ER.    [provider]  Omega-3 Fatty Acids (FISH OIL PO) Take 1,600 mg by mouth daily.    [provider]  pregabalin (LYRICA) 150 MG capsule Take 150 mg by mouth 2 (two) times daily.     [provider]  Wheat Dextrin (BENEFIBER) POWD Take 15 mLs by mouth daily with breakfast. Take as directed     [provider]    Family History Family History  Problem Relation Age of Onset  . Angina Mother   . Stroke Mother   . Other Father        Car accident  . Prostate cancer Brother   . Cancer Brother     Social History Social History   Tobacco Use  . Smoking status: Former Smoker    Years: 2.00    Types: Cigarettes  . Smokeless tobacco: Never Used  . Tobacco comment: STARTED SMOKING IN 4TH GRADE, QUIT IN 6TH  Substance Use Topics  . Alcohol use: Yes    Alcohol/week: 1.2 - 1.8 oz    Types: 1 - 2 Glasses of wine, 1 Cans of beer per week    Comment: 1-2 drinks per week  . Drug use: No     Allergies   Patient has no known allergies.   Review of Systems Review of Systems  Constitutional: Negative for fever.  Respiratory: Positive for cough and shortness of breath.   Cardiovascular: Positive for palpitations. Negative for chest pain, leg swelling and syncope.  Gastrointestinal: Positive for nausea.  Neurological: Negative for syncope.  All other systems reviewed and are negative.    Physical Exam Updated Vital Signs BP 116/61   Pulse (!) 103   Resp (!) 31   Ht 1.829 m (6')   Wt 94.3 kg (208 lb)   SpO2 95%   BMI 28.21 kg/m   Physical Exam  CONSTITUTIONAL: Elderly, no acute distress HEAD: Normocephalic/atraumatic EYES: EOMI ENMT: Mucous membranes moist NECK: supple no meningeal signs SPINE/BACK:entire spine nontender CV: S1/S2 noted, no loud murmurs LUNGS: crackles bilaterally, no apparent distress ABDOMEN: soft, nontender GU:no cva tenderness NEURO: Pt is awake/alert/appropriate, moves all extremitiesx4.  No facial droop.   EXTREMITIES: pulses normal/equal, full ROM, no LE edema SKIN: warm, color normal PSYCH: no abnormalities of mood noted, alert and oriented to situation  ED Treatments / Results  Labs (all labs ordered  are listed, but only abnormal results are displayed) Labs Reviewed  BASIC METABOLIC PANEL - Abnormal; Notable for the following components:      Result Value   Sodium 133 (*)    Potassium 5.2 (*)    CO2 18 (*)    Glucose, Bld 202 (*)    Creatinine, Ser 1.29 (*)    GFR calc non Af Amer 48 (*)    GFR calc Af Amer 56 (*)    All other components within normal limits  CBC - Abnormal; Notable for the following components:   WBC 11.1 (*)    All other components within normal limits  BRAIN NATRIURETIC PEPTIDE - Abnormal; Notable for the  following components:   B Natriuretic Peptide 205.7 (*)    All other components within normal limits  I-STAT TROPONIN, ED    EKG  EKG Interpretation  Date/Time:  Tuesday September 29 2017 01:09:03 EST Ventricular Rate:  104 PR Interval:    QRS Duration: 211 QT Interval:  395 QTC Calculation: 520 R Axis:   -65 Text Interpretation:  suspect paced rhythm Ventricular premature complex LVH with IVCD, LAD and secondary repol abnrm Prolonged QT interval Confirmed by Ripley Fraise [phone]) on 09/29/2017 1:18:39 AM       Radiology Dg Chest 2 View  Result Date: 09/29/2017 CLINICAL DATA:  Initial evaluation for acute chest pain. Job discomfort, bilateral upper arm pain. EXAM: CHEST  2 VIEW COMPARISON:  Prior radiograph from 02/17/2017. FINDINGS: The cardiac and mediastinal silhouettes are stable in size and contour, and remain within normal limits. Left-sided pacemaker/AICD in place. Aortic atherosclerosis. Lungs are mildly hyperinflated. Underlying chronic lung changes noted, grossly similar to previous. Superimposed diffuse vascular and interstitial prominence, consistent with pulmonary interstitial edema. Evaluation for pleural effusion limited as the costophrenic angles are incompletely visualized. No consolidative airspace disease. No pneumothorax. No acute osseus abnormality.  Osteopenia. IMPRESSION: 1. Chronic lung changes with superimposed diffuse  interstitial prominence, compatible with mild pulmonary interstitial edema. 2. Aortic atherosclerosis. Electronically Signed   By: Jeannine Boga M.D.   On: 09/29/2017 02:43    Procedures Procedures (including critical care time)  Medications Ordered in ED Medications  furosemide (LASIX) injection 40 mg (40 mg Intravenous Given 09/29/17 0353)     Initial Impression / Assessment and Plan / ED Course  I have reviewed the triage vital signs and the nursing notes.  Pertinent labs & imaging results that were available during my care of the patient were reviewed by me and considered in my medical decision making (see chart for details).     2:23 AM Pt stable Symptom free at this time D/w St Jude rep, pacemaker has been interrogated and no acute findings noted Awaiting imaging report patient 4:05 AM Patient mildly tachypneic  Patient also has evidence of edema on x-ray We discussed possibility of attempt of starting Lasix, but patient prefers to be admitted He will be admitted for monitoring, diuresis Discussed with Dr. Jani Gravel for admission to triad hospitalist Final Clinical Impressions(s) / ED Diagnoses   Final diagnoses:  Acute systolic congestive heart failure [facility])    ED Discharge Orders    None       Ripley Fraise, MD 09/29/17 [phone]

## 2017-09-29 NOTE — Progress Notes (Addendum)
Patient was admitted earlier this morning.  H&P reviewed.  Patient seen and examined.  Discussed with patient and family.  S: Denies any chest discomfort or jaw discomfort at this time.  Denies any shortness of breath.  O: Vital signs reviewed.  Diminished air entry at the bases.  No definite wheezing.  Few crackles.  Normal effort. S1-S2 is normal regular.  No S3 or S4.  No rubs murmurs or bruit Abdomen is soft.  Nontender nondistended.  Bowel sounds are present.  No masses or organomegaly    Labs reviewed.  Troponin noted to be 0.67.  TSH 1.137.  Potassium was 5.2 at the time of admission but there was hemolysis noted.   A/P: Chest pain in the setting of coronary artery disease status post previous cardiac catheterization.   Patient presented with symptoms concerning for angina.  He has elevation in troponin.  Most likely this is non-ST elevation MI.  Cardiology has been consulted.  Patient is on aspirin and Plavix beta-blocker nitrates and ACE inhibitor.  Further management per cardiology.  History of ischemic cardiomyopathy Does not appear to be decompensated currently.  He was given Lasix overnight.  Hold off on further doses for now.  We will see what cardiology has to say.  No echocardiogram noted in our system.  History of sick sinus syndrome status post pacemaker placement Per cardiology.  Mild hyperkalemia, likely hemolyzed sample We will recheck labs tomorrow.  Diabetes mellitus type 2 Follow with CBGs.  SSI.  We will continue to follow.  Bonnielee Haff 09/29/2017

## 2017-09-29 NOTE — H&P (View-Only) (Signed)
Cardiology Consultation:   Patient ID: Randall Callahan; [id number]; 1928-02-07   Admit date: 09/29/2017 Date of Consult: 09/29/2017  Primary Care Provider: Dineen Kid, MD Primary Cardiologist: Dr. Curt Bears Primary Electrophysiologist:     Patient Profile:   Randall Callahan is a 83 y.o. male with a hx of sick sinus syndrome s/p dual chamber PPM (2015), ischemic cardiomyopathy, CAD s/p LAD and OM1 stenting (2003), LHC with occluded stent in 2008, NSTEMI 08/2016 resulting in medical management with beta blockers and long-acting nitrates, STEMI (troponin 0.24) without repeat heart catheterization who is being seen today for the evaluation of chest pain at the request of Dr. Maryland Pink.  History of Present Illness:   Randall Callahan was previously treated in [place], with a recent move to [place]. He has seen Dr. Curt Bears in clinic on 03/16/17. At that time, he c/o dsypnea and chest pain, although he denied chest pain during cardiac rehab. Imdur was increased at that time.  He has since returned to [place] and came back to [place] 2 weeks ago.  He presented to [facility] with chest pain with a troponin of 0.67. On my interview, he states that he rides the stationary bike 30 min daily without exertional chest pain, but notes his heart rate sometimes drops during exercise. He states that he sometimes has rapid heart rate for which he treats at home with SL nitro. Last evening around midnight, he had increased heart rate in the 100s. He took 2 SL nitros without relief and reported to [facility]. During times of increased heart rate, he experiences chest pain in his left chest that radiates to his left jaw. No other associated symptoms. This is generally relieved within 30 min with nitro and rest. He is currently resting flat and has not chest pain. Monitor with paced rhythm in the 60s. He has never been instructed to take an extra toprol for times of rapid heart rate.   Past Medical History:  Diagnosis Date  . Angina  pectoris, unstable ([place])   . Angiomyolipoma of kidney   . ASCVD (arteriosclerotic cardiovascular disease)   . Bradycardia   . Cardiomyopathy ([place])   . Chest pain   . Chest tightness   . Coronary artery disease   . Diabetes mellitus without complication ([place])   . Diverticulosis   . Hx of sick sinus syndrome   . Hypertension   . MCI (mild cognitive impairment)   . NSTEMI (non-ST elevation myocardial infarction) ([place])   . Peripheral neuropathy   . Schatzki's ring   . Squamous cell carcinoma in situ of skin     Past Surgical History:  Procedure Laterality Date  . CARDIAC CATHETERIZATION    . CORONARY ANGIOGRAPHY    . JOINT REPLACEMENT    . VASCULAR SURGERY       Home Medications:  Prior to Admission medications   Medication Sig Start Date End Date Taking? Authorizing Provider  aspirin EC 81 MG tablet Take 81 mg by mouth daily with breakfast.    Yes [provider]  atorvastatin (LIPITOR) 40 MG tablet Take 40 mg by mouth at bedtime.    Yes [provider]  clopidogrel (PLAVIX) 75 MG tablet Take 75 mg by mouth daily with breakfast.    Yes [provider]  isosorbide mononitrate (IMDUR) 60 MG 24 hr tablet Take 1 tablet (60 mg total) by mouth daily. Patient taking differently: Take 120 mg by mouth daily.  03/16/17  Yes Camnitz, Will Hassell Done, MD  lisinopril (PRINIVIL,ZESTRIL) 2.5 MG tablet Take  2.5 mg by mouth daily with breakfast.    Yes [provider]  metFORMIN (GLUCOPHAGE) 1000 MG tablet Take 1,000 mg by mouth 2 (two) times daily with a meal.   Yes [provider]  metoprolol succinate (TOPROL-XL) 50 MG 24 hr tablet Take 75 mg by mouth daily after breakfast. Take with or immediately following a meal.   Yes [provider]  nitroGLYCERIN (NITROSTAT) 0.4 MG SL tablet Place 0.4 mg under the tongue every 5 (five) minutes as needed for chest pain. Up to 3 doses for chest pain. If pain has not subsided after 3 doses go to the ER.   Yes  [provider]  Omega-3 Fatty Acids (FISH OIL PO) Take 1,600 mg by mouth daily.   Yes [provider]    Inpatient Medications: Scheduled Meds: . aspirin EC  81 mg Oral Q breakfast  . atorvastatin  40 mg Oral QHS  . clopidogrel  75 mg Oral Q breakfast  . enoxaparin (LOVENOX) injection  40 mg Subcutaneous Q24H  . furosemide  40 mg Intravenous Daily  . insulin aspart  0-5 Units Subcutaneous QHS  . insulin aspart  0-9 Units Subcutaneous TID WC  . isosorbide mononitrate  120 mg Oral Daily  . lisinopril  2.5 mg Oral Q breakfast  . metoprolol succinate  75 mg Oral QPC breakfast  . sodium chloride flush  3 mL Intravenous Q12H   Continuous Infusions: . sodium chloride     PRN Meds: sodium chloride, acetaminophen **OR** acetaminophen, nitroGLYCERIN, sodium chloride flush  Allergies:   No Known Allergies  Social History:   Social History   Socioeconomic History  . Marital status: Divorced    Spouse name: Not on file  . Number of children: 6  . Years of education: some college  . Highest education level: Not on file  Social Needs  . Financial resource strain: Not on file  . Food insecurity - worry: Not on file  . Food insecurity - inability: Not on file  . Transportation needs - medical: Not on file  . Transportation needs - non-medical: Not on file  Occupational History  . Occupation: Retired  Tobacco Use  . Smoking status: Former Smoker    Years: 2.00    Types: Cigarettes  . Smokeless tobacco: Never Used  . Tobacco comment: STARTED SMOKING IN 4TH GRADE, QUIT IN 6TH  Substance and Sexual Activity  . Alcohol use: Yes    Alcohol/week: 1.2 - 1.8 oz    Types: 1 - 2 Glasses of wine, 1 Cans of beer per week    Comment: 1-2 drinks per week  . Drug use: No  . Sexual activity: No  Other Topics Concern  . Not on file  Social History Narrative   Lives at home with son and daughter-in-law.   Right-handed.   2 cups caffeine per day.    Family History:     Family History  Problem Relation Age of Onset  . Angina Mother   . Stroke Mother   . Other Father        Car accident  . Prostate cancer Brother   . Cancer Brother      ROS:  Please see the history of present illness.  ROS  All other ROS reviewed and negative.     Physical Exam/Data:   Vitals:   09/29/17 0230 09/29/17 0330 09/29/17 0500 09/29/17 0600  BP: 113/63 116/64 106/66 111/63  Pulse: 85 79 79 76  Resp: (!) 26 (!)  23 (!) 22 20  Temp:      TempSrc:      SpO2: 93% 96% 96% 92%  Weight:      Height:        Intake/Output Summary (Last 24 hours) at 09/29/2017 0736 Last data filed at 09/29/2017 1610 Gross per 24 hour  Intake -  Output 600 ml  Net -600 ml   Filed Weights   09/29/17 0105  Weight: 208 lb (94.3 kg)   Body mass index is 28.21 kg/m.  General:  Well nourished, well developed, in no acute distress HEENT: normal Neck: no JVD Vascular: No carotid bruits Cardiac:  normal S1, S2; RRR; no murmur Lungs:  clear to auscultation bilaterally, no wheezing, rhonchi or rales  Abd: soft, nontender, no hepatomegaly  Ext: no edema Musculoskeletal:  No deformities, BUE and BLE strength normal and equal Skin: warm and dry  Neuro:  CNs 2-12 intact, no focal abnormalities noted Psych:  Normal affect   EKG:  The EKG was personally reviewed and demonstrates:  Paced rhythm Telemetry:  Telemetry was personally reviewed and demonstrates:  Vpaced  Relevant CV Studies:  Echo pending  Cardiac cath 08/27/16  -Left main mild diffuse disease -LAD moderate diffuse disease, the mid segment of the LAD has a single discrete total occlusion. Distal flow was via collaterals from the RCA. The distal vessel was large. The lesion represented in-stent restenosis following a prior coronary stent intervention. -Left circumflex: Mild diffuse disease in the entire vessel. There was a 90+ percent diffuse stenosis of the proximal segment of the first OM. The arm was moderate size. Distal  flow was decreased and was via the native vessel and collaterals from the RCA.  -Right coronary artery: Mild diffuse disease. Moderate diffuse disease of the entire vessel segment of the right PDA.  -[place]: Moderate diffuse disease of the proximal segment.    Laboratory Data:  Chemistry Recent Labs  Lab 09/29/17 0109  NA 133*  K 5.2*  CL 101  CO2 18*  GLUCOSE 202*  BUN 17  CREATININE 1.29*  CALCIUM 9.3  GFRNONAA 48*  GFRAA 56*  ANIONGAP 14    No results for input(s): PROT, ALBUMIN, AST, ALT, ALKPHOS, BILITOT in the last 168 hours. Hematology Recent Labs  Lab 09/29/17 0109  WBC 11.1*  RBC 5.00  HGB 14.7  HCT 43.6  MCV 87.2  MCH 29.4  MCHC 33.7  RDW 14.8  PLT 234   Cardiac Enzymes Recent Labs  Lab 09/29/17 0514  TROPONINI 0.67*    Recent Labs  Lab 09/29/17 0127  TROPIPOC 0.04    BNP Recent Labs  Lab 09/29/17 0109  BNP 205.7*    DDimer No results for input(s): DDIMER in the last 168 hours.  Radiology/Studies:  Dg Chest 2 View  Result Date: 09/29/2017 CLINICAL DATA:  Initial evaluation for acute chest pain. Job discomfort, bilateral upper arm pain. EXAM: CHEST  2 VIEW COMPARISON:  Prior radiograph from 02/17/2017. FINDINGS: The cardiac and mediastinal silhouettes are stable in size and contour, and remain within normal limits. Left-sided pacemaker/AICD in place. Aortic atherosclerosis. Lungs are mildly hyperinflated. Underlying chronic lung changes noted, grossly similar to previous. Superimposed diffuse vascular and interstitial prominence, consistent with pulmonary interstitial edema. Evaluation for pleural effusion limited as the costophrenic angles are incompletely visualized. No consolidative airspace disease. No pneumothorax. No acute osseus abnormality.  Osteopenia. IMPRESSION: 1. Chronic lung changes with superimposed diffuse interstitial prominence, compatible with mild pulmonary interstitial edema. 2. Aortic atherosclerosis. Electronically Signed  By: Jeannine Boga M.D.   On: 09/29/2017 02:43    Assessment and Plan:   1. Chest pain, CAD s/p heart cath with multi-vessel disease - on ASA and plavix - on beta blocker, imdur, and lisinopril - troponin 0.67 - EKG with paced rhythm, difficult to tell if there are ischemic changes - elevated troponin may be related to demand ischemia in the setting of tachycardia, although he has known disease that has been treated medically - he states he because dyspneic with transferrin to a new bed overnight and with a BM at 0200  2. Ischemic cardiomyopathy - BNP mildly elevated 205.7 - does not appear to be in an acute exacerbation  3. Sick sinus syndrome - St Jude PPM - functioning properly 02/2017 - will interrogate PPM to view tachycardic event last evening  4. HLD - continue statin    For questions or updates, please contact [place] Please consult [email or web address] for contact info under Cardiology/STEMI.   Signed, Tami Lin Duke, PA  09/29/2017 7:36 AM  As above, patient seen and examined.  Briefly he is an 83 year old male with past medical history of coronary artery disease, ischemic cardiomyopathy with LV function unknown, prior permanent pacemaker, diabetes mellitus, hypertension for evaluation of non-ST elevation myocardial infarction.  Patient states that for the past 1 year he has noticed elevated heart rate with activities followed by chest heaviness and jaw pain.  Symptoms resolve with rest.  Last evening he developed the symptoms while in bed.  The pain was not associated with nausea, dyspnea or diaphoresis.  It lasted 2 hours and resolved.  He is presently pain-free.  Laboratories include troponin of 0.67.  Creatinine 1.29.  Electrocardiogram shows sinus tachycardia with PVCs and ventricular pacing.  1 non-ST elevation myocardial infarction-patient presents with symptoms of unstable angina progressing to rest pain last evening.  Troponin is elevated consistent with  non-ST elevation myocardial infarction.  Continue home medications including aspirin, Plavix, statin and metoprolol.  Plan cardiac catheterization to see if there is a lesion amenable to PCI.  The risks and benefits were discussed including myocardial infarction, CVA and death and he agrees to proceed.  He also has baseline renal insufficiency.  We will gently hydrate prior to procedure.  Hold Glucophage for 48 hours following procedure.  Hold Lasix prior to procedure.  2 ischemic cardiomyopathy-continue ACE inhibitor and beta-blocker.  3 status post pacemaker-patient describes periods of elevated heart rate with activities.  Will have device interrogated.  4 chronic stage III kidney disease-follow renal function closely after catheterization.  Kirk Ruths, MD

## 2017-09-29 NOTE — Progress Notes (Signed)
ANTICOAGULATION CONSULT NOTE - Initial Consult  Pharmacy Consult for heparin Indication: chest pain/ACS  No Known Allergies  Patient Measurements: Height: 6' (182.9 cm) Weight: 208 lb (94.3 kg) IBW/kg (Calculated) : 77.6 Heparin Dosing Weight: 94.3kg  Vital Signs: Temp: 98.4 F (36.9 C) (12/04 0115) Temp Source: Oral (12/04 0115) BP: 121/60 (12/04 1000) Pulse Rate: 80 (12/04 1000)  Labs: Recent Labs    09/29/17 0109 09/29/17 0514  HGB 14.7  --   HCT 43.6  --   PLT 234  --   CREATININE 1.29*  --   TROPONINI  --  0.67*    Estimated Creatinine Clearance: 48.1 mL/min (A) (by C-G formula based on SCr of 1.29 mg/dL (H)).   Medical History: Past Medical History:  Diagnosis Date  . Angina pectoris, unstable ([place])   . Angiomyolipoma of kidney   . ASCVD (arteriosclerotic cardiovascular disease)   . Bradycardia   . Cardiomyopathy ([place])   . Chest pain   . Chest tightness   . Coronary artery disease   . Diabetes mellitus without complication ([place])   . Diverticulosis   . Hx of sick sinus syndrome   . Hypertension   . MCI (mild cognitive impairment)   . NSTEMI (non-ST elevation myocardial infarction) ([place])   . Peripheral neuropathy   . Schatzki's ring   . Squamous cell carcinoma in situ of skin     Medications:  Infusions:  . sodium chloride    . heparin      Assessment: 77 yom presented to the ED with CP. Troponin elevated and now starting IV heparin. Baseline CBC is WNL and he is not on anticoagulation PTA.   Goal of Therapy:  Heparin level 0.3-0.7 units/ml Monitor platelets by anticoagulation protocol: Yes   Plan:  Heparin bolus 4000 units IV x 1 Heparin gtt 1200 units/hr Check an 8 hr heparin level Daily heparin level and CBC  Randall Callahan, Rande Lawman 09/29/2017,10:40 AM

## 2017-09-29 NOTE — ED Notes (Signed)
Pacemaker interrogated. [employer] rep called and spoke w/ EDP. [employer] rep sts pacemaker seems to be functioning fine, pt base rhythm is ST.

## 2017-09-29 NOTE — Progress Notes (Signed)
consent form is sign, second IV is in left forearm.

## 2017-09-30 ENCOUNTER — Encounter (HOSPITAL_COMMUNITY)
Admission: EM | Disposition: A | Discharge status: Home / Self Care | Payer: Self-pay | Source: Home / Self Care | Attending: Internal Medicine

## 2017-09-30 ENCOUNTER — Encounter (HOSPITAL_COMMUNITY): Payer: Self-pay | Admitting: Cardiovascular Disease

## 2017-09-30 ENCOUNTER — Other Ambulatory Visit (HOSPITAL_COMMUNITY): Payer: Medicare Other

## 2017-09-30 DIAGNOSIS — I255 Ischemic cardiomyopathy: Secondary | ICD-10-CM

## 2017-09-30 DIAGNOSIS — I25110 Atherosclerotic heart disease of native coronary artery with unstable angina pectoris: Secondary | ICD-10-CM

## 2017-09-30 DIAGNOSIS — I5021 Acute systolic (congestive) heart failure: Secondary | ICD-10-CM

## 2017-09-30 DIAGNOSIS — N183 Chronic kidney disease, stage 3 (moderate): Secondary | ICD-10-CM

## 2017-09-30 HISTORY — PX: LEFT HEART CATH AND CORONARY ANGIOGRAPHY: CATH118249

## 2017-09-30 LAB — GLUCOSE, CAPILLARY
GLUCOSE-CAPILLARY: 138 mg/dL — AB (ref 65–99)
GLUCOSE-CAPILLARY: 128 mg/dL — AB (ref 65–99)
Glucose-Capillary: 141 mg/dL — ABNORMAL HIGH (ref 65–99)
Glucose-Capillary: 124 mg/dL — ABNORMAL HIGH (ref 65–99)

## 2017-09-30 LAB — CBC
HEMATOCRIT: 38 % — AB (ref 39.0–52.0)
HEMOGLOBIN: 12.6 g/dL — AB (ref 13.0–17.0)
MCH: 28.6 pg (ref 26.0–34.0)
MCHC: 33.2 g/dL (ref 30.0–36.0)
MCV: 86.4 fL (ref 78.0–100.0)
Platelets: 227 10*3/uL (ref 150–400)
RBC: 4.4 MIL/uL (ref 4.22–5.81)
RDW: 14.7 % (ref 11.5–15.5)
WBC: 7 10*3/uL (ref 4.0–10.5)

## 2017-09-30 LAB — COMPREHENSIVE METABOLIC PANEL
ALBUMIN: 3.2 g/dL — AB (ref 3.5–5.0)
ALK PHOS: 81 U/L (ref 38–126)
ALT: 14 U/L — AB (ref 17–63)
AST: 40 U/L (ref 15–41)
Anion gap: 12 (ref 5–15)
BILIRUBIN TOTAL: 1.8 mg/dL — AB (ref 0.3–1.2)
BUN: 20 mg/dL (ref 6–20)
CALCIUM: 9.1 mg/dL (ref 8.9–10.3)
CO2: 24 mmol/L (ref 22–32)
CREATININE: 1.16 mg/dL (ref 0.61–1.24)
Chloride: 100 mmol/L — ABNORMAL LOW (ref 101–111)
GFR calc non Af Amer: 55 mL/min — ABNORMAL LOW (ref >60)
GLUCOSE: 150 mg/dL — AB (ref 65–99)
Potassium: 3.9 mmol/L (ref 3.5–5.1)
SODIUM: 136 mmol/L (ref 135–145)
TOTAL PROTEIN: 6 g/dL — AB (ref 6.5–8.1)

## 2017-09-30 LAB — PROTIME-INR
INR: 1.13
Prothrombin Time: 14.4 seconds (ref 11.4–15.2)

## 2017-09-30 LAB — CARDIAC CATHETERIZATION: CATHEFQUANT: 25 %

## 2017-09-30 LAB — HEPARIN LEVEL (UNFRACTIONATED): Heparin Unfractionated: 0.24 IU/mL — ABNORMAL LOW (ref 0.30–0.70)

## 2017-09-30 SURGERY — LEFT HEART CATH AND CORONARY ANGIOGRAPHY
Anesthesia: LOCAL

## 2017-09-30 MED ORDER — HEPARIN (PORCINE) IN NACL 2-0.9 UNIT/ML-% IJ SOLN
INTRAMUSCULAR | Status: AC
  Filled 2017-09-30: qty 1000

## 2017-09-30 MED ORDER — SODIUM CHLORIDE 0.9% FLUSH: 3.0000 mL | INTRAVENOUS | Status: DC | PRN

## 2017-09-30 MED ORDER — LIDOCAINE HCL (PF) 1 % IJ SOLN
INTRAMUSCULAR | Status: AC
  Filled 2017-09-30: qty 30

## 2017-09-30 MED ORDER — HEPARIN SODIUM (PORCINE) 1000 UNIT/ML IJ SOLN
INTRAMUSCULAR | Status: DC | PRN
  Administered 2017-09-30: 4000 [IU] via INTRAVENOUS

## 2017-09-30 MED ORDER — HEPARIN (PORCINE) IN NACL 2-0.9 UNIT/ML-% IJ SOLN
INTRAMUSCULAR | Status: AC | PRN
  Administered 2017-09-30: 1000 mL

## 2017-09-30 MED ORDER — SODIUM CHLORIDE 0.9 % IV SOLN: 250.0000 mL | INTRAVENOUS | Status: DC | PRN

## 2017-09-30 MED ORDER — VERAPAMIL HCL 2.5 MG/ML IV SOLN
INTRAVENOUS | Status: AC
  Filled 2017-09-30: qty 2

## 2017-09-30 MED ORDER — HYDROCORTISONE 1 % EX CREA
TOPICAL_CREAM | Freq: Two times a day (BID) | CUTANEOUS | Status: DC
  Administered 2017-09-30: 10:00:00 via TOPICAL
  Administered 2017-09-30: 1 via TOPICAL
  Administered 2017-10-01 – 2017-10-03 (×2): via TOPICAL
  Filled 2017-09-30: qty 28

## 2017-09-30 MED ORDER — LIDOCAINE HCL (PF) 1 % IJ SOLN
INTRAMUSCULAR | Status: DC | PRN
  Administered 2017-09-30: 2 mL

## 2017-09-30 MED ORDER — MIDAZOLAM HCL 2 MG/2ML IJ SOLN
INTRAMUSCULAR | Status: DC | PRN
  Administered 2017-09-30: 1 mg via INTRAVENOUS

## 2017-09-30 MED ORDER — HEPARIN (PORCINE) IN NACL 100-0.45 UNIT/ML-% IJ SOLN
1300.0000 [IU]/h | INTRAMUSCULAR | Status: DC
  Administered 2017-09-30 – 2017-10-02 (×2): 1300 [IU]/h via INTRAVENOUS
  Filled 2017-09-30 (×3): qty 250

## 2017-09-30 MED ORDER — HEPARIN SODIUM (PORCINE) 1000 UNIT/ML IJ SOLN
INTRAMUSCULAR | Status: AC
  Filled 2017-09-30: qty 1

## 2017-09-30 MED ORDER — MIDAZOLAM HCL 2 MG/2ML IJ SOLN
INTRAMUSCULAR | Status: AC
  Filled 2017-09-30: qty 2

## 2017-09-30 MED ORDER — SODIUM CHLORIDE 0.9% FLUSH
3.0000 mL | Freq: Two times a day (BID) | INTRAVENOUS | Status: DC
  Administered 2017-09-30 – 2017-10-01 (×3): 3 mL via INTRAVENOUS

## 2017-09-30 MED ORDER — IOPAMIDOL (ISOVUE-370) INJECTION 76%
INTRAVENOUS | Status: AC
  Filled 2017-09-30: qty 100

## 2017-09-30 MED ORDER — VERAPAMIL HCL 2.5 MG/ML IV SOLN
INTRAVENOUS | Status: DC | PRN
  Administered 2017-09-30: 10 mL via INTRA_ARTERIAL

## 2017-09-30 SURGICAL SUPPLY — 9 items
CATH OPTITORQUE JACKY 4.0 5F (CATHETERS) ×2 IMPLANT
DEVICE RAD COMP TR BAND LRG (VASCULAR PRODUCTS) ×2 IMPLANT
GLIDESHEATH SLEND SS 6F .021 (SHEATH) ×2 IMPLANT
GUIDEWIRE INQWIRE 1.5J.035X260 (WIRE) ×1 IMPLANT
INQWIRE 1.5J .035X260CM (WIRE) ×2
KIT HEART LEFT (KITS) ×2 IMPLANT
PACK CARDIAC CATHETERIZATION (CUSTOM PROCEDURE TRAY) ×2 IMPLANT
TRANSDUCER W/STOPCOCK (MISCELLANEOUS) ×2 IMPLANT
TUBING CIL FLEX 10 FLL-RA (TUBING) ×2 IMPLANT

## 2017-09-30 NOTE — Progress Notes (Addendum)
Pt is NPO for surgery this am since midnight, BL groin and right radial clipping is done, EKG is done, pt's vitals stable, denies chest pain SOB so far, IV fluid continue, pre procedure checklist done, will continue to monitor the patient  Palma Holter, RN

## 2017-09-30 NOTE — Progress Notes (Signed)
Patient received back to unit post cath, TR band to right radial 12 ccs air, level 0.  VSS. Patient denies any discomfort.  Son and daughter in law at bedside.

## 2017-09-30 NOTE — Plan of Care (Signed)
Patient has done well post cath, no complaints.  Patient and family waiting to speak to CVTS regarding next steps.

## 2017-09-30 NOTE — Progress Notes (Signed)
PROGRESS NOTE    Randall Callahan  QQI:[id number] DOB: March 31, 1928 DOA: 09/29/2017 PCP: Dineen Kid, MD    Brief Narrative:  Randall Callahan  is a 83 y.o. male, w  CAD, CM (EF 44%), Dm2 Hypertension, apparently c/o jaw pain and numbness in bilateral arms.       Assessment & Plan:   Principal Problem:   CHF (congestive heart failure) (HCC) Active Problems:   Type 2 diabetes mellitus without complications (HCC)   Hyperkalemia   Hyponatremia   Non-ST elevation (NSTEMI) myocardial infarction ([place])   1-N-STEMI;  Presents with jaw pain, positive troponin up to 4.  Had cath; with 2 vessels diseases. CABG vs hight risk PCI.  Cardiology recommending CVTS consult. CVTS consulted.  On IV heparin. On Aspirin, lipitor, Imdur.  Plan to repeat ECHO.    2-History of ischemic cardiomyopathy On lisinopril, metoprolol.   3-History of sick sinus syndrome status post pacemaker placement Per cardiology.  4-Diabetes mellitus type 2 Follow with CBGs.  SSI.    DVT prophylaxis: Heparin.  Code Status: full code.  Family Communication: son and daughter in law at bedside.  Disposition Plan: to be determine.    Consultants:   Cardiology  CVTS   Procedures:   Cath   Antimicrobials: none   Subjective: He denies jaw pain.  Denies chest pain and dyspnea.   Objective: Vitals:   09/30/17 1130 09/30/17 1200 09/30/17 1230 09/30/17 1300  BP: (!) 116/47 (!) 108/48 113/72 (!) 107/48  Pulse: 70 66 82 73  Resp:      Temp:      TempSrc:      SpO2: 95% 95% 96% 96%  Weight:      Height:        Intake/Output Summary (Last 24 hours) at 09/30/2017 1424 Last data filed at 09/30/2017 1100 Gross per 24 hour  Intake 1518.44 ml  Output 1900 ml  Net -381.56 ml   Filed Weights   09/29/17 0105 09/29/17 1829 09/30/17 0446  Weight: 94.3 kg (208 lb) 92.7 kg (204 lb 4.8 oz) 92.9 kg (204 lb 14.4 oz)    Examination:  General exam: Appears calm and comfortable  Respiratory system: Clear  to auscultation. Respiratory effort normal. Cardiovascular system: S1 & S2 heard, RRR. No JVD, systolic murmurs, no  rubs, gallops or clicks. No pedal edema. Gastrointestinal system: Abdomen is nondistended, soft and nontender. No organomegaly or masses felt. Normal bowel sounds heard. Central nervous system: Alert and oriented. No focal neurological deficits. Extremities: Symmetric 5 x 5 power. Skin: No rashes, lesions or ulcers   Data Reviewed: I have personally reviewed following labs and imaging studies  CBC: Recent Labs  Lab 09/29/17 0109 09/30/17 0503  WBC 11.1* 7.0  HGB 14.7 12.6*  HCT 43.6 38.0*  MCV 87.2 86.4  PLT 234 941   Basic Metabolic Panel: Recent Labs  Lab 09/29/17 0109 09/30/17 0503  NA 133* 136  K 5.2* 3.9  CL 101 100*  CO2 18* 24  GLUCOSE 202* 150*  BUN 17 20  CREATININE 1.29* 1.16  CALCIUM 9.3 9.1   GFR: Estimated Creatinine Clearance: 49.2 mL/min (by C-G formula based on SCr of 1.16 mg/dL). Liver Function Tests: Recent Labs  Lab 09/30/17 0503  AST 40  ALT 14*  ALKPHOS 81  BILITOT 1.8*  PROT 6.0*  ALBUMIN 3.2*   No results for input(s): LIPASE, AMYLASE in the last 168 hours. No results for input(s): AMMONIA in the last 168 hours. Coagulation Profile: Recent Labs  Lab 09/30/17  0503  INR 1.13   Cardiac Enzymes: Recent Labs  Lab 09/29/17 0514 09/29/17 1223 09/29/17 1840  TROPONINI 0.67* 3.83* 4.95*   BNP (last 3 results) No results for input(s): PROBNP in the last 8760 hours. HbA1C: No results for input(s): HGBA1C in the last 72 hours. CBG: Recent Labs  Lab 09/29/17 0909 09/29/17 1203 09/29/17 2107 09/30/17 0730 09/30/17 1226  GLUCAP 135* 152* 191* 141* 138*   Lipid Profile: No results for input(s): CHOL, HDL, LDLCALC, TRIG, CHOLHDL, LDLDIRECT in the last 72 hours. Thyroid Function Tests: Recent Labs    09/29/17 0514  TSH 1.137   Anemia Panel: No results for input(s): VITAMINB12, FOLATE, FERRITIN, TIBC, IRON,  RETICCTPCT in the last 72 hours. Sepsis Labs: No results for input(s): PROCALCITON, LATICACIDVEN in the last 168 hours.  No results found for this or any previous visit (from the past 240 hour(s)).       Radiology Studies: Dg Chest 2 View  Result Date: 09/29/2017 CLINICAL DATA:  Initial evaluation for acute chest pain. Job discomfort, bilateral upper arm pain. EXAM: CHEST  2 VIEW COMPARISON:  Prior radiograph from 02/17/2017. FINDINGS: The cardiac and mediastinal silhouettes are stable in size and contour, and remain within normal limits. Left-sided pacemaker/AICD in place. Aortic atherosclerosis. Lungs are mildly hyperinflated. Underlying chronic lung changes noted, grossly similar to previous. Superimposed diffuse vascular and interstitial prominence, consistent with pulmonary interstitial edema. Evaluation for pleural effusion limited as the costophrenic angles are incompletely visualized. No consolidative airspace disease. No pneumothorax. No acute osseus abnormality.  Osteopenia. IMPRESSION: 1. Chronic lung changes with superimposed diffuse interstitial prominence, compatible with mild pulmonary interstitial edema. 2. Aortic atherosclerosis. Electronically Signed   By: Jeannine Boga M.D.   On: 09/29/2017 02:43        Scheduled Meds: . aspirin EC  81 mg Oral Q breakfast  . atorvastatin  40 mg Oral QHS  . hydrocortisone cream   Topical BID  . insulin aspart  0-5 Units Subcutaneous QHS  . insulin aspart  0-9 Units Subcutaneous TID WC  . isosorbide mononitrate  120 mg Oral Daily  . lisinopril  2.5 mg Oral Q breakfast  . metoprolol succinate  75 mg Oral QPC breakfast  . sodium chloride flush  3 mL Intravenous Q12H  . sodium chloride flush  3 mL Intravenous Q12H   Continuous Infusions: . sodium chloride    . sodium chloride    . heparin       LOS: 1 day    Time spent: 35 minutes.     Elmarie Shiley, MD [facility] Pager [phone]  If 7PM-7AM, please  contact night-coverage [email or web address] Password [email or web address] 09/30/2017, 2:24 PM

## 2017-09-30 NOTE — Progress Notes (Signed)
Progress Note  Patient Name: Randall Callahan Date of Encounter: 09/30/2017  Primary Cardiologist: Dr Crenshaw/Dr Curt Bears  Subjective   No chest pain or dyspnea  Inpatient Medications    Scheduled Meds: . aspirin EC  81 mg Oral Q breakfast  . atorvastatin  40 mg Oral QHS  . hydrocortisone cream   Topical BID  . insulin aspart  0-5 Units Subcutaneous QHS  . insulin aspart  0-9 Units Subcutaneous TID WC  . isosorbide mononitrate  120 mg Oral Daily  . lisinopril  2.5 mg Oral Q breakfast  . metoprolol succinate  75 mg Oral QPC breakfast  . sodium chloride flush  3 mL Intravenous Q12H  . sodium chloride flush  3 mL Intravenous Q12H   Continuous Infusions: . sodium chloride    . sodium chloride    . heparin     PRN Meds: sodium chloride, sodium chloride, acetaminophen **OR** acetaminophen, nitroGLYCERIN, sodium chloride flush, sodium chloride flush   Vital Signs    Vitals:   09/30/17 0945 09/30/17 1000 09/30/17 1015 09/30/17 1030  BP: 120/68 (!) 115/51 (!) 111/57 127/61  Pulse: 72 63 (!) 59 79  Resp:      Temp:      TempSrc:      SpO2: 96% 95% 96% 97%  Weight:      Height:        Intake/Output Summary (Last 24 hours) at 09/30/2017 1054 Last data filed at 09/30/2017 0900 Gross per 24 hour  Intake 1518.44 ml  Output 1400 ml  Net 118.44 ml   Filed Weights   09/29/17 0105 09/29/17 1829 09/30/17 0446  Weight: 208 lb (94.3 kg) 204 lb 4.8 oz (92.7 kg) 204 lb 14.4 oz (92.9 kg)    Telemetry    Vpaced - Personally Reviewed   Physical Exam   GEN: No acute distress.   Neck: No JVD Cardiac: RRR, no murmurs, rubs, or gallops.  Respiratory: Clear to auscultation bilaterally. GI: Soft, nontender, non-distended  MS: No edema; radial cath site with no hematoma Neuro:  Nonfocal  Psych: Normal affect   Labs    Chemistry Recent Labs  Lab 09/29/17 0109 09/30/17 0503  NA 133* 136  K 5.2* 3.9  CL 101 100*  CO2 18* 24  GLUCOSE 202* 150*  BUN 17 20    CREATININE 1.29* 1.16  CALCIUM 9.3 9.1  PROT  --  6.0*  ALBUMIN  --  3.2*  AST  --  40  ALT  --  14*  ALKPHOS  --  81  BILITOT  --  1.8*  GFRNONAA 48* 55*  GFRAA 56* >60  ANIONGAP 14 12     Hematology Recent Labs  Lab 09/29/17 0109 09/30/17 0503  WBC 11.1* 7.0  RBC 5.00 4.40  HGB 14.7 12.6*  HCT 43.6 38.0*  MCV 87.2 86.4  MCH 29.4 28.6  MCHC 33.7 33.2  RDW 14.8 14.7  PLT 234 227    Cardiac Enzymes Recent Labs  Lab 09/29/17 0514 09/29/17 1223 09/29/17 1840  TROPONINI 0.67* 3.83* 4.95*    Recent Labs  Lab 09/29/17 0127  TROPIPOC 0.04     BNP Recent Labs  Lab 09/29/17 0109  BNP 205.7*      Radiology    Dg Chest 2 View  Result Date: 09/29/2017 CLINICAL DATA:  Initial evaluation for acute chest pain. Job discomfort, bilateral upper arm pain. EXAM: CHEST  2 VIEW COMPARISON:  Prior radiograph from 02/17/2017. FINDINGS: The cardiac and mediastinal silhouettes are stable  in size and contour, and remain within normal limits. Left-sided pacemaker/AICD in place. Aortic atherosclerosis. Lungs are mildly hyperinflated. Underlying chronic lung changes noted, grossly similar to previous. Superimposed diffuse vascular and interstitial prominence, consistent with pulmonary interstitial edema. Evaluation for pleural effusion limited as the costophrenic angles are incompletely visualized. No consolidative airspace disease. No pneumothorax. No acute osseus abnormality.  Osteopenia. IMPRESSION: 1. Chronic lung changes with superimposed diffuse interstitial prominence, compatible with mild pulmonary interstitial edema. 2. Aortic atherosclerosis. Electronically Signed   By: Jeannine Boga M.D.   On: 09/29/2017 02:43     Patient Profile     83 y.o. male with past medical history of severe coronary disease, ischemic cardiomyopathy, prior pacemaker, diabetes mellitus, hypertension admitted with non-ST elevation myocardial infarction.  Assessment & Plan    1 coronary  artery disease-cath results noted.  Patient has severe coronary disease that would best be served by coronary artery bypass and graft.  However this may be too high risk given age and reduced LV function.  Will ask cardiothoracic surgery to see.  If CABG not an option then may need to consider high risk PCI.  Continue aspirin, statin and beta-blocker.  Resume heparin following catheterization.  Plavix discontinued until surgical opinion completed.  2 ischemic cardiomyopathy-continue beta-blocker and ARB.  3 prior pacemaker  4 chronic stage III kidney disease-we will repeat potassium and renal function tomorrow morning.  For questions or updates, please contact [place] Please consult [email or web address] for contact info under Cardiology/STEMI.      Signed, Kirk Ruths, MD  09/30/2017, 10:54 AM

## 2017-09-30 NOTE — Progress Notes (Signed)
ANTICOAGULATION CONSULT NOTE  Pharmacy Consult for heparin Indication: chest pain/ACS  No Known Allergies  Patient Measurements: Height: 6' (182.9 cm) Weight: 204 lb 14.4 oz (92.9 kg)(scale b) IBW/kg (Calculated) : 77.6 Heparin Dosing Weight: 94.3kg  Vital Signs: Temp: 98.8 F (37.1 C) (12/05 0446) Temp Source: Oral (12/05 0446) BP: 116/58 (12/05 0930) Pulse Rate: 81 (12/05 0930)  Labs: Recent Labs    09/29/17 0109 09/29/17 0514 09/29/17 1223 09/29/17 1840 09/30/17 0503  HGB 14.7  --   --   --  12.6*  HCT 43.6  --   --   --  38.0*  PLT 234  --   --   --  227  LABPROT  --   --   --   --  14.4  INR  --   --   --   --  1.13  HEPARINUNFRC  --   --   --  0.47 0.24*  CREATININE 1.29*  --   --   --  1.16  TROPONINI  --  0.67* 3.83* 4.95*  --     Estimated Creatinine Clearance: 49.2 mL/min (by C-G formula based on SCr of 1.16 mg/dL).    Assessment: 70 yom presented to the ED with CP now s/p cath with significant CAD, CVTS consult planned, planning to resume heparin at 1700 pm  Goal of Therapy:  Heparin level 0.3-0.7 units/ml Monitor platelets by anticoagulation protocol: Yes   Plan: Heparin at 1300 units / hr starting at 1700 pm Daily heparin level and CBC  Thank you Anette Guarneri, PharmD [phone] 09/30/2017 9:51 AM

## 2017-09-30 NOTE — Interval H&P Note (Signed)
History and Physical Interval Note:  09/30/2017 8:47 AM  Randall Callahan  has presented today for surgery, with the diagnosis of cp  The various methods of treatment have been discussed with the patient and family. After consideration of risks, benefits and other options for treatment, the patient has consented to  Procedure(s): LEFT HEART CATH AND CORONARY ANGIOGRAPHY (N/A) as a surgical intervention .  The patient's history has been reviewed, patient examined, no change in status, stable for surgery.  I have reviewed the patient's chart and labs.  Questions were answered to the patient's satisfaction.     Kathlyn [place]

## 2017-09-30 NOTE — Consult Note (Signed)
Reason for Consult: severe 2 vessel CAD, s/p MI Referring Physician: Dr. Fletcher Anon CARDS: Dr. Tarri Abernethy Randall Callahan is an 83 y.o. male.  HPI: 83 yo man with an extensive cardiac history that presents with a cc/o of a rapid HR with jaw pain radiating to his arms  Randall Callahan is an 83 yo man with a long standing history of CAD. He had stents placed in the LAD and circumflex in 2003. He has known ischemic cardiomyopathy. He says that within a couple of years one of the stents was occluded. Was definitely occluded by 2008 as documented by cath. He has been splitting time between [place] and [place]. He had a non STEMI in 08/2016 treated medically. Cath at that time showed a totally occluded LAD, 99% stenosis of his circumflex and moderate disease in the ramus branch. He was treated medically. Did well until recently when began having more frequent and longer lasting episodes of pain with exertion.  Also has history of CKD, angiomyolipoma of kidney, Schatzki's ring, SSS with a DDD pacer, hypertension, hyperlipidemia, GERD, chronic back pain, and peripheral neuropathy.  Has been pain free since admission.  Past Medical History:  Diagnosis Date  . Angina pectoris, unstable ([place])   . Angiomyolipoma of kidney   . Arthritis    "hips and knees" (09/29/2017)  . ASCVD (arteriosclerotic cardiovascular disease)   . Bradycardia   . Cancer [facility]) 2002   "iliac crest"  . Cancer of skin, face    "several" (09/29/2017)  . Cardiomyopathy ([place])   . Chest pain   . Chest tightness   . Chronic lower back pain   . CKD (chronic kidney disease), stage III ([place])    Randall Callahan 09/29/2017  . Complication of anesthesia    "my urinary tract becomes irritated; takes ~ 3-4 weeks to go away" (09/29/2017)  . Coronary artery disease   . Diverticulosis   . GERD (gastroesophageal reflux disease)   . High cholesterol   . Hx of sick sinus syndrome   . Hypertension   . MCI (mild cognitive impairment)   . MI (myocardial  infarction) ([facility]) 2003; ~ 12/2016;   . NSTEMI (non-ST elevation myocardial infarction) ([place]) 08/2016   Randall Callahan 09/29/2017  . Peripheral neuropathy   . Pneumonia 2000s X ?2  . Presence of permanent cardiac pacemaker 2015  . Schatzki's ring   . Squamous cell carcinoma in situ of skin   . Type II diabetes mellitus ([place])     Past Surgical History:  Procedure Laterality Date  . CARDIAC CATHETERIZATION  >2003 X 2   "couldn't unblock the stents that were blocked"  . CATARACT EXTRACTION W/ INTRAOCULAR LENS IMPLANT Right 2000s  . COLONOSCOPY W/ BIOPSIES AND POLYPECTOMY    . CORONARY ANGIOGRAPHY    . CORONARY ANGIOPLASTY WITH STENT PLACEMENT  2003   "2 stents"  . ESOPHAGOGASTRODUODENOSCOPY    . EXCISIONAL HEMORRHOIDECTOMY    . INSERT / REPLACE / REMOVE PACEMAKER  2015   Randall Callahan 09/29/2017  . JOINT REPLACEMENT    . LEFT HEART CATH AND CORONARY ANGIOGRAPHY N/A 09/30/2017   Procedure: LEFT HEART CATH AND CORONARY ANGIOGRAPHY;  Surgeon: Wellington Hampshire, MD;  Location: [facility];  Service: Cardiovascular;  Laterality: N/A;  . MASS EXCISION Right 2002   "iliac crest cancer"  . SKIN CANCER EXCISION     "facial"  . TONSILLECTOMY    . TOTAL KNEE ARTHROPLASTY Left ~ 2012  . VASCULAR SURGERY      Family History  Problem Relation Age of Onset  . Angina Mother   . Stroke Mother   . Other Father        Car accident  . Prostate cancer Brother   . Cancer Brother     Social History:  reports that he has quit smoking. His smoking use included cigarettes. He quit after 2.00 years of use. he has never used smokeless tobacco. He reports that he drinks about 0.6 oz of alcohol per week. He reports that he does not use drugs.  Allergies: No Known Allergies  Medications:  Prior to Admission:  Medications Prior to Admission  Medication Sig Dispense Refill Last Dose  . aspirin EC 81 MG tablet Take 81 mg by mouth daily with breakfast.    09/28/2017 at Unknown time  . atorvastatin (LIPITOR) 40 MG  tablet Take 40 mg by mouth at bedtime.    09/28/2017 at Unknown time  . clopidogrel (PLAVIX) 75 MG tablet Take 75 mg by mouth daily with breakfast.    09/28/2017 at Unknown time  . isosorbide mononitrate (IMDUR) 60 MG 24 hr tablet Take 1 tablet (60 mg total) by mouth daily. (Patient taking differently: Take 120 mg by mouth daily. ) 90 tablet 2 09/28/2017 at Unknown time  . lisinopril (PRINIVIL,ZESTRIL) 2.5 MG tablet Take 2.5 mg by mouth daily with breakfast.    09/28/2017 at Unknown time  . metFORMIN (GLUCOPHAGE) 1000 MG tablet Take 1,000 mg by mouth 2 (two) times daily with a meal.   09/28/2017 at Unknown time  . metoprolol succinate (TOPROL-XL) 50 MG 24 hr tablet Take 75 mg by mouth daily after breakfast. Take with or immediately following a meal.   09/28/2017 at 0800  . nitroGLYCERIN (NITROSTAT) 0.4 MG SL tablet Place 0.4 mg under the tongue every 5 (five) minutes as needed for chest pain. Up to 3 doses for chest pain. If pain has not subsided after 3 doses go to the ER.   09/28/2017 at Unknown time  . Omega-3 Fatty Acids (FISH OIL PO) Take 1,600 mg by mouth daily.   09/28/2017 at Unknown time    Results for orders placed or performed during the hospital encounter of 09/29/17 (from the past 48 hour(s))  Basic metabolic panel     Status: Abnormal   Collection Time: 09/29/17  1:09 AM  Result Value Ref Range   Sodium 133 (L) 135 - 145 mmol/L   Potassium 5.2 (H) 3.5 - 5.1 mmol/L    Comment: HEMOLYSIS AT THIS LEVEL MAY AFFECT RESULT   Chloride 101 101 - 111 mmol/L   CO2 18 (L) 22 - 32 mmol/L   Glucose, Bld 202 (H) 65 - 99 mg/dL   BUN 17 6 - 20 mg/dL   Creatinine, Ser 1.29 (H) 0.61 - 1.24 mg/dL   Calcium 9.3 8.9 - 10.3 mg/dL   GFR calc non Af Amer 48 (L) >60 mL/min   GFR calc Af Amer 56 (L) >60 mL/min    Comment: (NOTE) The eGFR has been calculated using the CKD EPI equation. This calculation has not been validated in all clinical situations. eGFR's persistently <60 mL/min signify possible Chronic  Kidney Disease.    Anion gap 14 5 - 15  CBC     Status: Abnormal   Collection Time: 09/29/17  1:09 AM  Result Value Ref Range   WBC 11.1 (H) 4.0 - 10.5 K/uL   RBC 5.00 4.22 - 5.81 MIL/uL   Hemoglobin 14.7 13.0 - 17.0 g/dL   HCT 43.6 39.0 -  52.0 %   MCV 87.2 78.0 - 100.0 fL   MCH 29.4 26.0 - 34.0 pg   MCHC 33.7 30.0 - 36.0 g/dL   RDW 14.8 11.5 - 15.5 %   Platelets 234 150 - 400 K/uL  Brain natriuretic peptide     Status: Abnormal   Collection Time: 09/29/17  1:09 AM  Result Value Ref Range   B Natriuretic Peptide 205.7 (H) 0.0 - 100.0 pg/mL  I-stat troponin, ED     Status: None   Collection Time: 09/29/17  1:27 AM  Result Value Ref Range   Troponin i, poc 0.04 0.00 - 0.08 ng/mL   Comment 3            Comment: Due to the release kinetics of cTnI, a negative result within the first hours of the onset of symptoms does not rule out myocardial infarction with certainty. If myocardial infarction is still suspected, repeat the test at appropriate intervals.   Troponin I     Status: Abnormal   Collection Time: 09/29/17  5:14 AM  Result Value Ref Range   Troponin I 0.67 (HH) <0.03 ng/mL    Comment: CRITICAL RESULT CALLED TO, READ BACK BY AND VERIFIED WITH: P.OBAMA,RN [id number] 09/29/17 CLARK,S   TSH     Status: None   Collection Time: 09/29/17  5:14 AM  Result Value Ref Range   TSH 1.137 0.350 - 4.500 uIU/mL    Comment: Performed by a 3rd Generation assay with a functional sensitivity of <=0.01 uIU/mL.  CBG monitoring, ED     Status: Abnormal   Collection Time: 09/29/17  9:09 AM  Result Value Ref Range   Glucose-Capillary 135 (H) 65 - 99 mg/dL   Comment 1 Notify RN    Comment 2 Document in Chart   CBG monitoring, ED     Status: Abnormal   Collection Time: 09/29/17 12:03 PM  Result Value Ref Range   Glucose-Capillary 152 (H) 65 - 99 mg/dL  Troponin I     Status: Abnormal   Collection Time: 09/29/17 12:23 PM  Result Value Ref Range   Troponin I 3.83 (HH) <0.03 ng/mL     Comment: CRITICAL VALUE NOTED.  VALUE IS CONSISTENT WITH PREVIOUSLY REPORTED AND CALLED VALUE.  Troponin I     Status: Abnormal   Collection Time: 09/29/17  6:40 PM  Result Value Ref Range   Troponin I 4.95 (HH) <0.03 ng/mL    Comment: CRITICAL VALUE NOTED.  VALUE IS CONSISTENT WITH PREVIOUSLY REPORTED AND CALLED VALUE.  Heparin level (unfractionated)     Status: None   Collection Time: 09/29/17  6:40 PM  Result Value Ref Range   Heparin Unfractionated 0.47 0.30 - 0.70 IU/mL    Comment:        IF HEPARIN RESULTS ARE BELOW EXPECTED VALUES, AND PATIENT DOSAGE HAS BEEN CONFIRMED, SUGGEST FOLLOW UP TESTING OF ANTITHROMBIN III LEVELS.   Glucose, capillary     Status: Abnormal   Collection Time: 09/29/17  9:07 PM  Result Value Ref Range   Glucose-Capillary 191 (H) 65 - 99 mg/dL   Comment 1 Notify RN    Comment 2 Document in Chart   Heparin level (unfractionated)     Status: Abnormal   Collection Time: 09/30/17  5:03 AM  Result Value Ref Range   Heparin Unfractionated 0.24 (L) 0.30 - 0.70 IU/mL    Comment:        IF HEPARIN RESULTS ARE BELOW EXPECTED VALUES, AND PATIENT DOSAGE HAS BEEN CONFIRMED,  SUGGEST FOLLOW UP TESTING OF ANTITHROMBIN III LEVELS.   CBC     Status: Abnormal   Collection Time: 09/30/17  5:03 AM  Result Value Ref Range   WBC 7.0 4.0 - 10.5 K/uL   RBC 4.40 4.22 - 5.81 MIL/uL   Hemoglobin 12.6 (L) 13.0 - 17.0 g/dL   HCT 38.0 (L) 39.0 - 52.0 %   MCV 86.4 78.0 - 100.0 fL   MCH 28.6 26.0 - 34.0 pg   MCHC 33.2 30.0 - 36.0 g/dL   RDW 14.7 11.5 - 15.5 %   Platelets 227 150 - 400 K/uL  Comprehensive metabolic panel     Status: Abnormal   Collection Time: 09/30/17  5:03 AM  Result Value Ref Range   Sodium 136 135 - 145 mmol/L   Potassium 3.9 3.5 - 5.1 mmol/L    Comment: DELTA CHECK NOTED   Chloride 100 (L) 101 - 111 mmol/L   CO2 24 22 - 32 mmol/L   Glucose, Bld 150 (H) 65 - 99 mg/dL   BUN 20 6 - 20 mg/dL   Creatinine, Ser 1.16 0.61 - 1.24 mg/dL   Calcium 9.1  8.9 - 10.3 mg/dL   Total Protein 6.0 (L) 6.5 - 8.1 g/dL   Albumin 3.2 (L) 3.5 - 5.0 g/dL   AST 40 15 - 41 U/L   ALT 14 (L) 17 - 63 U/L   Alkaline Phosphatase 81 38 - 126 U/L   Total Bilirubin 1.8 (H) 0.3 - 1.2 mg/dL   GFR calc non Af Amer 55 (L) >60 mL/min   GFR calc Af Amer >60 >60 mL/min    Comment: (NOTE) The eGFR has been calculated using the CKD EPI equation. This calculation has not been validated in all clinical situations. eGFR's persistently <60 mL/min signify possible Chronic Kidney Disease.    Anion gap 12 5 - 15  Protime-INR     Status: None   Collection Time: 09/30/17  5:03 AM  Result Value Ref Range   Prothrombin Time 14.4 11.4 - 15.2 seconds   INR 1.13   Glucose, capillary     Status: Abnormal   Collection Time: 09/30/17  7:30 AM  Result Value Ref Range   Glucose-Capillary 141 (H) 65 - 99 mg/dL   Comment 1 Notify RN    Comment 2 Document in Chart   Glucose, capillary     Status: Abnormal   Collection Time: 09/30/17 12:26 PM  Result Value Ref Range   Glucose-Capillary 138 (H) 65 - 99 mg/dL   Comment 1 Notify RN    Comment 2 Document in Chart     Dg Chest 2 View  Result Date: 09/29/2017 CLINICAL DATA:  Initial evaluation for acute chest pain. Job discomfort, bilateral upper arm pain. EXAM: CHEST  2 VIEW COMPARISON:  Prior radiograph from 02/17/2017. FINDINGS: The cardiac and mediastinal silhouettes are stable in size and contour, and remain within normal limits. Left-sided pacemaker/AICD in place. Aortic atherosclerosis. Lungs are mildly hyperinflated. Underlying chronic lung changes noted, grossly similar to previous. Superimposed diffuse vascular and interstitial prominence, consistent with pulmonary interstitial edema. Evaluation for pleural effusion limited as the costophrenic angles are incompletely visualized. No consolidative airspace disease. No pneumothorax. No acute osseus abnormality.  Osteopenia. IMPRESSION: 1. Chronic lung changes with superimposed  diffuse interstitial prominence, compatible with mild pulmonary interstitial edema. 2. Aortic atherosclerosis. Electronically Signed   By: Jeannine Boga M.D.   On: 09/29/2017 02:43    Review of Systems  Constitutional: Positive for malaise/fatigue.  Cardiovascular: Positive for chest pain (equivalent). Negative for orthopnea, claudication and leg swelling.  Genitourinary: Positive for frequency and urgency.  Musculoskeletal: Positive for back pain and joint pain.  Neurological: Positive for sensory change. Negative for speech change, focal weakness and loss of consciousness.  All other systems reviewed and are negative.  Blood pressure (!) 107/48, pulse 73, temperature 98.8 F (37.1 C), temperature source Oral, resp. rate 20, height 6' (1.829 m), weight 204 lb 14.4 oz (92.9 kg), SpO2 96 %. Physical Exam  Vitals reviewed. Constitutional: He is oriented to person, place, and time. He appears well-developed and well-nourished. No distress.  HENT:  Head: Normocephalic and atraumatic.  Mouth/Throat: No oropharyngeal exudate.  Eyes: Conjunctivae and EOM are normal. No scleral icterus.  Neck: No thyromegaly present.  Cardiovascular: Normal rate and regular rhythm. Exam reveals gallop (+S4).  Murmur (faint systolic) heard. Respiratory: Effort normal and breath sounds normal. No respiratory distress. He has no wheezes. He has no rales.  GI: Soft. He exhibits no distension. There is no tenderness.  Musculoskeletal: He exhibits no edema.  Lymphadenopathy:    He has no cervical adenopathy.  Neurological: He is alert and oriented to person, place, and time. No cranial nerve deficit.  Good motor strength all 4 ext  Skin: Skin is warm and dry.   CARDIAC CATHETERIZATION Conclusion     There is severe left ventricular systolic dysfunction.  LV end diastolic pressure is moderately elevated.  Prox LAD lesion is 100% stenosed.  Ost Ramus to Ramus lesion is 90% stenosed.  Ost Cx to  Prox Cx lesion is 100% stenosed.  Prox RCA lesion is 30% stenosed.   1.  Severe two-vessel coronary artery disease with chronically occluded proximal to mid LAD at the distal segment of the previously placed stent with faint left to left and right-to-left collaterals.  Occluded proximal left circumflex at the previously placed stent with right to left collaterals.  Severe diffuse proximal disease in a large ramus branch.  The RCA has only mild disease. The coronary arteries are heavily calcified. 2.  Severely reduced LV systolic function with an EF of 25-30% with severe anterior and apical hypokinesis. 3.  Moderately elevated left ventricular end-diastolic pressure at 22 mmHg.  Recommendations: Obtain an echocardiogram to better evaluate ejection fraction and presence of valvular abnormalities.  Left ventricular angiography was performed with a hand injection to minimize contrast. Revascularization options are difficult with PCI.  I recommend CVTS consult to see if the patient is a candidate for CABG. If he is deemed to be not a candidate for CABG, high risk PCI of the ramus and possibly the LAD can be considered.  I do not think the left circumflex is approachable percutaneously. Resume heparin 8 hours after sheath pull.   I personally reviewed the cath images and concur with the findings noted above  Assessment/Plan: Randall Callahan is an 83 yo man with multiple medical issues and a long standing cardiac history. He has known CAD with ischemic cardiomyopathy and sick sinus syndrome. Despite his low EF of 25% at cath and elevated LVEDP, he is reasonably well compensated. He presented with an unstable coronary syndrome and ruled in for MI. At catheterization he has severe 2 vessel disease with total occlusion of both the LAD and circumflex. The only significant difference from his cath in 11/17 is that he now has severe disease in the Ramus as well.  Given his advanced age, severe LV dysfunction  and co-morbidities, he would be an extremely  high risk patient for CABG. There is a good possibility he never returns to his previous state of function after CABG. I think he could achieve a reasonable quality of life with much less risk with stenting of the Ramus branch.   Discussed with Dr. Stanford Breed. He will review with his interventional colleagues.  I had a long discussion with Randall Callahan nad his family regarding these issues   Melrose Nakayama 09/30/2017, 3:34 PM

## 2017-10-01 ENCOUNTER — Inpatient Hospital Stay (HOSPITAL_COMMUNITY): Payer: Medicare Other

## 2017-10-01 DIAGNOSIS — I340 Nonrheumatic mitral (valve) insufficiency: Secondary | ICD-10-CM

## 2017-10-01 LAB — BASIC METABOLIC PANEL WITH GFR
Anion gap: 9 (ref 5–15)
BUN: 17 mg/dL (ref 6–20)
CO2: 24 mmol/L (ref 22–32)
Calcium: 9.1 mg/dL (ref 8.9–10.3)
Chloride: 103 mmol/L (ref 101–111)
Creatinine, Ser: 1.1 mg/dL (ref 0.61–1.24)
GFR calc Af Amer: >60 mL/min
GFR calc non Af Amer: 58 mL/min — ABNORMAL LOW
Glucose, Bld: 154 mg/dL — ABNORMAL HIGH (ref 65–99)
Potassium: 4 mmol/L (ref 3.5–5.1)
Sodium: 136 mmol/L (ref 135–145)

## 2017-10-01 LAB — GLUCOSE, CAPILLARY
GLUCOSE-CAPILLARY: 140 mg/dL — AB (ref 65–99)
GLUCOSE-CAPILLARY: 122 mg/dL — AB (ref 65–99)
Glucose-Capillary: 168 mg/dL — ABNORMAL HIGH (ref 65–99)
Glucose-Capillary: 147 mg/dL — ABNORMAL HIGH (ref 65–99)

## 2017-10-01 LAB — ECHOCARDIOGRAM COMPLETE
HEIGHTINCHES: 72 in
Weight: 3283.2 oz

## 2017-10-01 LAB — CBC
HCT: 37 % — ABNORMAL LOW (ref 39.0–52.0)
Hemoglobin: 12.3 g/dL — ABNORMAL LOW (ref 13.0–17.0)
MCH: 28.7 pg (ref 26.0–34.0)
MCHC: 33.2 g/dL (ref 30.0–36.0)
MCV: 86.2 fL (ref 78.0–100.0)
Platelets: 219 10*3/uL (ref 150–400)
RBC: 4.29 MIL/uL (ref 4.22–5.81)
RDW: 14.8 % (ref 11.5–15.5)
WBC: 7 10*3/uL (ref 4.0–10.5)

## 2017-10-01 LAB — HEPARIN LEVEL (UNFRACTIONATED): Heparin Unfractionated: 0.39 IU/mL (ref 0.30–0.70)

## 2017-10-01 MED ORDER — ASPIRIN 81 MG PO CHEW
81.0000 mg | CHEWABLE_TABLET | ORAL | Status: AC
  Administered 2017-10-02: 81 mg via ORAL
  Filled 2017-10-01: qty 1

## 2017-10-01 MED ORDER — PERFLUTREN LIPID MICROSPHERE
1.0000 mL | INTRAVENOUS | Status: AC | PRN
  Administered 2017-10-01: 4 mL via INTRAVENOUS
  Filled 2017-10-01: qty 10

## 2017-10-01 MED ORDER — CLOPIDOGREL BISULFATE 75 MG PO TABS
75.0000 mg | ORAL_TABLET | Freq: Every day | ORAL | Status: DC
  Filled 2017-10-01: qty 1

## 2017-10-01 MED ORDER — SODIUM CHLORIDE 0.9 % IV SOLN: 250.0000 mL | INTRAVENOUS | Status: DC | PRN

## 2017-10-01 MED ORDER — SODIUM CHLORIDE 0.9% FLUSH
3.0000 mL | Freq: Two times a day (BID) | INTRAVENOUS | Status: DC
  Administered 2017-10-01: 3 mL via INTRAVENOUS

## 2017-10-01 MED ORDER — CLOPIDOGREL BISULFATE 75 MG PO TABS
300.0000 mg | ORAL_TABLET | Freq: Once | ORAL | Status: AC
  Administered 2017-10-01: 300 mg via ORAL
  Filled 2017-10-01: qty 4

## 2017-10-01 MED ORDER — SODIUM CHLORIDE 0.9 % IV SOLN: INTRAVENOUS | Status: DC

## 2017-10-01 MED ORDER — SODIUM CHLORIDE 0.9% FLUSH: 3.0000 mL | INTRAVENOUS | Status: DC | PRN

## 2017-10-01 MED ORDER — PERFLUTREN LIPID MICROSPHERE
INTRAVENOUS | Status: AC
  Filled 2017-10-01: qty 10

## 2017-10-01 NOTE — Progress Notes (Signed)
ANTICOAGULATION CONSULT NOTE  Pharmacy Consult for heparin Indication: chest pain/ACS  No Known Allergies  Patient Measurements: Height: 6' (182.9 cm) Weight: 205 lb 3.2 oz (93.1 kg) IBW/kg (Calculated) : 77.6 Heparin Dosing Weight: 94.3kg  Vital Signs: Temp: 98.3 F (36.8 C) (12/06 0527) Temp Source: Oral (12/06 0527) BP: 106/58 (12/06 0527) Pulse Rate: 66 (12/06 0527)  Labs: Recent Labs    09/29/17 0109 09/29/17 0514 09/29/17 1223 09/29/17 1840 09/30/17 0503 10/01/17 0614  HGB 14.7  --   --   --  12.6* 12.3*  HCT 43.6  --   --   --  38.0* 37.0*  PLT 234  --   --   --  227 219  LABPROT  --   --   --   --  14.4  --   INR  --   --   --   --  1.13  --   HEPARINUNFRC  --   --   --  0.47 0.24* 0.39  CREATININE 1.29*  --   --   --  1.16 1.10  TROPONINI  --  0.67* 3.83* 4.95*  --   --     Estimated Creatinine Clearance: 51.9 mL/min (by C-G formula based on SCr of 1.1 mg/dL).    Assessment: 84 yom presented to the ED with CP now s/p cath with significant CAD Heparin level therapeutic   Goal of Therapy:  Heparin level 0.3-0.7 units/ml Monitor platelets by anticoagulation protocol: Yes   Plan: Heparin at 1300 units / hr  Daily heparin level and CBC  Thank you Anette Guarneri, PharmD [phone] 10/01/2017 8:13 AM

## 2017-10-01 NOTE — Progress Notes (Signed)
Progress Note  Patient Name: Nohlan Burdin Date of Encounter: 10/01/2017  Primary Cardiologist: Dr Crenshaw/Dr Curt Bears  Subjective   Pt denies CP or dyspnea  Inpatient Medications    Scheduled Meds: . aspirin EC  81 mg Oral Q breakfast  . atorvastatin  40 mg Oral QHS  . hydrocortisone cream   Topical BID  . insulin aspart  0-5 Units Subcutaneous QHS  . insulin aspart  0-9 Units Subcutaneous TID WC  . isosorbide mononitrate  120 mg Oral Daily  . lisinopril  2.5 mg Oral Q breakfast  . metoprolol succinate  75 mg Oral QPC breakfast  . sodium chloride flush  3 mL Intravenous Q12H  . sodium chloride flush  3 mL Intravenous Q12H   Continuous Infusions: . sodium chloride    . sodium chloride    . heparin 1,300 Units/hr (09/30/17 1722)   PRN Meds: sodium chloride, sodium chloride, acetaminophen **OR** acetaminophen, nitroGLYCERIN, sodium chloride flush, sodium chloride flush   Vital Signs    Vitals:   10/01/17 0008 10/01/17 0244 10/01/17 0527 10/01/17 0818  BP: (!) 102/55  (!) 106/58 106/61  Pulse: 66  66 75  Resp: 20  18   Temp: 98.5 F (36.9 C)  98.3 F (36.8 C)   TempSrc: Oral  Oral   SpO2: 95%  95% 95%  Weight:  205 lb 3.2 oz (93.1 kg)    Height:        Intake/Output Summary (Last 24 hours) at 10/01/2017 1107 Last data filed at 10/01/2017 0841 Gross per 24 hour  Intake 669 ml  Output 1800 ml  Net -1131 ml   Filed Weights   09/29/17 1829 09/30/17 0446 10/01/17 0244  Weight: 204 lb 4.8 oz (92.7 kg) 204 lb 14.4 oz (92.9 kg) 205 lb 3.2 oz (93.1 kg)    Telemetry    Vpaced- Personally Reviewed   Physical Exam   GEN: WD/WN No acute distress.   Neck: supple Cardiac: RRR Respiratory: CTA GI: Soft, nontender, non-distended, no masses MS: No edema Neuro:  Grossly intact   Labs    Chemistry Recent Labs  Lab 09/29/17 0109 09/30/17 0503 10/01/17 0614  NA 133* 136 136  K 5.2* 3.9 4.0  CL 101 100* 103  CO2 18* 24 24  GLUCOSE 202* 150* 154*    BUN 17 20 17   CREATININE 1.29* 1.16 1.10  CALCIUM 9.3 9.1 9.1  PROT  --  6.0*  --   ALBUMIN  --  3.2*  --   AST  --  40  --   ALT  --  14*  --   ALKPHOS  --  81  --   BILITOT  --  1.8*  --   GFRNONAA 48* 55* 58*  GFRAA 56* >60 >60  ANIONGAP 14 12 9      Hematology Recent Labs  Lab 09/29/17 0109 09/30/17 0503 10/01/17 0614  WBC 11.1* 7.0 7.0  RBC 5.00 4.40 4.29  HGB 14.7 12.6* 12.3*  HCT 43.6 38.0* 37.0*  MCV 87.2 86.4 86.2  MCH 29.4 28.6 28.7  MCHC 33.7 33.2 33.2  RDW 14.8 14.7 14.8  PLT 234 227 219    Cardiac Enzymes Recent Labs  Lab 09/29/17 0514 09/29/17 1223 09/29/17 1840  TROPONINI 0.67* 3.83* 4.95*    Recent Labs  Lab 09/29/17 0127  TROPIPOC 0.04     BNP Recent Labs  Lab 09/29/17 0109  BNP 205.7*       Patient Profile     83  y.o. male with past medical history of severe coronary disease, ischemic cardiomyopathy, prior pacemaker, diabetes mellitus, hypertension admitted with non-ST elevation myocardial infarction.  Assessment & Plan    1 coronary artery disease/NSTEMI-cath results noted.  Patient has severe coronary disease that would best be served by coronary artery bypass and graft.  However he is felt to be too high risk given age and reduced LV function.  He has had progressive angina over the past year not controlled with medications.  Will arrange PCI of ramus intermedius tomorrow.  The risk and benefits including myocardial infarction, CVA and death discussed and he agrees to proceed.  Continue aspirin, heparin, statin and beta-blocker.  Resume Plavix.    2 ischemic cardiomyopathy-continue toprol and lisinopril.   3 prior pacemaker  4 chronic stage III kidney disease-we will repeat potassium and renal function tomorrow morning prior to PCI.  For questions or updates, please contact [place] Please consult [email or web address] for contact info under Cardiology/STEMI.      Signed, Kirk Ruths, MD  10/01/2017, 11:07 AM

## 2017-10-01 NOTE — Progress Notes (Signed)
  Echocardiogram 2D Echocardiogram has been performed.  Bobbye Charleston 10/01/2017, 3:35 PM

## 2017-10-01 NOTE — Progress Notes (Signed)
PROGRESS NOTE    Randall Callahan  EXB:[id number] DOB: February 19, 1928 DOA: 09/29/2017 PCP: Dineen Kid, MD    Brief Narrative:  Randall Callahan  is a 83 y.o. male, w  CAD, CM (EF 44%), Dm2 Hypertension, apparently c/o jaw pain and numbness in bilateral arms.    Assessment & Plan:   Principal Problem:   CHF (congestive heart failure) (HCC) Active Problems:   Type 2 diabetes mellitus without complications (HCC)   Hyperkalemia   Hyponatremia   Non-ST elevation (NSTEMI) myocardial infarction ([place])   1-N-STEMI;  Presents with jaw pain, positive troponin up to 4.  Had cath; with 2 vessels diseases. CABG vs hight risk PCI.  Cardiology recommending CVTS consult. CVTS consulted. Too high risk for surgery.  Plan for PCI tomorrow.  On IV heparin. On Aspirin, lipitor, Imdur.  Plan to repeat ECHO.  Chest pain free  2-History of ischemic cardiomyopathy On lisinopril, metoprolol.   3-History of sick sinus syndrome status post pacemaker placement Per cardiology.  4-Diabetes mellitus type 2 Follow with CBGs.  SSI.    DVT prophylaxis: Heparin.  Code Status: full code.  Family Communication: son and daughter in law at bedside.  Disposition Plan: to be determine.    Consultants:   Cardiology  CVTS   Procedures:   Cath   Antimicrobials: none   Subjective: He denies chest pain. Feeling ok   Objective: Vitals:   10/01/17 0008 10/01/17 0244 10/01/17 0527 10/01/17 0818  BP: (!) 102/55  (!) 106/58 106/61  Pulse: 66  66 75  Resp: 20  18   Temp: 98.5 F (36.9 C)  98.3 F (36.8 C)   TempSrc: Oral  Oral   SpO2: 95%  95% 95%  Weight:  93.1 kg (205 lb 3.2 oz)    Height:        Intake/Output Summary (Last 24 hours) at 10/01/2017 1427 Last data filed at 10/01/2017 1351 Gross per 24 hour  Intake 672 ml  Output 2000 ml  Net -1328 ml   Filed Weights   09/29/17 1829 09/30/17 0446 10/01/17 0244  Weight: 92.7 kg (204 lb 4.8 oz) 92.9 kg (204 lb 14.4 oz) 93.1 kg (205 lb 3.2  oz)    Examination:  General exam: NAD  Respiratory system: CTA Cardiovascular system: S 1, S 2 RRR, systolic Murmur Gastrointestinal system: BS present, soft, nt Central nervous system: alert non focal.  Extremities: symmetric power Skin: No rashes, lesions or ulcers   Data Reviewed: I have personally reviewed following labs and imaging studies  CBC: Recent Labs  Lab 09/29/17 0109 09/30/17 0503 10/01/17 0614  WBC 11.1* 7.0 7.0  HGB 14.7 12.6* 12.3*  HCT 43.6 38.0* 37.0*  MCV 87.2 86.4 86.2  PLT 234 227 102   Basic Metabolic Panel: Recent Labs  Lab 09/29/17 0109 09/30/17 0503 10/01/17 0614  NA 133* 136 136  K 5.2* 3.9 4.0  CL 101 100* 103  CO2 18* 24 24  GLUCOSE 202* 150* 154*  BUN 17 20 17   CREATININE 1.29* 1.16 1.10  CALCIUM 9.3 9.1 9.1   GFR: Estimated Creatinine Clearance: 51.9 mL/min (by C-G formula based on SCr of 1.1 mg/dL). Liver Function Tests: Recent Labs  Lab 09/30/17 0503  AST 40  ALT 14*  ALKPHOS 81  BILITOT 1.8*  PROT 6.0*  ALBUMIN 3.2*   No results for input(s): LIPASE, AMYLASE in the last 168 hours. No results for input(s): AMMONIA in the last 168 hours. Coagulation Profile: Recent Labs  Lab 09/30/17 0503  INR 1.13   Cardiac Enzymes: Recent Labs  Lab 09/29/17 0514 09/29/17 1223 09/29/17 1840  TROPONINI 0.67* 3.83* 4.95*   BNP (last 3 results) No results for input(s): PROBNP in the last 8760 hours. HbA1C: No results for input(s): HGBA1C in the last 72 hours. CBG: Recent Labs  Lab 09/30/17 1226 09/30/17 1611 09/30/17 2121 10/01/17 0716 10/01/17 1118  GLUCAP 138* 128* 124* 140* 168*   Lipid Profile: No results for input(s): CHOL, HDL, LDLCALC, TRIG, CHOLHDL, LDLDIRECT in the last 72 hours. Thyroid Function Tests: Recent Labs    09/29/17 0514  TSH 1.137   Anemia Panel: No results for input(s): VITAMINB12, FOLATE, FERRITIN, TIBC, IRON, RETICCTPCT in the last 72 hours. Sepsis Labs: No results for input(s):  PROCALCITON, LATICACIDVEN in the last 168 hours.  No results found for this or any previous visit (from the past 240 hour(s)).       Radiology Studies: No results found.      Scheduled Meds: . aspirin EC  81 mg Oral Q breakfast  . atorvastatin  40 mg Oral QHS  . hydrocortisone cream   Topical BID  . insulin aspart  0-5 Units Subcutaneous QHS  . insulin aspart  0-9 Units Subcutaneous TID WC  . isosorbide mononitrate  120 mg Oral Daily  . lisinopril  2.5 mg Oral Q breakfast  . metoprolol succinate  75 mg Oral QPC breakfast  . sodium chloride flush  3 mL Intravenous Q12H  . sodium chloride flush  3 mL Intravenous Q12H   Continuous Infusions: . sodium chloride    . sodium chloride    . heparin 1,300 Units/hr (09/30/17 1722)     LOS: 2 days    Time spent: 35 minutes.     Elmarie Shiley, MD [facility] Pager [phone]  If 7PM-7AM, please contact night-coverage [email or web address] Password [email or web address] 10/01/2017, 2:27 PM

## 2017-10-01 NOTE — Progress Notes (Signed)
Interventional Cardiology Note  Date: 10/01/17 Time: 8:39 PM  Mr. Randall Callahan is an 83 y/o man with h/o CAD s/p remote PCI's to LAD and LCx, ICM, CKD stage 3, and SSS s/p PPM, admitted with progressive chest pain now present with minimal activity such as walking to the bathroom. He notes a progressive decline over the last year following NSTEMI in [place] that was medically managed. LHC yesterday showed severe 2-vessel CAD with occlusions of the proximal LAD and LCx. Ramus has high-grade proximal disease. RCA has mild disease and collateralized the LAD and LCx. Echo today showed LVEF 30-35%. Mr. Randall Callahan was evaluated by TCTS and was felt to be extremely high-risk for CABG. We have therefore been asked to evaluate percutaneous coronary intervention options.  I have personally reviewed Mr. Randall Callahan catheterization. I do not believe that there are viable options for the LAD and LCx, which have reportedly been occluded for years. I favor treatment of the ramus alone. There is proximal calcification of the vessel, though I am concerned about the risk of atherectomy given the relatively small vessel size and patient's severely reduced LVEF. I have discussed the procedure and its risks with Mr. Randall Callahan, including the high-risk nature of this intervention. He states that his quality of life is very poor due to angina, and he wishes to proceed with PCI know the high-risk nature of the procedure.  I will restart clopidogrel wit a 300 mg load tonight, followed by 75 mg daily thereafter. Mr. Randall Callahan will be NPO after midnight for potential PCI to the ramus tomorrow if his renal function allows.  Nelva Bush, MD [facility] Pager: [phone]

## 2017-10-02 ENCOUNTER — Inpatient Hospital Stay (HOSPITAL_COMMUNITY): Payer: Medicare Other

## 2017-10-02 ENCOUNTER — Encounter (HOSPITAL_COMMUNITY)
Admission: EM | Disposition: A | Discharge status: Home / Self Care | Payer: Self-pay | Source: Home / Self Care | Attending: Internal Medicine

## 2017-10-02 DIAGNOSIS — R0600 Dyspnea, unspecified: Secondary | ICD-10-CM

## 2017-10-02 DIAGNOSIS — I255 Ischemic cardiomyopathy: Secondary | ICD-10-CM

## 2017-10-02 DIAGNOSIS — I2510 Atherosclerotic heart disease of native coronary artery without angina pectoris: Secondary | ICD-10-CM

## 2017-10-02 HISTORY — PX: CORONARY STENT INTERVENTION: CATH118234

## 2017-10-02 HISTORY — PX: LEFT HEART CATH: CATH118248

## 2017-10-02 LAB — POCT ACTIVATED CLOTTING TIME
ACTIVATED CLOTTING TIME: 268 s
Activated Clotting Time: 318 seconds

## 2017-10-02 LAB — GLUCOSE, CAPILLARY
GLUCOSE-CAPILLARY: 198 mg/dL — AB (ref 65–99)
Glucose-Capillary: 158 mg/dL — ABNORMAL HIGH (ref 65–99)
Glucose-Capillary: 193 mg/dL — ABNORMAL HIGH (ref 65–99)

## 2017-10-02 LAB — BASIC METABOLIC PANEL
ANION GAP: 8 (ref 5–15)
BUN: 14 mg/dL (ref 6–20)
CALCIUM: 9.1 mg/dL (ref 8.9–10.3)
CO2: 23 mmol/L (ref 22–32)
CREATININE: 0.99 mg/dL (ref 0.61–1.24)
Chloride: 106 mmol/L (ref 101–111)
GFR calc Af Amer: >60 mL/min (ref >60)
Glucose, Bld: 167 mg/dL — ABNORMAL HIGH (ref 65–99)
POTASSIUM: 3.9 mmol/L (ref 3.5–5.1)
Sodium: 137 mmol/L (ref 135–145)

## 2017-10-02 LAB — CBC
HCT: 39.5 % (ref 39.0–52.0)
HEMOGLOBIN: 13.5 g/dL (ref 13.0–17.0)
MCH: 29.3 pg (ref 26.0–34.0)
MCHC: 34.2 g/dL (ref 30.0–36.0)
MCV: 85.9 fL (ref 78.0–100.0)
PLATELETS: 239 10*3/uL (ref 150–400)
RBC: 4.6 MIL/uL (ref 4.22–5.81)
RDW: 14.6 % (ref 11.5–15.5)
WBC: 9.6 10*3/uL (ref 4.0–10.5)

## 2017-10-02 LAB — HEPARIN LEVEL (UNFRACTIONATED): Heparin Unfractionated: 0.37 IU/mL (ref 0.30–0.70)

## 2017-10-02 SURGERY — CORONARY STENT INTERVENTION
Anesthesia: LOCAL

## 2017-10-02 MED ORDER — IOPAMIDOL (ISOVUE-370) INJECTION 76%
INTRAVENOUS | Status: AC
  Filled 2017-10-02: qty 125

## 2017-10-02 MED ORDER — ASPIRIN 81 MG PO CHEW
81.0000 mg | CHEWABLE_TABLET | Freq: Every day | ORAL | Status: DC
  Administered 2017-10-03: 81 mg via ORAL
  Filled 2017-10-02: qty 1

## 2017-10-02 MED ORDER — SODIUM CHLORIDE 0.9 % IV SOLN: INTRAVENOUS | Status: AC

## 2017-10-02 MED ORDER — VERAPAMIL HCL 2.5 MG/ML IV SOLN
INTRAVENOUS | Status: DC | PRN
  Administered 2017-10-02: 10 mL via INTRA_ARTERIAL

## 2017-10-02 MED ORDER — MIDAZOLAM HCL 2 MG/2ML IJ SOLN
INTRAMUSCULAR | Status: DC | PRN
  Administered 2017-10-02: 1 mg via INTRAVENOUS

## 2017-10-02 MED ORDER — HEPARIN (PORCINE) IN NACL 2-0.9 UNIT/ML-% IJ SOLN
INTRAMUSCULAR | Status: AC
  Filled 2017-10-02: qty 1000

## 2017-10-02 MED ORDER — HEPARIN SODIUM (PORCINE) 1000 UNIT/ML IJ SOLN
INTRAMUSCULAR | Status: AC
  Filled 2017-10-02: qty 1

## 2017-10-02 MED ORDER — ACETAMINOPHEN 325 MG PO TABS: 650.0000 mg | ORAL_TABLET | ORAL | Status: DC | PRN

## 2017-10-02 MED ORDER — ANGIOPLASTY BOOK
Freq: Once | Status: AC
  Administered 2017-10-02: 21:00:00 1
  Filled 2017-10-02: qty 1

## 2017-10-02 MED ORDER — LIDOCAINE HCL (PF) 1 % IJ SOLN
INTRAMUSCULAR | Status: AC
  Filled 2017-10-02: qty 30

## 2017-10-02 MED ORDER — LIDOCAINE HCL (PF) 1 % IJ SOLN
INTRAMUSCULAR | Status: DC | PRN
  Administered 2017-10-02: 2 mL

## 2017-10-02 MED ORDER — VERAPAMIL HCL 2.5 MG/ML IV SOLN
INTRAVENOUS | Status: AC
  Filled 2017-10-02: qty 2

## 2017-10-02 MED ORDER — HEART ATTACK BOUNCING BOOK
Freq: Once | Status: AC
  Administered 2017-10-02: 21:00:00 1
  Filled 2017-10-02: qty 1

## 2017-10-02 MED ORDER — MIDAZOLAM HCL 2 MG/2ML IJ SOLN
INTRAMUSCULAR | Status: AC
  Filled 2017-10-02: qty 2

## 2017-10-02 MED ORDER — HEPARIN (PORCINE) IN NACL 2-0.9 UNIT/ML-% IJ SOLN
INTRAMUSCULAR | Status: DC | PRN
  Administered 2017-10-02: 1000 mL

## 2017-10-02 MED ORDER — HEPARIN SODIUM (PORCINE) 1000 UNIT/ML IJ SOLN
INTRAMUSCULAR | Status: DC | PRN
  Administered 2017-10-02: 9000 [IU] via INTRAVENOUS
  Administered 2017-10-02: 3000 [IU] via INTRAVENOUS

## 2017-10-02 MED ORDER — HYDRALAZINE HCL 20 MG/ML IJ SOLN: 5.0000 mg | INTRAMUSCULAR | Status: AC | PRN

## 2017-10-02 MED ORDER — FENTANYL CITRATE (PF) 100 MCG/2ML IJ SOLN
INTRAMUSCULAR | Status: DC | PRN
  Administered 2017-10-02: 25 ug via INTRAVENOUS

## 2017-10-02 MED ORDER — SODIUM CHLORIDE 0.9% FLUSH: 3.0000 mL | INTRAVENOUS | Status: DC | PRN

## 2017-10-02 MED ORDER — SODIUM CHLORIDE 0.9% FLUSH: 3.0000 mL | Freq: Two times a day (BID) | INTRAVENOUS | Status: DC

## 2017-10-02 MED ORDER — FENTANYL CITRATE (PF) 100 MCG/2ML IJ SOLN
INTRAMUSCULAR | Status: AC
  Filled 2017-10-02: qty 2

## 2017-10-02 MED ORDER — IOPAMIDOL (ISOVUE-370) INJECTION 76%
INTRAVENOUS | Status: DC | PRN
  Administered 2017-10-02: 90 mL via INTRAVENOUS

## 2017-10-02 MED ORDER — ONDANSETRON HCL 4 MG/2ML IJ SOLN: 4.0000 mg | Freq: Four times a day (QID) | INTRAMUSCULAR | Status: DC | PRN

## 2017-10-02 MED ORDER — CLOPIDOGREL BISULFATE 75 MG PO TABS
75.0000 mg | ORAL_TABLET | ORAL | Status: AC
  Administered 2017-10-02: 75 mg via ORAL
  Filled 2017-10-02: qty 1

## 2017-10-02 MED ORDER — NITROGLYCERIN 1 MG/10 ML FOR IR/CATH LAB
INTRA_ARTERIAL | Status: AC
  Filled 2017-10-02: qty 10

## 2017-10-02 MED ORDER — SODIUM CHLORIDE 0.9 % IV SOLN: 250.0000 mL | INTRAVENOUS | Status: DC | PRN

## 2017-10-02 MED ORDER — CLOPIDOGREL BISULFATE 75 MG PO TABS
75.0000 mg | ORAL_TABLET | Freq: Every day | ORAL | Status: DC
  Administered 2017-10-03: 09:00:00 75 mg via ORAL
  Filled 2017-10-02: qty 1

## 2017-10-02 MED ORDER — LABETALOL HCL 5 MG/ML IV SOLN: 10.0000 mg | INTRAVENOUS | Status: AC | PRN

## 2017-10-02 SURGICAL SUPPLY — 17 items
BALLN EMERGE MR 2.5X20 (BALLOONS) ×2
BALLN SAPPHIRE ~~LOC~~ 3.5X15 (BALLOONS) ×2 IMPLANT
BALLOON EMERGE MR 2.5X20 (BALLOONS) ×1 IMPLANT
CATH LAUNCHER 6FR EBU3.5 (CATHETERS) ×2 IMPLANT
DEVICE RAD COMP TR BAND LRG (VASCULAR PRODUCTS) ×4 IMPLANT
ELECT DEFIB PAD ADLT CADENCE (PAD) ×2 IMPLANT
GLIDESHEATH SLEND SS 6F .021 (SHEATH) ×2 IMPLANT
GUIDEWIRE INQWIRE 1.5J.035X260 (WIRE) ×1 IMPLANT
INQWIRE 1.5J .035X260CM (WIRE) ×2
KIT ENCORE 26 ADVANTAGE (KITS) ×2 IMPLANT
KIT HEART LEFT (KITS) ×2 IMPLANT
PACK CARDIAC CATHETERIZATION (CUSTOM PROCEDURE TRAY) ×2 IMPLANT
STENT SIERRA 3.00 X 38 MM (Permanent Stent) ×2 IMPLANT
STENT SIERRA 3.25 X 12 MM (Permanent Stent) ×2 IMPLANT
TRANSDUCER W/STOPCOCK (MISCELLANEOUS) ×2 IMPLANT
TUBING CIL FLEX 10 FLL-RA (TUBING) ×2 IMPLANT
WIRE ASAHI PROWATER 180CM (WIRE) ×2 IMPLANT

## 2017-10-02 NOTE — Progress Notes (Signed)
Patient complained of not feeling good. Asked what symptoms were he having and stated "I'm just not feeling like myself. I take a breathe and then I have to take another deep one afterwards". Vital signs checked. BP 122/65, P 87, T. 98.7, O2 93 RA. Placed patient on 2L for comfort. Will continue to monitor.

## 2017-10-02 NOTE — H&P (View-Only) (Signed)
Progress Note  Patient Name: Randall Callahan Date of Encounter: 10/02/2017  Primary Cardiologist: Dr Asheton Viramontes/Dr Curt Bears  Subjective   Pt mild dyspnea last PM; no chest pain  Inpatient Medications    Scheduled Meds: . aspirin EC  81 mg Oral Q breakfast  . atorvastatin  40 mg Oral QHS  . clopidogrel  75 mg Oral Daily  . hydrocortisone cream   Topical BID  . insulin aspart  0-5 Units Subcutaneous QHS  . insulin aspart  0-9 Units Subcutaneous TID WC  . isosorbide mononitrate  120 mg Oral Daily  . lisinopril  2.5 mg Oral Q breakfast  . metoprolol succinate  75 mg Oral QPC breakfast  . sodium chloride flush  3 mL Intravenous Q12H  . sodium chloride flush  3 mL Intravenous Q12H  . sodium chloride flush  3 mL Intravenous Q12H   Continuous Infusions: . sodium chloride    . sodium chloride    . sodium chloride    . sodium chloride    . heparin 1,300 Units/hr (09/30/17 1722)   PRN Meds: sodium chloride, sodium chloride, sodium chloride, acetaminophen **OR** acetaminophen, nitroGLYCERIN, sodium chloride flush, sodium chloride flush, sodium chloride flush   Vital Signs    Vitals:   10/01/17 0818 10/01/17 1914 10/02/17 0541 10/02/17 0700  BP: 106/61 (!) 108/52 111/64 103/60  Pulse: 75 80 79 78  Resp:  18 18 18   Temp:  98.9 F (37.2 C) 99.3 F (37.4 C) 97.8 F (36.6 C)  TempSrc:  Oral Oral Oral  SpO2: 95% 96% 97% 99%  Weight:   203 lb 9.6 oz (92.4 kg)   Height:        Intake/Output Summary (Last 24 hours) at 10/02/2017 0956 Last data filed at 10/02/2017 4431 Gross per 24 hour  Intake 243 ml  Output 825 ml  Net -582 ml   Filed Weights   09/30/17 0446 10/01/17 0244 10/02/17 0541  Weight: 204 lb 14.4 oz (92.9 kg) 205 lb 3.2 oz (93.1 kg) 203 lb 9.6 oz (92.4 kg)    Telemetry    Vpaced- Personally Reviewed   Physical Exam   GEN: WD/WN, NAD Neck: supple, no JVD Cardiac: RRR Respiratory: CTA, no wheeze GI: Soft, NT/ND MS: No edema Neuro:  No focal  findings   Labs    Chemistry Recent Labs  Lab 09/30/17 0503 10/01/17 0614 10/02/17 0416  NA 136 136 137  K 3.9 4.0 3.9  CL 100* 103 106  CO2 24 24 23   GLUCOSE 150* 154* 167*  BUN 20 17 14   CREATININE 1.16 1.10 0.99  CALCIUM 9.1 9.1 9.1  PROT 6.0*  --   --   ALBUMIN 3.2*  --   --   AST 40  --   --   ALT 14*  --   --   ALKPHOS 81  --   --   BILITOT 1.8*  --   --   GFRNONAA 55* 58* >60  GFRAA >60 >60 >60  ANIONGAP 12 9 8      Hematology Recent Labs  Lab 09/30/17 0503 10/01/17 0614 10/02/17 0416  WBC 7.0 7.0 9.6  RBC 4.40 4.29 4.60  HGB 12.6* 12.3* 13.5  HCT 38.0* 37.0* 39.5  MCV 86.4 86.2 85.9  MCH 28.6 28.7 29.3  MCHC 33.2 33.2 34.2  RDW 14.7 14.8 14.6  PLT 227 219 239    Cardiac Enzymes Recent Labs  Lab 09/29/17 0514 09/29/17 1223 09/29/17 1840  TROPONINI 0.67* 3.83* 4.95*  Recent Labs  Lab 09/29/17 0127  TROPIPOC 0.04     BNP Recent Labs  Lab 09/29/17 0109  BNP 205.7*       Patient Profile     83 y.o. male with past medical history of severe coronary disease, ischemic cardiomyopathy, prior pacemaker, diabetes mellitus, hypertension admitted with non-ST elevation myocardial infarction.  Assessment & Plan    1 coronary artery disease/NSTEMI-Patient has severe coronary disease by cath that would best be served by coronary artery bypass and graft.  However he is felt to be too high risk given age and reduced LV function (Dr Roxan Hockey saw pt).  He has had progressive angina over the past year not controlled with medications.  Plan PCI of ramus intermedius today. Continue aspirin, plavix, heparin, statin and beta-blocker.   2 ischemic cardiomyopathy-continue toprol and lisinopril. Pt with some dyspnea this AM; may need gentle diuresis at DC.  3 prior pacemaker  4 chronic stage III kidney disease-Renal function stable this AM.   Possible DC in AM if pt stable following PCI.  For questions or updates, please contact [place] Please consult [email or web address] for contact info under Cardiology/STEMI.      Signed, Kirk Ruths, MD  10/02/2017, 9:56 AM

## 2017-10-02 NOTE — Progress Notes (Signed)
Progress Note  Patient Name: Randall Callahan Date of Encounter: 10/02/2017  Primary Cardiologist: Dr Crenshaw/Dr Curt Bears  Subjective   Pt mild dyspnea last PM; no chest pain  Inpatient Medications    Scheduled Meds: . aspirin EC  81 mg Oral Q breakfast  . atorvastatin  40 mg Oral QHS  . clopidogrel  75 mg Oral Daily  . hydrocortisone cream   Topical BID  . insulin aspart  0-5 Units Subcutaneous QHS  . insulin aspart  0-9 Units Subcutaneous TID WC  . isosorbide mononitrate  120 mg Oral Daily  . lisinopril  2.5 mg Oral Q breakfast  . metoprolol succinate  75 mg Oral QPC breakfast  . sodium chloride flush  3 mL Intravenous Q12H  . sodium chloride flush  3 mL Intravenous Q12H  . sodium chloride flush  3 mL Intravenous Q12H   Continuous Infusions: . sodium chloride    . sodium chloride    . sodium chloride    . sodium chloride    . heparin 1,300 Units/hr (09/30/17 1722)   PRN Meds: sodium chloride, sodium chloride, sodium chloride, acetaminophen **OR** acetaminophen, nitroGLYCERIN, sodium chloride flush, sodium chloride flush, sodium chloride flush   Vital Signs    Vitals:   10/01/17 0818 10/01/17 1914 10/02/17 0541 10/02/17 0700  BP: 106/61 (!) 108/52 111/64 103/60  Pulse: 75 80 79 78  Resp:  18 18 18   Temp:  98.9 F (37.2 C) 99.3 F (37.4 C) 97.8 F (36.6 C)  TempSrc:  Oral Oral Oral  SpO2: 95% 96% 97% 99%  Weight:   203 lb 9.6 oz (92.4 kg)   Height:        Intake/Output Summary (Last 24 hours) at 10/02/2017 0956 Last data filed at 10/02/2017 2836 Gross per 24 hour  Intake 243 ml  Output 825 ml  Net -582 ml   Filed Weights   09/30/17 0446 10/01/17 0244 10/02/17 0541  Weight: 204 lb 14.4 oz (92.9 kg) 205 lb 3.2 oz (93.1 kg) 203 lb 9.6 oz (92.4 kg)    Telemetry    Vpaced- Personally Reviewed   Physical Exam   GEN: WD/WN, NAD Neck: supple, no JVD Cardiac: RRR Respiratory: CTA, no wheeze GI: Soft, NT/ND MS: No edema Neuro:  No focal  findings   Labs    Chemistry Recent Labs  Lab 09/30/17 0503 10/01/17 0614 10/02/17 0416  NA 136 136 137  K 3.9 4.0 3.9  CL 100* 103 106  CO2 24 24 23   GLUCOSE 150* 154* 167*  BUN 20 17 14   CREATININE 1.16 1.10 0.99  CALCIUM 9.1 9.1 9.1  PROT 6.0*  --   --   ALBUMIN 3.2*  --   --   AST 40  --   --   ALT 14*  --   --   ALKPHOS 81  --   --   BILITOT 1.8*  --   --   GFRNONAA 55* 58* >60  GFRAA >60 >60 >60  ANIONGAP 12 9 8      Hematology Recent Labs  Lab 09/30/17 0503 10/01/17 0614 10/02/17 0416  WBC 7.0 7.0 9.6  RBC 4.40 4.29 4.60  HGB 12.6* 12.3* 13.5  HCT 38.0* 37.0* 39.5  MCV 86.4 86.2 85.9  MCH 28.6 28.7 29.3  MCHC 33.2 33.2 34.2  RDW 14.7 14.8 14.6  PLT 227 219 239    Cardiac Enzymes Recent Labs  Lab 09/29/17 0514 09/29/17 1223 09/29/17 1840  TROPONINI 0.67* 3.83* 4.95*  Recent Labs  Lab 09/29/17 0127  TROPIPOC 0.04     BNP Recent Labs  Lab 09/29/17 0109  BNP 205.7*       Patient Profile     83 y.o. male with past medical history of severe coronary disease, ischemic cardiomyopathy, prior pacemaker, diabetes mellitus, hypertension admitted with non-ST elevation myocardial infarction.  Assessment & Plan    1 coronary artery disease/NSTEMI-Patient has severe coronary disease by cath that would best be served by coronary artery bypass and graft.  However he is felt to be too high risk given age and reduced LV function (Dr Roxan Hockey saw pt).  He has had progressive angina over the past year not controlled with medications.  Plan PCI of ramus intermedius today. Continue aspirin, plavix, heparin, statin and beta-blocker.   2 ischemic cardiomyopathy-continue toprol and lisinopril. Pt with some dyspnea this AM; may need gentle diuresis at DC.  3 prior pacemaker  4 chronic stage III kidney disease-Renal function stable this AM.   Possible DC in AM if pt stable following PCI.  For questions or updates, please contact [place] Please consult [email or web address] for contact info under Cardiology/STEMI.      Signed, Kirk Ruths, MD  10/02/2017, 9:56 AM

## 2017-10-02 NOTE — Care Management Important Message (Signed)
Important Message  Patient Details  Name: Randall Callahan MRN: [id number] Date of Birth: January 24, 1928   Medicare Important Message Given:  Yes    Raed Schalk 10/02/2017, 12:15 PM

## 2017-10-02 NOTE — Interval H&P Note (Signed)
Cath Lab Visit (complete for each Cath Lab visit)  Clinical Evaluation Leading to the Procedure:   ACS: Yes.    Non-ACS:    Anginal Classification: CCS IV  Anti-ischemic medical therapy: Minimal Therapy (1 class of medications)  Non-Invasive Test Results: No non-invasive testing performed  Prior CABG: No previous CABG      History and Physical Interval Note:  10/02/2017 2:34 PM  Randall Callahan  has presented today for surgery, with the diagnosis of CAD  The various methods of treatment have been discussed with the patient and family. After consideration of risks, benefits and other options for treatment, the patient has consented to  Procedure(s): CORONARY STENT INTERVENTION (N/A) as a surgical intervention .  The patient's history has been reviewed, patient examined, no change in status, stable for surgery.  I have reviewed the patient's chart and labs.  Questions were answered to the patient's satisfaction.     Larae Grooms

## 2017-10-02 NOTE — Progress Notes (Signed)
PROGRESS NOTE    Randall Callahan  MOQ:[id number] DOB: 04/14/1928 DOA: 09/29/2017 PCP: Dineen Kid, MD    Brief Narrative:  Randall Callahan  is a 83 y.o. male, w  CAD, CM (EF 44%), Dm2 Hypertension, apparently c/o jaw pain and numbness in bilateral arms.    Assessment & Plan:   Principal Problem:   CHF (congestive heart failure) (HCC) Active Problems:   Type 2 diabetes mellitus without complications (HCC)   Hyperkalemia   Hyponatremia   Non-ST elevation (NSTEMI) myocardial infarction ([place])   1-N-STEMI;  Presents with jaw pain, positive troponin up to 4.  Had cath; with 2 vessels diseases. CABG vs hight risk PCI.  Cardiology recommending CVTS consult. CVTS consulted. Too high risk for surgery.  Plan for PCI today On IV heparin. On Aspirin, lipitor, Imdur.  Plan to repeat ECHO. EF 35 %.  Chest pain free Report mild dyspnea last night. Will check chest x ray.   2-History of ischemic cardiomyopathy On lisinopril, metoprolol.   3-History of sick sinus syndrome status post pacemaker placement Per cardiology.  4-Diabetes mellitus type 2 Follow with CBGs.  SSI.    DVT prophylaxis: Heparin.  Code Status: full code.  Family Communication: son and daughter in law at bedside.  Disposition Plan: to be determine.    Consultants:   Cardiology  CVTS   Procedures:   Cath   Antimicrobials: none   Subjective: He didn't had a good night. He was having some SOB. He is feeling better.  He denies chest pain   Objective: Vitals:   10/01/17 0818 10/01/17 1914 10/02/17 0541 10/02/17 0700  BP: 106/61 (!) 108/52 111/64 103/60  Pulse: 75 80 79 78  Resp:  18 18 18   Temp:  98.9 F (37.2 C) 99.3 F (37.4 C) 97.8 F (36.6 C)  TempSrc:  Oral Oral Oral  SpO2: 95% 96% 97% 99%  Weight:   92.4 kg (203 lb 9.6 oz)   Height:        Intake/Output Summary (Last 24 hours) at 10/02/2017 1006 Last data filed at 10/02/2017 3546 Gross per 24 hour  Intake 243 ml  Output 825 ml    Net -582 ml   Filed Weights   09/30/17 0446 10/01/17 0244 10/02/17 0541  Weight: 92.9 kg (204 lb 14.4 oz) 93.1 kg (205 lb 3.2 oz) 92.4 kg (203 lb 9.6 oz)    Examination:  General exam: NAD Respiratory system: CTA Cardiovascular system:  S 1, S 2 Systolic Murmur Gastrointestinal system: BS resent, soft, nt Central nervous system: Alert, oriented, non focal.  Extremities; Symmetric power Skin: No rashes, lesions or ulcers   Data Reviewed: I have personally reviewed following labs and imaging studies  CBC: Recent Labs  Lab 09/29/17 0109 09/30/17 0503 10/01/17 0614 10/02/17 0416  WBC 11.1* 7.0 7.0 9.6  HGB 14.7 12.6* 12.3* 13.5  HCT 43.6 38.0* 37.0* 39.5  MCV 87.2 86.4 86.2 85.9  PLT 234 227 219 568   Basic Metabolic Panel: Recent Labs  Lab 09/29/17 0109 09/30/17 0503 10/01/17 0614 10/02/17 0416  NA 133* 136 136 137  K 5.2* 3.9 4.0 3.9  CL 101 100* 103 106  CO2 18* 24 24 23   GLUCOSE 202* 150* 154* 167*  BUN 17 20 17 14   CREATININE 1.29* 1.16 1.10 0.99  CALCIUM 9.3 9.1 9.1 9.1   GFR: Estimated Creatinine Clearance: 57.7 mL/min (by C-G formula based on SCr of 0.99 mg/dL). Liver Function Tests: Recent Labs  Lab 09/30/17 0503  AST 40  ALT 14*  ALKPHOS 81  BILITOT 1.8*  PROT 6.0*  ALBUMIN 3.2*   No results for input(s): LIPASE, AMYLASE in the last 168 hours. No results for input(s): AMMONIA in the last 168 hours. Coagulation Profile: Recent Labs  Lab 09/30/17 0503  INR 1.13   Cardiac Enzymes: Recent Labs  Lab 09/29/17 0514 09/29/17 1223 09/29/17 1840  TROPONINI 0.67* 3.83* 4.95*   BNP (last 3 results) No results for input(s): PROBNP in the last 8760 hours. HbA1C: No results for input(s): HGBA1C in the last 72 hours. CBG: Recent Labs  Lab 10/01/17 0716 10/01/17 1118 10/01/17 1651 10/01/17 2105 10/02/17 0725  GLUCAP 140* 168* 122* 147* 158*   Lipid Profile: No results for input(s): CHOL, HDL, LDLCALC, TRIG, CHOLHDL, LDLDIRECT in the  last 72 hours. Thyroid Function Tests: No results for input(s): TSH, T4TOTAL, FREET4, T3FREE, THYROIDAB in the last 72 hours. Anemia Panel: No results for input(s): VITAMINB12, FOLATE, FERRITIN, TIBC, IRON, RETICCTPCT in the last 72 hours. Sepsis Labs: No results for input(s): PROCALCITON, LATICACIDVEN in the last 168 hours.  No results found for this or any previous visit (from the past 240 hour(s)).       Radiology Studies: No results found.      Scheduled Meds: . aspirin EC  81 mg Oral Q breakfast  . atorvastatin  40 mg Oral QHS  . clopidogrel  75 mg Oral Daily  . hydrocortisone cream   Topical BID  . insulin aspart  0-5 Units Subcutaneous QHS  . insulin aspart  0-9 Units Subcutaneous TID WC  . isosorbide mononitrate  120 mg Oral Daily  . lisinopril  2.5 mg Oral Q breakfast  . metoprolol succinate  75 mg Oral QPC breakfast  . sodium chloride flush  3 mL Intravenous Q12H  . sodium chloride flush  3 mL Intravenous Q12H  . sodium chloride flush  3 mL Intravenous Q12H   Continuous Infusions: . sodium chloride    . sodium chloride    . sodium chloride    . sodium chloride    . heparin 1,300 Units/hr (09/30/17 1722)     LOS: 3 days    Time spent: 35 minutes.     Elmarie Shiley, MD [facility] Pager [phone]  If 7PM-7AM, please contact night-coverage [email or web address] Password [email or web address] 10/02/2017, 10:06 AM

## 2017-10-02 NOTE — Progress Notes (Signed)
[place] for heparin Indication: chest pain/ACS  No Known Allergies  Patient Measurements: Height: 6' (182.9 cm) Weight: 203 lb 9.6 oz (92.4 kg)(scale b) IBW/kg (Calculated) : 77.6 Heparin Dosing Weight: 94.3kg  Vital Signs: Temp: 97.8 F (36.6 C) (12/07 0700) Temp Source: Oral (12/07 0700) BP: 103/60 (12/07 0700) Pulse Rate: 78 (12/07 0700)  Labs: Recent Labs    09/29/17 1223  09/29/17 1840  09/30/17 0503 10/01/17 0614 10/02/17 0416  HGB  --   --   --    < > 12.6* 12.3* 13.5  HCT  --   --   --   --  38.0* 37.0* 39.5  PLT  --   --   --   --  227 219 239  LABPROT  --   --   --   --  14.4  --   --   INR  --   --   --   --  1.13  --   --   HEPARINUNFRC  --    < > 0.47  --  0.24* 0.39 0.37  CREATININE  --   --   --   --  1.16 1.10 0.99  TROPONINI 3.83*  --  4.95*  --   --   --   --    < > = values in this interval not displayed.    Estimated Creatinine Clearance: 57.7 mL/min (by C-G formula based on SCr of 0.99 mg/dL).    Assessment: 33 yom presented to the ED with CP now s/p cath with significant CAD Heparin level therapeutic   Goal of Therapy:  Heparin level 0.3-0.7 units/ml Monitor platelets by anticoagulation protocol: Yes   Plan: Heparin at 1300 units / hr  Follow up after PCI  Thank you Anette Guarneri, PharmD [phone] 10/02/2017 8:29 AM

## 2017-10-03 DIAGNOSIS — Z955 Presence of coronary angioplasty implant and graft: Secondary | ICD-10-CM

## 2017-10-03 DIAGNOSIS — Z950 Presence of cardiac pacemaker: Secondary | ICD-10-CM

## 2017-10-03 DIAGNOSIS — E119 Type 2 diabetes mellitus without complications: Secondary | ICD-10-CM

## 2017-10-03 DIAGNOSIS — I5022 Chronic systolic (congestive) heart failure: Secondary | ICD-10-CM

## 2017-10-03 DIAGNOSIS — I25118 Atherosclerotic heart disease of native coronary artery with other forms of angina pectoris: Secondary | ICD-10-CM

## 2017-10-03 LAB — CBC
HEMATOCRIT: 38.2 % — AB (ref 39.0–52.0)
HEMOGLOBIN: 12.8 g/dL — AB (ref 13.0–17.0)
MCH: 29.3 pg (ref 26.0–34.0)
MCHC: 33.5 g/dL (ref 30.0–36.0)
MCV: 87.4 fL (ref 78.0–100.0)
Platelets: 220 10*3/uL (ref 150–400)
RBC: 4.37 MIL/uL (ref 4.22–5.81)
RDW: 14.9 % (ref 11.5–15.5)
WBC: 8.2 10*3/uL (ref 4.0–10.5)

## 2017-10-03 LAB — BASIC METABOLIC PANEL
ANION GAP: 11 (ref 5–15)
BUN: 15 mg/dL (ref 6–20)
CO2: 20 mmol/L — AB (ref 22–32)
Calcium: 8.9 mg/dL (ref 8.9–10.3)
Chloride: 105 mmol/L (ref 101–111)
Creatinine, Ser: 1.2 mg/dL (ref 0.61–1.24)
GFR calc Af Amer: >60 mL/min (ref >60)
GFR calc non Af Amer: 53 mL/min — ABNORMAL LOW (ref >60)
GLUCOSE: 137 mg/dL — AB (ref 65–99)
POTASSIUM: 4.5 mmol/L (ref 3.5–5.1)
Sodium: 136 mmol/L (ref 135–145)

## 2017-10-03 LAB — GLUCOSE, CAPILLARY: Glucose-Capillary: 131 mg/dL — ABNORMAL HIGH (ref 65–99)

## 2017-10-03 MED ORDER — METFORMIN HCL 1000 MG PO TABS: 1000.0000 mg | ORAL_TABLET | Freq: Two times a day (BID) | ORAL | 0 refills | Status: DC

## 2017-10-03 MED ORDER — CLOPIDOGREL BISULFATE 75 MG PO TABS: 75.0000 mg | ORAL_TABLET | Freq: Every day | ORAL | 0 refills | Status: AC

## 2017-10-03 MED ORDER — ISOSORBIDE MONONITRATE ER 60 MG PO TB24: 120.0000 mg | ORAL_TABLET | Freq: Every day | ORAL | 0 refills | Status: DC

## 2017-10-03 MED ORDER — LISINOPRIL 2.5 MG PO TABS: 2.5000 mg | ORAL_TABLET | Freq: Every day | ORAL | 0 refills | Status: DC

## 2017-10-03 MED ORDER — ATORVASTATIN CALCIUM 40 MG PO TABS: 40.0000 mg | ORAL_TABLET | Freq: Every day | ORAL | 0 refills | Status: AC

## 2017-10-03 MED ORDER — NITROGLYCERIN 0.4 MG SL SUBL: 0.4000 mg | SUBLINGUAL_TABLET | SUBLINGUAL | 1 refills | Status: AC | PRN

## 2017-10-03 MED ORDER — METOPROLOL SUCCINATE ER 25 MG PO TB24: 75.0000 mg | ORAL_TABLET | Freq: Every day | ORAL | 0 refills | Status: DC

## 2017-10-03 NOTE — Progress Notes (Signed)
CARDIAC REHAB PHASE I   PRE:  Rate/Rhythm: 96  BP:  Sitting: 121/58       MODE:  Ambulation: 300 ft   POST:  Rate/Rhythm: 105  BP:  Sitting: 144/51      9:37am-10:30am  Patient ambulated independently with mild shortness of breath. No other complaints. Education completed with son and patient. Patient wants to come to Cardiac Rehab at [facility].   [place], [place] 10/03/2017 10:21 AM

## 2017-10-03 NOTE — Discharge Summary (Signed)
Physician Discharge Summary  Randall Callahan PXT:[id number] DOB: 1928-02-24 DOA: 09/29/2017  PCP: Dineen Kid, MD  Admit date: 09/29/2017 Discharge date: 10/03/2017  Admitted From: Home  Disposition: Home   Recommendations for Outpatient Follow-up:  1. Follow up with PCP in 1-2 weeks 2. Please obtain BMP/CBC in one week 3.   Home Health: none  Discharge Condition: stable.  CODE STATUS: full code.  Diet recommendation: Heart Healthy  Brief/Interim Summary: LeonardKrasowskiis a52 y.o.male,w CAD, CM (EF 44%), Dm2 Hypertension, apparently c/o jaw pain and numbness in bilateral arms.   Assessment & Plan:   Principal Problem:   CHF (congestive heart failure) (HCC) Active Problems:   Type 2 diabetes mellitus without complications (HCC)   Hyperkalemia   Hyponatremia   Non-ST elevation (NSTEMI) myocardial infarction ([place])   1-N-STEMI;  Presents with jaw pain, positive troponin up to 4.  Had cath; with 2 vessels diseases. CABG vs hight risk PCI.  Cardiology recommending CVTS consult. CVTS consulted. Too high risk for surgery.  Underwent PCI , plan to discharge on plavix , aspirin On Aspirin, lipitor, Imdur.  Plan to repeat ECHO. EF 35 %.  Chest pain free Chest x ray clear.  stable for discharge   2-History of ischemic cardiomyopathy On lisinopril, metoprolol.   3-History of sick sinus syndrome status post pacemaker placement Per cardiology.  4-Diabetes mellitus type 2 Follow with CBGs. SSI.     Discharge Diagnoses:  Principal Problem:   CHF (congestive heart failure) (HCC) Active Problems:   Type 2 diabetes mellitus without complications (HCC)   Hyperkalemia   Hyponatremia   Non-ST elevation (NSTEMI) myocardial infarction [facility])   Dyspnea    Discharge Instructions  Discharge Instructions    Diet - low sodium heart healthy   Complete by:  As directed    Increase activity slowly   Complete by:  As directed      Allergies as of 10/03/2017    No Known Allergies     Medication List    TAKE these medications   aspirin EC 81 MG tablet Take 81 mg by mouth daily with breakfast.   atorvastatin 40 MG tablet Commonly known as:  LIPITOR Take 1 tablet (40 mg total) by mouth at bedtime.   clopidogrel 75 MG tablet Commonly known as:  PLAVIX Take 1 tablet (75 mg total) by mouth daily with breakfast.   FISH OIL PO Take 1,600 mg by mouth daily.   isosorbide mononitrate 60 MG 24 hr tablet Commonly known as:  IMDUR Take 2 tablets (120 mg total) by mouth daily.   lisinopril 2.5 MG tablet Commonly known as:  PRINIVIL,ZESTRIL Take 1 tablet (2.5 mg total) by mouth daily with breakfast.   metFORMIN 1000 MG tablet Commonly known as:  GLUCOPHAGE Take 1 tablet (1,000 mg total) by mouth 2 (two) times daily with a meal. Start taking on:  10/04/2017   metoprolol succinate 25 MG 24 hr tablet Commonly known as:  TOPROL-XL Take 3 tablets (75 mg total) by mouth daily after breakfast. Take with or immediately following a meal. What changed:  medication strength   nitroGLYCERIN 0.4 MG SL tablet Commonly known as:  NITROSTAT Place 1 tablet (0.4 mg total) under the tongue every 5 (five) minutes as needed for chest pain. Up to 3 doses for chest pain. If pain has not subsided after 3 doses go to the ER.      Follow-up Information    [name], MD Follow up.   Specialty:  Cardiology Why:  Dr.  Crenshaw's office will call you to schedule a follow up appointment. Contact information: [address] [place] [address] [phone]          No Known Allergies  Consultations:  Cardiology    Procedures/Studies: Dg Chest 2 View  Result Date: 09/29/2017 CLINICAL DATA:  Initial evaluation for acute chest pain. Job discomfort, bilateral upper arm pain. EXAM: CHEST  2 VIEW COMPARISON:  Prior radiograph from 02/17/2017. FINDINGS: The cardiac and mediastinal silhouettes are stable in size and contour, and remain  within normal limits. Left-sided pacemaker/AICD in place. Aortic atherosclerosis. Lungs are mildly hyperinflated. Underlying chronic lung changes noted, grossly similar to previous. Superimposed diffuse vascular and interstitial prominence, consistent with pulmonary interstitial edema. Evaluation for pleural effusion limited as the costophrenic angles are incompletely visualized. No consolidative airspace disease. No pneumothorax. No acute osseus abnormality.  Osteopenia. IMPRESSION: 1. Chronic lung changes with superimposed diffuse interstitial prominence, compatible with mild pulmonary interstitial edema. 2. Aortic atherosclerosis. Electronically Signed   By: Jeannine Boga M.D.   On: 09/29/2017 02:43   Dg Chest Port 1 View  Result Date: 10/02/2017 CLINICAL DATA:  Shortness of breath. EXAM: PORTABLE CHEST 1 VIEW COMPARISON:  09/29/2017 FINDINGS: Heart size remains normal. Pacemaker appears the same. Interstitial edema seen 3 days ago has resolved. Mild background pulmonary scarring. No infiltrate, collapse or effusion. IMPRESSION: No active disease. Resolution of previously seen interstitial edema. Electronically Signed   By: Nelson Chimes M.D.   On: 10/02/2017 10:40    (Echo, Carotid, EGD, Colonoscopy, ERCP)    Subjective: Feeling well, denies chest pain   Discharge Exam: Vitals:   10/03/17 0447 10/03/17 0744  BP: (!) 119/50 (!) 94/38  Pulse: 70 76  Resp: (!) 23 (!) 30  Temp: 98.7 F (37.1 C) 98.6 F (37 C)  SpO2: 95% 96%   Vitals:   10/02/17 1900 10/02/17 2000 10/03/17 0447 10/03/17 0744  BP: (!) 95/35 (!) 103/45 (!) 119/50 (!) 94/38  Pulse: 72  70 76  Resp: (!) 28 (!) 25 (!) 23 (!) 30  Temp: 98.7 F (37.1 C)  98.7 F (37.1 C) 98.6 F (37 C)  TempSrc: Oral  Oral Oral  SpO2: 94% 95% 95% 96%  Weight:   92.2 kg (203 lb 4.2 oz)   Height:        General: Pt is alert, awake, not in acute distress Cardiovascular: RRR, S1/S2 +, no rubs, no gallops Respiratory: CTA  bilaterally, no wheezing, no rhonchi Abdominal: Soft, NT, ND, bowel sounds + Extremities: no edema, no cyanosis    The results of significant diagnostics from this hospitalization (including imaging, microbiology, ancillary and laboratory) are listed below for reference.     Microbiology: No results found for this or any previous visit (from the past 240 hour(s)).   Labs: BNP (last 3 results) Recent Labs    09/29/17 0109  BNP 127.5*   Basic Metabolic Panel: Recent Labs  Lab 09/29/17 0109 09/30/17 0503 10/01/17 0614 10/02/17 0416 10/03/17 0409  NA 133* 136 136 137 136  K 5.2* 3.9 4.0 3.9 4.5  CL 101 100* 103 106 105  CO2 18* 24 24 23  20*  GLUCOSE 202* 150* 154* 167* 137*  BUN 17 20 17 14 15   CREATININE 1.29* 1.16 1.10 0.99 1.20  CALCIUM 9.3 9.1 9.1 9.1 8.9   Liver Function Tests: Recent Labs  Lab 09/30/17 0503  AST 40  ALT 14*  ALKPHOS 81  BILITOT 1.8*  PROT 6.0*  ALBUMIN 3.2*  No results for input(s): LIPASE, AMYLASE in the last 168 hours. No results for input(s): AMMONIA in the last 168 hours. CBC: Recent Labs  Lab 09/29/17 0109 09/30/17 0503 10/01/17 0614 10/02/17 0416 10/03/17 0409  WBC 11.1* 7.0 7.0 9.6 8.2  HGB 14.7 12.6* 12.3* 13.5 12.8*  HCT 43.6 38.0* 37.0* 39.5 38.2*  MCV 87.2 86.4 86.2 85.9 87.4  PLT 234 227 219 239 220   Cardiac Enzymes: Recent Labs  Lab 09/29/17 0514 09/29/17 1223 09/29/17 1840  TROPONINI 0.67* 3.83* 4.95*   BNP: Invalid input(s): POCBNP CBG: Recent Labs  Lab 10/01/17 2105 10/02/17 0725 10/02/17 1116 10/02/17 2119 10/03/17 0633  GLUCAP 147* 158* 198* 193* 131*   D-Dimer No results for input(s): DDIMER in the last 72 hours. Hgb A1c No results for input(s): HGBA1C in the last 72 hours. Lipid Profile No results for input(s): CHOL, HDL, LDLCALC, TRIG, CHOLHDL, LDLDIRECT in the last 72 hours. Thyroid function studies No results for input(s): TSH, T4TOTAL, T3FREE, THYROIDAB in the last 72  hours.  Invalid input(s): FREET3 Anemia work up No results for input(s): VITAMINB12, FOLATE, FERRITIN, TIBC, IRON, RETICCTPCT in the last 72 hours. Urinalysis No results found for: COLORURINE, APPEARANCEUR, LABSPEC, [place], GLUCOSEU, HGBUR, BILIRUBINUR, KETONESUR, PROTEINUR, UROBILINOGEN, NITRITE, LEUKOCYTESUR Sepsis Labs Invalid input(s): PROCALCITONIN,  WBC,  LACTICIDVEN Microbiology No results found for this or any previous visit (from the past 240 hour(s)).   Time coordinating discharge: Over 30 minutes  SIGNED:   Elmarie Shiley, MD  [facility] 10/03/2017, 9:50 AM Pager   If 7PM-7AM, please contact night-coverage [email or web address] Password [email or web address]

## 2017-10-03 NOTE — Discharge Instructions (Signed)

## 2017-10-03 NOTE — Progress Notes (Signed)
Progress Note  Patient Name: Randall Callahan Date of Encounter: 10/03/2017  Primary Cardiologist: Dr Crenshaw/Dr Curt Bears  Subjective   He is doing well and denies chest pain, palpitations, shortness of breath.  He is eager to go home.  Inpatient Medications    Scheduled Meds: . aspirin  81 mg Oral Daily  . atorvastatin  40 mg Oral QHS  . clopidogrel  75 mg Oral Q breakfast  . hydrocortisone cream   Topical BID  . insulin aspart  0-5 Units Subcutaneous QHS  . insulin aspart  0-9 Units Subcutaneous TID WC  . isosorbide mononitrate  120 mg Oral Daily  . lisinopril  2.5 mg Oral Q breakfast  . metoprolol succinate  75 mg Oral QPC breakfast  . sodium chloride flush  3 mL Intravenous Q12H  . sodium chloride flush  3 mL Intravenous Q12H  . sodium chloride flush  3 mL Intravenous Q12H   Continuous Infusions: . sodium chloride    . sodium chloride    . sodium chloride     PRN Meds: sodium chloride, sodium chloride, sodium chloride, acetaminophen, nitroGLYCERIN, ondansetron (ZOFRAN) IV, sodium chloride flush, sodium chloride flush, sodium chloride flush   Vital Signs    Vitals:   10/02/17 1900 10/02/17 2000 10/03/17 0447 10/03/17 0744  BP: (!) 95/35 (!) 103/45 (!) 119/50 (!) 94/38  Pulse: 72  70 76  Resp: (!) 28 (!) 25 (!) 23 (!) 30  Temp: 98.7 F (37.1 C)  98.7 F (37.1 C) 98.6 F (37 C)  TempSrc: Oral  Oral Oral  SpO2: 94% 95% 95% 96%  Weight:   203 lb 4.2 oz (92.2 kg)   Height:        Intake/Output Summary (Last 24 hours) at 10/03/2017 0816 Last data filed at 10/03/2017 0744 Gross per 24 hour  Intake 240 ml  Output 1075 ml  Net -835 ml   Filed Weights   10/01/17 0244 10/02/17 0541 10/03/17 0447  Weight: 205 lb 3.2 oz (93.1 kg) 203 lb 9.6 oz (92.4 kg) 203 lb 4.2 oz (92.2 kg)    Telemetry    V pacing 60's-70's. Occ PVC - Personally Reviewed  ECG    Atrial-sensed ventricular-paced rhythm, 67 bpm - Personally Reviewed  Physical Exam   GEN: No acute  distress.   Neck: No JVD Cardiac: RRR, 3/6 apical holosystolic murmur, no rubs, or gallops.  Respiratory: Clear to auscultation bilaterally. GI: Soft, nontender, non-distended  MS: No edema; No deformity. Neuro:  Nonfocal  Psych: Normal affect   Labs    Chemistry Recent Labs  Lab 09/30/17 0503 10/01/17 0614 10/02/17 0416 10/03/17 0409  NA 136 136 137 136  K 3.9 4.0 3.9 4.5  CL 100* 103 106 105  CO2 24 24 23  20*  GLUCOSE 150* 154* 167* 137*  BUN 20 17 14 15   CREATININE 1.16 1.10 0.99 1.20  CALCIUM 9.1 9.1 9.1 8.9  PROT 6.0*  --   --   --   ALBUMIN 3.2*  --   --   --   AST 40  --   --   --   ALT 14*  --   --   --   ALKPHOS 81  --   --   --   BILITOT 1.8*  --   --   --   GFRNONAA 55* 58* >60 53*  GFRAA >60 >60 >60 >60  ANIONGAP 12 9 8 11      Hematology Recent Labs  Lab 10/01/17 [phone]  10/02/17 0416 10/03/17 0409  WBC 7.0 9.6 8.2  RBC 4.29 4.60 4.37  HGB 12.3* 13.5 12.8*  HCT 37.0* 39.5 38.2*  MCV 86.2 85.9 87.4  MCH 28.7 29.3 29.3  MCHC 33.2 34.2 33.5  RDW 14.8 14.6 14.9  PLT 219 239 220    Cardiac Enzymes Recent Labs  Lab 09/29/17 0514 09/29/17 1223 09/29/17 1840  TROPONINI 0.67* 3.83* 4.95*    Recent Labs  Lab 09/29/17 0127  TROPIPOC 0.04     BNP Recent Labs  Lab 09/29/17 0109  BNP 205.7*     DDimer No results for input(s): DDIMER in the last 168 hours.   Radiology    Dg Chest Port 1 View  Result Date: 10/02/2017 CLINICAL DATA:  Shortness of breath. EXAM: PORTABLE CHEST 1 VIEW COMPARISON:  09/29/2017 FINDINGS: Heart size remains normal. Pacemaker appears the same. Interstitial edema seen 3 days ago has resolved. Mild background pulmonary scarring. No infiltrate, collapse or effusion. IMPRESSION: No active disease. Resolution of previously seen interstitial edema. Electronically Signed   By: Nelson Chimes M.D.   On: 10/02/2017 10:40    Cardiac Studies   LEFT HEART CATH AND CORONARY ANGIOGRAPHY  09/30/17  Conclusion    There is severe  left ventricular systolic dysfunction.  LV end diastolic pressure is moderately elevated.  Prox LAD lesion is 100% stenosed.  Ost Ramus to Ramus lesion is 90% stenosed.  Ost Cx to Prox Cx lesion is 100% stenosed.  Prox RCA lesion is 30% stenosed.  1.  Severe two-vessel coronary artery disease with chronically occluded proximal to mid LAD at the distal segment of the previously placed stent with faint left to left and right-to-left collaterals.  Occluded proximal left circumflex at the previously placed stent with right to left collaterals.  Severe diffuse proximal disease in a large ramus branch.  The RCA has only mild disease. The coronary arteries are heavily calcified. 2.  Severely reduced LV systolic function with an EF of 25-30% with severe anterior and apical hypokinesis. 3.  Moderately elevated left ventricular end-diastolic pressure at 22 mmHg.  Recommendations: Obtain an echocardiogram to better evaluate ejection fraction and presence of valvular abnormalities.  Left ventricular angiography was performed with a hand injection to minimize contrast. Revascularization options are difficult with PCI.  I recommend CVTS consult to see if the patient is a candidate for CABG. If he is deemed to be not a candidate for CABG, high risk PCI of the ramus and possibly the LAD can be considered.  I do not think the left circumflex is approachable percutaneously. Resume heparin 8 hours after sheath pull. I discontinued clopidogrel in case CABG is needed. Optimize medically and diurese.    ______________________________________________________________________  CORONARY STENT INTERVENTION 10/02/17  Left Heart Cath  Conclusion    Prox LAD lesion is 100% stenosed.  Ost Cx to Prox Cx lesion is 100% stenosed.  Prox RCA lesion is 30% stenosed. Noted on prior cath. Right to left collaterals.  Ost Ramus to mid Ramus lesion is 90% stenosed.  A drug-eluting stent was successfully placed using  a STENT SIERRA 3.00 X 38 MM, treating the mid to distal disease, postdilated to 3.4-3.5 mm using the 3.25 stent balloon and the 3.5 balloon more proximally.  A drug-eluting stent was successfully placed using a STENT SIERRA 3.25 X 12 MM, treating the proximal ramus, postdilated to 3.5 mm.  Post intervention, there is a 0% residual stenosis.  LV end diastolic pressure is normal.  There is no aortic  valve stenosis.   Continue dual antiplatelet therapy for at least one year.   Could consider CTO PCI of the LAD in the future if this area is viable and he had refractory symptoms.     _________________________________________________________________________  Echocardiogram 10/01/2017 Study Conclusions  - Left ventricle: The cavity size was normal. There was moderate   concentric hypertrophy. Systolic function was moderately to   severely reduced. The estimated ejection fraction was in the   range of 30% to 35%. Dyskinesis of the apical myocardium. Severe   hypokinesis of the mid-apicalanteroseptal, anterior, and   inferoseptal myocardium; consistent with infarction in the   distribution of the left anterior descending coronary artery.   Features are consistent with a pseudonormal left ventricular   filling pattern, with concomitant abnormal relaxation and   increased filling pressure (grade 2 diastolic dysfunction).   Acoustic contrast opacification revealed no evidence ofthrombus. - Aortic valve: There was mild to moderate regurgitation directed   centrally in the LVOT. - Mitral valve: Calcified annulus. Mildly thickened leaflets .   There was malcoaptation of the valve leaflets. There was moderate   to severe regurgitation directed eccentrically and posteriorly. - Left atrium: The atrium was mildly dilated. - Pulmonary arteries: Systolic pressure was moderately increased.   PA peak pressure: 48 mm Hg (S).  Patient Profile     83 y.o. male with past medical history of severe  coronary disease, ischemic cardiomyopathy, prior pacemaker, diabetes mellitus, hypertension admitted with non-ST elevation myocardial infarction.  Assessment & Plan    1 Coronary artery disease/NSTEMI- Cath on 09/30/17 found severe CAD that would best be served by coronary artery bypass grafting. CV surgery was consulted and Dr. Roxan Hockey evaluated with patient. He felt that the patient is too high risk for surgery given his advanced age and reduced LV function. The patient has had progressive angina over the past year, not improved with medical management. He was taken back to the cath lab for PCI and DES X2 were placed in the ramus intermedius. He will continue DAPT for at least a year.  CTO PCI of the LAD could be considered in the future if this area is viable and he has refractory symptoms.   I will also continue Lipitor, nitrates, lisinopril, and metoprolol.  2 Ischemic cardiomyopathy- EF 30-35% on echo. Continue toprol and lisinopril. No signs of heart failure.  3 Prior pacemaker- is ventricular pacing. Stable.  4. Chronic CKD stage III- Mild creatinine bump this am to 1.20.    For questions or updates, please contact [place] Please consult [email or web address] for contact info under Cardiology/STEMI.      Signed, Daune Perch, NP  10/03/2017, 8:16 AM

## 2017-10-04 ENCOUNTER — Encounter (HOSPITAL_COMMUNITY): Payer: Self-pay | Admitting: Interventional Cardiology

## 2017-10-05 LAB — GLUCOSE, CAPILLARY: Glucose-Capillary: 145 mg/dL — ABNORMAL HIGH (ref 65–99)

## 2017-10-06 ENCOUNTER — Telehealth (HOSPITAL_COMMUNITY): Payer: Self-pay

## 2017-10-06 NOTE — Telephone Encounter (Signed)
Patient's insurance is active and benefits verified through Medicare Part A & B - No co-pay, no deductible, no out of pocket, no co-insurance, and no pre-authorization is required. Passport/reference [id number]  Patients insurance is active and benefits verified through [place] - No co-pay, no deductible, no out of pocket, no co-insurance, and no pre-authorization is required. Passport/reference [id number]  Patient will be contacted and scheduled after their follow up appt with the Cardiologist office upon review by the RN Navigator.

## 2017-10-07 ENCOUNTER — Telehealth: Payer: Self-pay | Admitting: Cardiology

## 2017-10-07 NOTE — Telephone Encounter (Signed)
Patient's daughter in law [place] ([place] on file) calling and is very upset that no one has called her back yet. She states that she called and left a message for the on-call doctor while our office was closed. She states that she has been trying to speak to someone for the past several days. I apologized that our office has been closed due to the snow that I would be glad to address her concerns now. [name] states that she is very frustrated that she has been on hold for over 30 minutes while someone talks to the DOD. I am not aware that anyone is speaking to the DOD as the phone call just came through to triage.    [name] states that the patient just had a stent done last week. She states that the patient has had a cold, a cough, and has had congestion. She states that she believes he got sick while he was in the hospital. She states that the patient is not having any chest pain, arm pain, jaw pain, SOB, lightheadedness, dizziness, or any other cardiac symptoms. She states that he does not have a fever and that his cath site looks good. She states that he does not have any bleeding, swelling, numbness, tingling, temperature or color changes, or any other problems related to his cath site. Advised for the patient to follow up with his PCP regarding his cough and congestion. She states that she was told that they would receive a call about following up in our office. Offered appointment for patient to follow up with Cecilie Kicks, NP on 12/18 at 3:00 PM. Daughter in law is demanding that we address the patient's cough and congestion today. I explained to the daughter in law that we will be glad to address any cardiac issues the patient is having and he can be seen on 12/18 for his post cath f/u, but he will need to follow-up with his PCP regarding his cough and congestion. She then agreed to the appointment. She verbalized understanding.

## 2017-10-07 NOTE — Telephone Encounter (Signed)
New Message  Pt daughter states she spoke with on call doctor and has not received a call back . Pt state she would like to speak with someone about pts cough. She does not want to take pt back to the hospital because she feels he got sick at the hospital.

## 2017-10-09 ENCOUNTER — Telehealth: Payer: Self-pay | Admitting: *Deleted

## 2017-10-09 ENCOUNTER — Telehealth: Payer: Self-pay

## 2017-10-09 NOTE — Telephone Encounter (Signed)
Message has been sent to admin pool and scheduler is aware to schedule.   

## 2017-10-09 NOTE — Telephone Encounter (Signed)
-----   Message from Daune Perch, NP sent at 10/03/2017  9:16 AM EST ----- Please schedule this patient for a follow-up appointment and call them with that information.  Primary Cardiologist: Dr. Stanford Breed Date of Discharge: 10/03/2017 Appointment Needed Within: 1-2 weeks Appointment Type: hospital follow up, post cath  Thank you! Daune Perch, AGNP-C 10/03/2017  9:19 AM Pager: [phone]

## 2017-10-09 NOTE — Telephone Encounter (Signed)
From: Daune Perch, NP  Sent: 10/03/2017  9:16 AM  To: [facility] Clinical Pool   Please schedule this patient for a follow-up appointment and call them with that information.   Primary Cardiologist: Dr. Stanford Breed  Date of Discharge: 10/03/2017  Appointment Needed Within: 1-2 weeks  Appointment Type: hospital follow up, post cath   Thank you!  Daune Perch, AGNP-C  10/03/2017 9:19 AM  Pager: [phone]   Late entry:  Message sent to scheduling.  Appt made to see [facility] PA-C 10-13-17 3PM

## 2017-10-12 ENCOUNTER — Telehealth: Payer: Self-pay | Admitting: Cardiology

## 2017-10-12 NOTE — Telephone Encounter (Signed)
New Message  Pt daughter in law call requesting to speak with RN about some concerns post hospital visit. She would like to speak with someone before scheduling f/u appt.

## 2017-10-12 NOTE — Telephone Encounter (Signed)
Left message for pt to call.

## 2017-10-13 ENCOUNTER — Ambulatory Visit: Payer: Medicare Other | Admitting: Cardiology

## 2017-10-14 NOTE — Telephone Encounter (Signed)
Left message for pt dtr n law to call

## 2017-10-14 NOTE — Telephone Encounter (Signed)
Spoke with pt dtr, she is very frustrated about the recent discharge and the problems she had getting through to our office. Questions regarding discharge instructions and Follow up scheduled with app post hosp.

## 2017-10-19 DIAGNOSIS — M545 Low back pain, unspecified: Secondary | ICD-10-CM | POA: Insufficient documentation

## 2017-10-19 DIAGNOSIS — N183 Chronic kidney disease, stage 3 unspecified: Secondary | ICD-10-CM | POA: Insufficient documentation

## 2017-10-19 DIAGNOSIS — I25119 Atherosclerotic heart disease of native coronary artery with unspecified angina pectoris: Secondary | ICD-10-CM | POA: Insufficient documentation

## 2017-10-19 DIAGNOSIS — K5790 Diverticulosis of intestine, part unspecified, without perforation or abscess without bleeding: Secondary | ICD-10-CM | POA: Insufficient documentation

## 2017-10-19 DIAGNOSIS — J189 Pneumonia, unspecified organism: Secondary | ICD-10-CM | POA: Insufficient documentation

## 2017-10-19 DIAGNOSIS — E7800 Pure hypercholesterolemia, unspecified: Secondary | ICD-10-CM | POA: Insufficient documentation

## 2017-10-19 DIAGNOSIS — I2510 Atherosclerotic heart disease of native coronary artery without angina pectoris: Secondary | ICD-10-CM | POA: Insufficient documentation

## 2017-10-19 DIAGNOSIS — R001 Bradycardia, unspecified: Secondary | ICD-10-CM | POA: Insufficient documentation

## 2017-10-19 DIAGNOSIS — R0789 Other chest pain: Secondary | ICD-10-CM | POA: Insufficient documentation

## 2017-10-19 DIAGNOSIS — T8859XA Other complications of anesthesia, initial encounter: Secondary | ICD-10-CM | POA: Insufficient documentation

## 2017-10-19 DIAGNOSIS — T4145XA Adverse effect of unspecified anesthetic, initial encounter: Secondary | ICD-10-CM | POA: Insufficient documentation

## 2017-10-19 DIAGNOSIS — D1771 Benign lipomatous neoplasm of kidney: Secondary | ICD-10-CM | POA: Insufficient documentation

## 2017-10-19 DIAGNOSIS — I10 Essential (primary) hypertension: Secondary | ICD-10-CM | POA: Insufficient documentation

## 2017-10-19 DIAGNOSIS — G8929 Other chronic pain: Secondary | ICD-10-CM | POA: Insufficient documentation

## 2017-10-19 DIAGNOSIS — I200 Unstable angina: Secondary | ICD-10-CM | POA: Insufficient documentation

## 2017-10-19 DIAGNOSIS — R079 Chest pain, unspecified: Secondary | ICD-10-CM | POA: Insufficient documentation

## 2017-10-19 DIAGNOSIS — D049 Carcinoma in situ of skin, unspecified: Secondary | ICD-10-CM | POA: Insufficient documentation

## 2017-10-19 DIAGNOSIS — Z8679 Personal history of other diseases of the circulatory system: Secondary | ICD-10-CM | POA: Insufficient documentation

## 2017-10-19 DIAGNOSIS — Z9861 Coronary angioplasty status: Secondary | ICD-10-CM

## 2017-10-19 DIAGNOSIS — K222 Esophageal obstruction: Secondary | ICD-10-CM | POA: Insufficient documentation

## 2017-10-19 DIAGNOSIS — C44300 Unspecified malignant neoplasm of skin of unspecified part of face: Secondary | ICD-10-CM | POA: Insufficient documentation

## 2017-10-19 DIAGNOSIS — G629 Polyneuropathy, unspecified: Secondary | ICD-10-CM | POA: Insufficient documentation

## 2017-10-19 DIAGNOSIS — K219 Gastro-esophageal reflux disease without esophagitis: Secondary | ICD-10-CM | POA: Insufficient documentation

## 2017-10-19 DIAGNOSIS — M1990 Unspecified osteoarthritis, unspecified site: Secondary | ICD-10-CM | POA: Insufficient documentation

## 2017-10-19 DIAGNOSIS — G3184 Mild cognitive impairment, so stated: Secondary | ICD-10-CM | POA: Insufficient documentation

## 2017-10-22 ENCOUNTER — Encounter: Payer: Self-pay | Admitting: Cardiology

## 2017-10-22 ENCOUNTER — Ambulatory Visit (INDEPENDENT_AMBULATORY_CARE_PROVIDER_SITE_OTHER): Payer: Medicare Other | Admitting: Cardiology

## 2017-10-22 DIAGNOSIS — I214 Non-ST elevation (NSTEMI) myocardial infarction: Secondary | ICD-10-CM

## 2017-10-22 DIAGNOSIS — E782 Mixed hyperlipidemia: Secondary | ICD-10-CM

## 2017-10-22 DIAGNOSIS — I255 Ischemic cardiomyopathy: Secondary | ICD-10-CM | POA: Diagnosis not present

## 2017-10-22 DIAGNOSIS — E119 Type 2 diabetes mellitus without complications: Secondary | ICD-10-CM

## 2017-10-22 DIAGNOSIS — I2510 Atherosclerotic heart disease of native coronary artery without angina pectoris: Secondary | ICD-10-CM | POA: Diagnosis not present

## 2017-10-22 DIAGNOSIS — Z9861 Coronary angioplasty status: Secondary | ICD-10-CM

## 2017-10-22 MED ORDER — LISINOPRIL 2.5 MG PO TABS: 2.5000 mg | ORAL_TABLET | Freq: Every evening | ORAL | 0 refills | Status: AC

## 2017-10-22 NOTE — Assessment & Plan Note (Signed)
On statin Rx 

## 2017-10-22 NOTE — Patient Instructions (Signed)
Medication Instructions:  START TAKING LISINOPRIL IN THE EVENING  If you need a refill on your cardiac medications before your next appointment, please call your pharmacy.  Follow-Up: Your physician wants you to follow-up in: [place].   Thank you for choosing [facility] at [facility]!!

## 2017-10-22 NOTE — Assessment & Plan Note (Signed)
Admitted 09/29/2017-peak Troponin 4.9

## 2017-10-22 NOTE — Assessment & Plan Note (Signed)
EF 30% Dec 2018

## 2017-10-22 NOTE — Progress Notes (Signed)
10/22/2017 Randall Callahan   17-Sep-1928  [id number]  Primary Physician Via, [name], MD Primary Cardiologist: Dr Randall Callahan- Dr Randall Callahan EP  HPI:  83 yo man with a long standing history of CAD. He had stents placed in the LAD and circumflex in 2003. He has known ischemic cardiomyopathy. He says that within a couple of years one of the stents was occluded. The LAD was definitely occluded by 2008 as documented by cath. He had a Research officer, political party pacemaker implanted in 2015. He has been splitting time between [place] and [place]. Dr Randall Callahan saw him for the first time her in May 2018.  He had a non STEMI in 08/2016 that was treated medically. Cath at that time showed a totally occluded LAD, 99% stenosis of his circumflex and moderate disease in the ramus branch. Did well though he would report angina if his HR got over 100. He presented to [facility] 09/27/17 with [place] and ruled in for a NSTEMI. Cath done 09/30/17 showed severe two-vessel coronary artery disease with chronically occluded proximal to mid LAD at the distal segment of the previously placed stent with faint left to left and right-to-left collaterals, and an occluded proximal left circumflex at the previously placed stent with right to left collaterals.  The large Ramus now had severe diffuse proximal disease. The RCA had only mild disease. The coronary arteries were heavily calcified. His EF was 30%.  He was seen in consult by CVTS. He was felt to be too high risk for CABG and the pt underwent PCI with DES to the RI on 10/02/17. The notes indicate we could consider intervention on his CTO LAD if he had recurrent symptoms refractory to medical Rx and there was documented viable myocardium in the distal LAD. The pt is in the office today accompanied by his daughter in law for follow up. After he was discharged he apparently developed bronchitis and he was treated with a course of ABs as an OP by his PCP. This improved but he still has a dry cough. He denies jaw pain (his  presenting symptoms), SOB, or orthopnea. He does say he has early fatigue with exertion.      Current Outpatient Medications  Medication Sig Dispense Refill  . aspirin EC 81 MG tablet Take 81 mg by mouth daily with breakfast.     . atorvastatin (LIPITOR) 40 MG tablet Take 1 tablet (40 mg total) by mouth at bedtime. 30 tablet 0  . clopidogrel (PLAVIX) 75 MG tablet Take 1 tablet (75 mg total) by mouth daily with breakfast. 30 tablet 0  . isosorbide mononitrate (IMDUR) 60 MG 24 hr tablet Take 2 tablets (120 mg total) by mouth daily. 30 tablet 0  . lisinopril (PRINIVIL,ZESTRIL) 2.5 MG tablet Take 1 tablet (2.5 mg total) by mouth every evening. 30 tablet 0  . metFORMIN (GLUCOPHAGE) 1000 MG tablet Take 1 tablet (1,000 mg total) by mouth 2 (two) times daily with a meal. 30 tablet 0  . metoprolol succinate (TOPROL-XL) 25 MG 24 hr tablet Take 3 tablets (75 mg total) by mouth daily after breakfast. Take with or immediately following a meal. 90 tablet 0  . nitroGLYCERIN (NITROSTAT) 0.4 MG SL tablet Place 1 tablet (0.4 mg total) under the tongue every 5 (five) minutes as needed for chest pain. Up to 3 doses for chest pain. If pain has not subsided after 3 doses go to the ER. 30 tablet 1  . Omega-3 Fatty Acids (FISH OIL PO) Take 1,600 mg by mouth  daily.     No current facility-administered medications for this visit.     No Known Allergies  Past Medical History:  Diagnosis Date  . Angina pectoris, unstable ([place])   . Angiomyolipoma of kidney   . Arthritis    "hips and knees" (09/29/2017)  . ASCVD (arteriosclerotic cardiovascular disease)   . Bradycardia   . Cancer [facility]) 2002   "iliac crest"  . Cancer of skin, face    "several" (09/29/2017)  . Cardiomyopathy ([place])   . Chest pain   . Chest tightness   . Chronic lower back pain   . CKD (chronic kidney disease), stage III ([address] [address] [place])    Randall Callahan 09/29/2017  . Complication of anesthesia    "my urinary tract becomes irritated; takes ~ 3-4 weeks to go away"  (09/29/2017)  . Coronary artery disease   . Diverticulosis   . GERD (gastroesophageal reflux disease)   . High cholesterol   . Hx of sick sinus syndrome   . Hypertension   . MCI (mild cognitive impairment)   . MI (myocardial infarction) ([place]) 2003; ~ 12/2016;   . NSTEMI (non-ST elevation myocardial infarction) ([place]) 08/2016   Randall Callahan 09/29/2017  . Peripheral neuropathy   . Pneumonia 2000s X ?2  . Presence of permanent cardiac pacemaker 2015  . Schatzki's ring   . Squamous cell carcinoma in situ of skin   . Type II diabetes mellitus ([place])     Social History   Socioeconomic History  . Marital status: Divorced    Spouse name: Not on file  . Number of children: 6  . Years of education: some college  . Highest education level: Not on file  Social Needs  . Financial resource strain: Not on file  . Food insecurity - worry: Not on file  . Food insecurity - inability: Not on file  . Transportation needs - medical: Not on file  . Transportation needs - non-medical: Not on file  Occupational History  . Occupation: Retired  Tobacco Use  . Smoking status: Former Smoker    Years: 2.00    Types: Cigarettes  . Smokeless tobacco: Never Used  . Tobacco comment: STARTED SMOKING IN 4TH GRADE, QUIT IN 6TH  Substance and Sexual Activity  . Alcohol use: Yes    Alcohol/week: 0.6 oz    Types: 1 Cans of beer per week  . Drug use: No  . Sexual activity: No  Other Topics Concern  . Not on file  Social History Narrative   Lives at home with son and daughter-in-law.   Right-handed.   2 cups caffeine per day.     Family History  Problem Relation Age of Onset  . Angina Mother   . Stroke Mother   . Other Father        Car accident  . Prostate cancer Brother   . Cancer Brother      Review of Systems: General: negative for chills, fever, night sweats or weight changes.  Cardiovascular: negative for chest pain, dyspnea on exertion, edema, orthopnea, palpitations, paroxysmal nocturnal  dyspnea or shortness of breath Dermatological: negative for rash Respiratory: negative for cough or wheezing Urologic: negative for hematuria Abdominal: negative for nausea, vomiting, diarrhea, bright red blood per rectum, melena, or hematemesis Neurologic: negative for visual changes, syncope, or dizziness All other systems reviewed and are otherwise negative except as noted above.    Blood pressure (!) 80/48, pulse 66, height 6' (1.829 m), weight 200 lb 9.6 oz (91 kg).  General appearance:  alert, cooperative, no distress and frail  Neck: no carotid bruit and no JVD Lungs: clear to auscultation bilaterally Heart: regular rate and rhythm Extremities: extremities normal, atraumatic, no cyanosis or edema Skin: pale, cool, dry Neurologic: Grossly normal  EKG paced  ASSESSMENT AND PLAN:   NSTEMI (non-ST elevation myocardial infarction) ([place])  Admitted 09/29/2017-peak Troponin 4.9  CAD S/P percutaneous coronary angioplasty H/O prior LAD and CFX stents- noted to be occluded with collaterals 10/03/17 High grade RI disease treated with PCI/DES 10/03/17 after he was felt to be too high risk for CAG  Ischemic cardiomyopathy EF 30% Dec 2018  Non-insulin treated type 2 diabetes mellitus ([place]) On Glucophage  Hyperlipidemia On statin Rx   PLAN  His B/P is a little low but he is not symptomatic. I suggested he take his Lisinopril in the PM instead of early AM. He should see Dr Randall Callahan in 2-3 months. We can consider transition to [facility] then if Dr Randall Callahan feels this is appropriate.   Kerin Ransom PA-C 10/22/2017 2:54 PM

## 2017-10-22 NOTE — Assessment & Plan Note (Signed)
On Glucophage 

## 2017-10-22 NOTE — Assessment & Plan Note (Signed)
H/O prior LAD and CFX stents- noted to be occluded with collaterals 10/03/17 High grade RI disease treated with PCI/DES 10/03/17 after he was felt to be too high risk for CAG

## 2017-10-23 ENCOUNTER — Telehealth: Payer: Self-pay | Admitting: Cardiology

## 2017-10-23 NOTE — Telephone Encounter (Signed)
Left a message to call back.

## 2017-10-23 NOTE — Telephone Encounter (Signed)
Spoke with the patient's daughter. She stated that cardiac rehab had called today to set up an appointment for the patient. It was her understanding that the patient should wait for rehab. Per Kerin Ransom, PA, the patient should wait 2 weeks before starting rehab. Per the request of the daughter, we will call cardiac rehab and explain that he should start in two weeks.

## 2017-10-23 NOTE — Progress Notes (Signed)
[facility] Note  10/28/2017     CHIEF COMPLAINT Patient presents for Diabetic Eye Exam   HISTORY OF PRESENT ILLNESS: Randall Callahan is a 83 y.o. male who presents to the clinic today for:   HPI    Diabetic Eye Exam    Associated Symptoms Negative for Flashes, Blind Spot, Photophobia, Scalp Tenderness, Fever, Floaters, Pain, Glare, Jaw Claudication, Weight Loss, Distortion, Redness, Trauma, Shoulder/Hip pain and Fatigue.  Diabetes characteristics include Type 2 and taking oral medications.  This started 25 years ago.  Blood sugar level is controlled.  Last Blood Glucose 130.  Last A1C: Pt states last A1C is unknown, but stays between 6-7.  I, the attending physician,  performed the HPI with the patient and updated documentation appropriately.          Comments    Pt presents today for DM exam, pt states he was dx 25 years ago, his A1C is unknown, but runs between 6-7, last BS was 130, pt had cat sx 5 years ago OD, had to be corrected a few years later and now has a horizontal blurry streak in his eye, OS has small cataract, denies flashes, floaters, pain or wavy vision, pt takes metformin for DM, pt has skin cancer removed from eye lid OD 6 weeks ago, also had some reconstructive sx done OD;       Last edited by Bernarda Caffey, MD on 10/28/2017 11:47 AM. (History)    Pt states he has not had an eye exam x 3 years; Pt reports that he has been seeing a "blurred line" centrally OD; Pt reports that he sleeps with his hear resting on right arm, pt states that st times when he wakes up he feels a type of sharp pain in OD, like a type of "brain freeze"; Pt states that he has never been dx with ARMD; Pt states he has had cataract sx OD with a laser; Pt denies any ocular trauma or injuries; Pt reports he had bilateral upper and lower lid sx for skin cancer; pt reports that he is diabetic but reports that he is well controled; Pt reports hx of heart attacks, denies any  hx of stroke;   Referring physician: Dineen Kid, MD [place], [place] [address]  HISTORICAL INFORMATION:   Selected notes from the MEDICAL RECORD NUMBER Referred by Self for DM exam;  LEE- 12.02.15 (J. Lally - [place])  Ocular Hx- cataract OS, pseudophakia OD (08.04.10 - J. Lally), Yag Cap OD (02.25.15 - J. Benita Gutter) PMH- DM type 2, pacemaker, hypercholesterolmia    CURRENT MEDICATIONS: No current outpatient medications on file. (Ophthalmic Drugs)   No current facility-administered medications for this visit.  (Ophthalmic Drugs)   Current Outpatient Medications (Other)  Medication Sig  . aspirin EC 81 MG tablet Take 81 mg by mouth daily with breakfast.   . atorvastatin (LIPITOR) 40 MG tablet Take 1 tablet (40 mg total) by mouth at bedtime.  . clopidogrel (PLAVIX) 75 MG tablet Take 1 tablet (75 mg total) by mouth daily with breakfast.  . isosorbide mononitrate (IMDUR) 60 MG 24 hr tablet Take 2 tablets (120 mg total) by mouth daily.  [name] lisinopril (PRINIVIL,ZESTRIL) 2.5 MG tablet Take 1 tablet (2.5 mg total) by mouth every evening.  . metFORMIN (GLUCOPHAGE) 1000 MG tablet Take 1 tablet (1,000 mg total) by mouth 2 (two) times daily with a meal.  . metoprolol succinate (TOPROL-XL) 25 MG 24 hr tablet Take 3  tablets (75 mg total) by mouth daily after breakfast. Take with or immediately following a meal.  . nitroGLYCERIN (NITROSTAT) 0.4 MG SL tablet Place 1 tablet (0.4 mg total) under the tongue every 5 (five) minutes as needed for chest pain. Up to 3 doses for chest pain. If pain has not subsided after 3 doses go to the ER.  . Omega-3 Fatty Acids (FISH OIL PO) Take 1,600 mg by mouth daily.   No current facility-administered medications for this visit.  (Other)      REVIEW OF SYSTEMS: ROS    Positive for: Endocrine, Eyes   Negative for: Constitutional, Gastrointestinal, Neurological, Skin, Genitourinary, Musculoskeletal, HENT, Cardiovascular, Respiratory, Psychiatric, Allergic/Imm,  Heme/Lymph   Last edited by Debbrah Alar, COT on 10/28/2017  9:49 AM. (History)       ALLERGIES No Known Allergies  PAST MEDICAL HISTORY Past Medical History:  Diagnosis Date  . Angina pectoris, unstable ([place])   . Angiomyolipoma of kidney   . Arthritis    "hips and knees" (09/29/2017)  . ASCVD (arteriosclerotic cardiovascular disease)   . Bradycardia   . Cancer [facility]) 2002   "iliac crest"  . Cancer of skin, face    "several" (09/29/2017)  . Cardiomyopathy ([place])   . Chest pain   . Chest tightness   . Chronic lower back pain   . CKD (chronic kidney disease), stage III ([place])    [name] 09/29/2017  . Complication of anesthesia    "my urinary tract becomes irritated; takes ~ 3-4 weeks to go away" (09/29/2017)  . Coronary artery disease   . Diverticulosis   . GERD (gastroesophageal reflux disease)   . High cholesterol   . Hx of sick sinus syndrome   . Hypertension   . MCI (mild cognitive impairment)   . MI (myocardial infarction) ([place]) 2003; ~ 12/2016;   . NSTEMI (non-ST elevation myocardial infarction) ([place]) 08/2016   [name] 09/29/2017  . Peripheral neuropathy   . Pneumonia 2000s X ?2  . Presence of permanent cardiac pacemaker 2015  . Schatzki's ring   . Squamous cell carcinoma in situ of skin   . Type II diabetes mellitus ([place])    Past Surgical History:  Procedure Laterality Date  . CARDIAC CATHETERIZATION  >2003 X 2   "couldn't unblock the stents that were blocked"  . CATARACT EXTRACTION    . CATARACT EXTRACTION W/ INTRAOCULAR LENS IMPLANT Right 2000s  . COLONOSCOPY W/ BIOPSIES AND POLYPECTOMY    . CORONARY ANGIOGRAPHY    . CORONARY ANGIOPLASTY WITH STENT PLACEMENT  2003   "2 stents"  . CORONARY STENT INTERVENTION N/A 10/02/2017   Procedure: CORONARY STENT INTERVENTION;  Surgeon: Jettie Booze, MD;  Location: [facility];  Service: Cardiovascular;  Laterality: N/A;  . ESOPHAGOGASTRODUODENOSCOPY    . EXCISIONAL HEMORRHOIDECTOMY    . INSERT / REPLACE /  REMOVE PACEMAKER  2015   [name] 09/29/2017  . JOINT REPLACEMENT    . LEFT HEART CATH N/A 10/02/2017   Procedure: Left Heart Cath;  Surgeon: Jettie Booze, MD;  Location: [facility];  Service: Cardiovascular;  Laterality: N/A;  . LEFT HEART CATH AND CORONARY ANGIOGRAPHY N/A 09/30/2017   Procedure: LEFT HEART CATH AND CORONARY ANGIOGRAPHY;  Surgeon: Wellington Hampshire, MD;  Location: [facility];  Service: Cardiovascular;  Laterality: N/A;  . MASS EXCISION Right 2002   "iliac crest cancer"  . SKIN CANCER EXCISION     "facial"  . TONSILLECTOMY    .  TOTAL KNEE ARTHROPLASTY Left ~ 2012  . VASCULAR SURGERY      FAMILY HISTORY Family History  Problem Relation Age of Onset  . Angina Mother   . Stroke Mother   . Other Father        Car accident  . Prostate cancer Brother   . Cancer Brother   . Macular degeneration Sister   . Amblyopia Neg Hx   . Blindness Neg Hx   . Cataracts Neg Hx   . Glaucoma Neg Hx   . Retinal detachment Neg Hx   . Strabismus Neg Hx   . Retinitis pigmentosa Neg Hx     SOCIAL HISTORY Social History   Tobacco Use  . Smoking status: Former Smoker    Years: 2.00    Types: Cigarettes  . Smokeless tobacco: Never Used  . Tobacco comment: STARTED SMOKING IN 4TH GRADE, QUIT IN 6TH  Substance Use Topics  . Alcohol use: Yes    Alcohol/week: 0.6 oz    Types: 1 Cans of beer per week  . Drug use: No         OPHTHALMIC EXAM:  Base Eye Exam    Visual Acuity (Snellen - Linear)      Right Left   Dist [place] 20/70 -2 20/50 -2   Dist ph [place] NI 20/40       Tonometry (Tonopen, 10:00 AM)      Right Left   Pressure 21 11       Tonometry #2 (Tonopen, 10:00 AM)      Right Left   Pressure 21        Pupils      Dark Light Shape React APD   Right 3 2 Round Minimal None   Left 3 2 Round Brisk None       Visual Fields (Counting fingers)      Left Right    Full Full       Extraocular Movement      Right Left    Full, Ortho Full, Ortho        Neuro/Psych    Oriented x3:  Yes   Mood/Affect:  Normal       Dilation    Both eyes:  1.0% Mydriacyl, 2.5% Phenylephrine @ 10:00 AM        Slit Lamp and Fundus Exam    External Exam      Right Left   External Brow ptosis Brow ptosis       Slit Lamp Exam      Right Left   Lids/Lashes Dermatochalasis - upper lid, well healed wound at lateral canthas  Dermatochalasis - upper lid, well healed wound at lateral canthas    Conjunctiva/Sclera White and quiet, corckscrew vessels nasally  White and quiet, corckscrew vessels nasally    Cornea 2+ Punctate epithelial erosions, Arcus nasal pteryigium , 2+ Punctate epithelial erosions, Arcus   Anterior Chamber Deep and quiet Deep and quiet   Iris Round, with poor dilation to 4.5 mm  Round and dilated to 7 mm   Lens PC IOL in good postion with open PC 3+ Nuclear sclerosis with brunescence , 2+ Cortical cataract   Vitreous syneresis syneresis       Fundus Exam      Right Left   Disc mild Pallor Normal   C/D Ratio 0.3 0.3   Macula Flat, Retinal pigment epithelial mottling, No heme or edema Flat, Retinal pigment epithelial mottling, No heme or edema   Vessels Vascular attenuation  Vascular attenuation   Periphery Attached; view limited by poor dilation, scattered DBH Attached, mild Reticular degeneration        Refraction    Manifest Refraction (Auto)      Sphere Cylinder Axis Dist VA   Right -2.75 +1.50 110 20/100   Left -1.00 +1.00 005 20/50  Difficult OD          IMAGING AND PROCEDURES  Imaging and Procedures for 10/28/17  OCT, Retina - OU - Both Eyes     Right Eye Quality was good. Central Foveal Thickness: 268. Progression has no prior data. Findings include normal foveal contour, no IRF, no SRF, epiretinal membrane.   Left Eye Quality was good. Central Foveal Thickness: 270. Progression has no prior data. Findings include normal foveal contour, no IRF, no SRF, epiretinal membrane.   Notes *Images captured and  stored on drive  Diagnosis / Impression:  OU: NFP; tr ERM OU; no IRF/SRF  Clinical management:  See below  Abbreviations: NFP - Normal foveal profile. CME - cystoid macular edema. PED - pigment epithelial detachment. IRF - intraretinal fluid. SRF - subretinal fluid. EZ - ellipsoid zone. ERM - epiretinal membrane. ORA - outer retinal atrophy. ORT - outer retinal tubulation. SRHM - subretinal hyper-reflective material ]                ASSESSMENT/PLAN:    ICD-10-CM   1. Diabetes mellitus type 2 without retinopathy ([place]) E11.9   2. Hypertensive retinopathy of both eyes H35.033   3. Retinal edema H35.81 OCT, Retina - OU - Both Eyes  4. 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 Pseudophakia of right eye Z96.1   7. Dry eye syndrome of right eye H04.121     1. Diabetes mellitus, type 2 without retinopathy - The incidence, risk factors for progression, natural history and treatment options for diabetic retinopathy  were discussed with patient.   - The need for close monitoring of blood glucose, blood pressure, and serum lipids, avoiding cigarette or any type of tobacco, and the need for long term follow up was also discussed with patient. - f/u in 6 year, sooner prn  2. Hypertensive retinopathy OU (OD > OS) - mild attenuation with scattered peripheral DBH OD - discussed importance of tight BP control - monitor  3. No retinal edema on exam or OCT  4. Optic disc pallor OD - mild pallor noted OD - extensive heart history with multiple stents - suspect pallor secondary to vascular etiology - monitor for now -- likely chronic - consider further work up if symptoms worsen\ - suspect DES also limiting vision OD -- see below  5. Combined forms of age-related cataract OS-  - The symptoms of cataract, surgical options, and treatments and risks were discussed with patient. - discussed diagnosis and progression - visually  significant -- recommend cataract evaluation - pt to call back with decision on cataract surgeon  6. Pseudophakia OD  - s/p CE/IOL  - beautiful surgery, doing well  - monitor  7. DES OD - h/o malignant lesions of eyelid s/p excision with reconstructions OD - well healed, no lagophthalmos, but still has some residual DES OD - recommend artificial tears and lubricating ointment as needed   Ophthalmic Meds Ordered this visit:  No orders of the defined types were placed in this encounter.      Return in about 6 months (around 04/27/2018).  There are no Patient  Instructions on file for this visit.   Explained the diagnoses, plan, and follow up with the patient and they expressed understanding.  Patient expressed understanding of the importance of proper follow up care.   This document serves as a record of services personally performed by Gardiner Sleeper, MD, PhD. It was created on their behalf by Catha Brow, [place], a certified ophthalmic assistant. The creation of this record is the provider's dictation and/or activities during the visit.  Electronically signed by: Catha Brow, [place]  10/28/17 11:58 AM    Gardiner Sleeper, M.D., Ph.D. Diseases & Surgery of the Retina and [place] 10/28/17  I have reviewed the above documentation for accuracy and completeness, and I agree with the above. Gardiner Sleeper, M.D., Ph.D. 10/28/17 11:58 AM     Abbreviations: M myopia (nearsighted); A astigmatism; H hyperopia (farsighted); P presbyopia; Mrx spectacle prescription;  CTL contact lenses; OD right eye; OS left eye; OU both eyes  XT exotropia; ET esotropia; PEK punctate epithelial keratitis; PEE punctate epithelial erosions; DES dry eye syndrome; MGD meibomian gland dysfunction; ATs artificial tears; PFAT's preservative free artificial tears; [place] nuclear sclerotic cataract; PSC posterior subcapsular cataract; ERM epi-retinal membrane; PVD posterior  vitreous detachment; RD retinal detachment; DM diabetes mellitus; DR diabetic retinopathy; NPDR non-proliferative diabetic retinopathy; PDR proliferative diabetic retinopathy; CSME clinically significant macular edema; DME diabetic macular edema; dbh dot blot hemorrhages; CWS cotton wool spot; POAG primary open angle glaucoma; C/D cup-to-disc ratio; HVF humphrey visual field; GVF goldmann visual field; OCT optical coherence tomography; IOP intraocular pressure; BRVO Branch retinal vein occlusion; CRVO central retinal vein occlusion; CRAO central retinal artery occlusion; BRAO branch retinal artery occlusion; RT retinal tear; SB scleral buckle; PPV pars plana vitrectomy; VH Vitreous hemorrhage; PRP panretinal laser photocoagulation; IVK intravitreal kenalog; VMT vitreomacular traction; MH Macular hole;  NVD neovascularization of the disc; NVE neovascularization elsewhere; AREDS age related eye disease study; ARMD age related macular degeneration; POAG primary open angle glaucoma; EBMD epithelial/anterior basement membrane dystrophy; ACIOL anterior chamber intraocular lens; IOL intraocular lens; PCIOL posterior chamber intraocular lens; Phaco/IOL phacoemulsification with intraocular lens placement; [place] photorefractive keratectomy; LASIK laser assisted in situ keratomileusis; HTN hypertension; DM diabetes mellitus; COPD chronic obstructive pulmonary disease

## 2017-10-23 NOTE — Telephone Encounter (Signed)
Patient daughter-in-law (nicole) calling, would like to clarify if patient needs to start cardiac rehab or not

## 2017-10-28 ENCOUNTER — Ambulatory Visit (INDEPENDENT_AMBULATORY_CARE_PROVIDER_SITE_OTHER): Payer: Medicare Other | Admitting: Ophthalmology

## 2017-10-28 ENCOUNTER — Encounter (INDEPENDENT_AMBULATORY_CARE_PROVIDER_SITE_OTHER): Payer: Self-pay | Admitting: Ophthalmology

## 2017-10-28 DIAGNOSIS — E119 Type 2 diabetes mellitus without complications: Secondary | ICD-10-CM | POA: Diagnosis not present

## 2017-10-28 DIAGNOSIS — Z961 Presence of intraocular lens: Secondary | ICD-10-CM | POA: Diagnosis not present

## 2017-10-28 DIAGNOSIS — H47291 Other optic atrophy, right eye: Secondary | ICD-10-CM

## 2017-10-28 DIAGNOSIS — H25812 Combined forms of age-related cataract, left eye: Secondary | ICD-10-CM | POA: Diagnosis not present

## 2017-10-28 DIAGNOSIS — H35033 Hypertensive retinopathy, bilateral: Secondary | ICD-10-CM

## 2017-10-28 DIAGNOSIS — H3581 Retinal edema: Secondary | ICD-10-CM | POA: Diagnosis not present

## 2017-10-28 DIAGNOSIS — H04121 Dry eye syndrome of right lacrimal gland: Secondary | ICD-10-CM

## 2017-10-30 ENCOUNTER — Telehealth (HOSPITAL_COMMUNITY): Payer: Self-pay

## 2017-10-30 NOTE — Telephone Encounter (Signed)
Called and spoke with Randall Callahan's relative Randall Callahan - She stated to give a call back on 11/06/2017 as Dr.Crenshaw stated to her that he would be ready two weeks from his appt on 10/22/2017.

## 2017-11-02 ENCOUNTER — Telehealth: Payer: Self-pay | Admitting: Cardiology

## 2017-11-02 NOTE — Telephone Encounter (Signed)
Spoke with patients daughter and changing the Lisinopril 2.5 mg in the evening has not made any difference. Systolic blood pressure has been below 100 since visit until today. He continues to feel "run down". Advised daughter will forward to Dr Stanford Breed for review

## 2017-11-02 NOTE — Telephone Encounter (Signed)
Decrease imdur to 30 mg daily and follow BP; continue lisinopril for now. Kirk Ruths

## 2017-11-02 NOTE — Telephone Encounter (Signed)
New message  Patient daughter calling with low BP concerns. Please call  Pt c/o BP issue: STAT if pt c/o blurred vision, one-sided weakness or slurred speech  1. What are your last 5 BP readings? 110/59, 86/50  2. Are you having any other symptoms (ex. Dizziness, headache, blurred vision, passed out)? No  3. What is your BP issue? BP loow

## 2017-11-02 NOTE — Telephone Encounter (Signed)
Advised daughter, verbalized understanding  

## 2017-11-04 ENCOUNTER — Telehealth: Payer: Self-pay | Admitting: Cardiology

## 2017-11-04 NOTE — Telephone Encounter (Signed)
New Message  STAT if HR is under 50 or over 120 (normal HR is 60-100 beats per minute)  1) What is your heart rate? 34  2) Do you have a log of your heart rate readings (document readings)? 36, 34  3) Do you have any other symptoms? no

## 2017-11-04 NOTE — Telephone Encounter (Signed)
Returned call to patient's daughter [name].She stated father checked B/P and pulse this morning 112/59 pulse 36.Stated he felt fine.His B/P now 112/64 pulse 74.Advised I will send message to device clinic.

## 2017-11-04 NOTE — Telephone Encounter (Signed)
Spoke with Randall Callahan and pt on speaker phone and requested that pt send in a manual transmission informed them that I would call back once transmission was received and reviewed

## 2017-11-04 NOTE — Telephone Encounter (Signed)
Called pt and Randall Callahan back informed that that transmission was received and pacemaker is functioning normally and pts heart rate on transmission was about 70bpm. Informed them that if pt has cold fingers or poor circulation this could cause erroneous readings sometimes. Ms. Tindel and pt both expressed interest in pt starting cardiac rehab informed them that I would relay this information over to Dr. Stanford Breed

## 2017-11-04 NOTE — Telephone Encounter (Signed)
Ok for cardiac rehab Randall Callahan

## 2017-11-09 NOTE — Telephone Encounter (Signed)
Left message for nicole, referral in place for cardiac rehab and okay to precipitate.

## 2017-11-11 ENCOUNTER — Telehealth (HOSPITAL_COMMUNITY): Payer: Self-pay

## 2017-11-11 NOTE — Telephone Encounter (Signed)
Called and spoke with Randall Callahan in regards to patient attending Cardiac Rehab - Patient was cleared by DR per Randall Callahan. Scheduled orientation on 12/15/2017 at 8:30am. Patient will attend the 8:15am exc class.

## 2017-11-16 ENCOUNTER — Ambulatory Visit (INDEPENDENT_AMBULATORY_CARE_PROVIDER_SITE_OTHER): Payer: Medicare Other | Admitting: *Deleted

## 2017-11-16 DIAGNOSIS — I495 Sick sinus syndrome: Secondary | ICD-10-CM

## 2017-11-16 NOTE — Progress Notes (Signed)
Remote pacemaker transmission.   

## 2017-11-17 ENCOUNTER — Encounter: Payer: Self-pay | Admitting: Cardiology

## 2017-11-25 ENCOUNTER — Telehealth: Payer: Self-pay | Admitting: Cardiology

## 2017-11-25 MED ORDER — METOPROLOL SUCCINATE ER 25 MG PO TB24: 75.0000 mg | ORAL_TABLET | Freq: Every day | ORAL | 1 refills | Status: AC

## 2017-11-25 NOTE — Telephone Encounter (Signed)
Refill sent to the pharmacy electronically.  

## 2017-11-25 NOTE — Telephone Encounter (Signed)
°*  STAT* If patient is at the pharmacy, call can be transferred to refill team.   1. Which medications need to be refilled? (please list name of each medication and dose if known) Metoprolol  2. Which pharmacy/location (including street and city if local pharmacy) is medication to be sent to?[facility] [phone]  3. Do they need a 30 day or 90 day supply? #270 and refills

## 2017-12-09 ENCOUNTER — Telehealth (HOSPITAL_COMMUNITY): Payer: Self-pay | Admitting: Pharmacist

## 2017-12-11 NOTE — Telephone Encounter (Signed)
Cardiac Rehab Medication Review by a Pharmacist  Does the patient feel that his/her medications are working for him/her?  yes  Has the patient been experiencing any side effects to the medications prescribed?  yes  Does the patient measure his/her own blood pressure or blood glucose at home?  yes   Does the patient have any problems obtaining medications due to transportation or finances?   no  Understanding of regimen: fair Understanding of indications: fair Potential of compliance: fair  Pharmacist comments: Patient presents for cardiac rehab orientation. Patient is able to recognize and confirm/deny each medication, as well as provide additional medications that were not prompted. He was having issues with low blood pressure, but after his dose of Imdur was decreased he has noticed that this has improved and he is feeling less tired. His blood pressure is usually around 120/60. He sometimes has issues with getting medication refills because his doctor does not write for refills on the prescription, or only provides a 2 week supply per fill, which is frustrating. I suggested that he relay this concern to his providers and his pharmacist, who may be able to help him get longer fills or more refills per script.   Mila Merry Gerarda Fraction, PharmD PGY1 Pharmacy Resident Pager: [phone] 12/11/2017 2:37 PM

## 2017-12-12 LAB — CUP PACEART REMOTE DEVICE CHECK
Date Time Interrogation Session: 20190216133137
Implantable Lead Implant Date: 20150916
Implantable Lead Implant Date: 20150916
Implantable Lead Location: 753860
Lead Channel Setting Pacing Amplitude: 1.625
Lead Channel Setting Pacing Amplitude: 1.875
Lead Channel Setting Pacing Pulse Width: 0.4 ms
Lead Channel Setting Sensing Sensitivity: 2 mV
MDC IDC LEAD LOCATION: 753859
MDC IDC PG IMPLANT DT: 20150916
Pulse Gen Serial Number: 7659331

## 2017-12-15 ENCOUNTER — Encounter (HOSPITAL_COMMUNITY)
Admission: RE | Admit: 2017-12-15 | Discharge: 2017-12-15 | Disposition: A | Discharge status: Home / Self Care | Payer: Medicare Other | Source: Ambulatory Visit | Attending: Cardiology | Admitting: Cardiology

## 2017-12-15 ENCOUNTER — Encounter (HOSPITAL_COMMUNITY): Payer: Self-pay

## 2017-12-15 VITALS — Ht 72.25 in | Wt 197.8 lb

## 2017-12-15 DIAGNOSIS — Z85820 Personal history of malignant melanoma of skin: Secondary | ICD-10-CM | POA: Insufficient documentation

## 2017-12-15 DIAGNOSIS — E1122 Type 2 diabetes mellitus with diabetic chronic kidney disease: Secondary | ICD-10-CM | POA: Diagnosis not present

## 2017-12-15 DIAGNOSIS — Z87891 Personal history of nicotine dependence: Secondary | ICD-10-CM | POA: Diagnosis not present

## 2017-12-15 DIAGNOSIS — Z7982 Long term (current) use of aspirin: Secondary | ICD-10-CM | POA: Insufficient documentation

## 2017-12-15 DIAGNOSIS — Z7984 Long term (current) use of oral hypoglycemic drugs: Secondary | ICD-10-CM | POA: Diagnosis not present

## 2017-12-15 DIAGNOSIS — Z955 Presence of coronary angioplasty implant and graft: Secondary | ICD-10-CM | POA: Insufficient documentation

## 2017-12-15 DIAGNOSIS — N183 Chronic kidney disease, stage 3 (moderate): Secondary | ICD-10-CM | POA: Diagnosis not present

## 2017-12-15 DIAGNOSIS — I214 Non-ST elevation (NSTEMI) myocardial infarction: Secondary | ICD-10-CM | POA: Diagnosis present

## 2017-12-15 DIAGNOSIS — Z79899 Other long term (current) drug therapy: Secondary | ICD-10-CM | POA: Diagnosis not present

## 2017-12-15 DIAGNOSIS — I129 Hypertensive chronic kidney disease with stage 1 through stage 4 chronic kidney disease, or unspecified chronic kidney disease: Secondary | ICD-10-CM | POA: Insufficient documentation

## 2017-12-15 NOTE — Progress Notes (Signed)
Cardiac Individual Treatment Plan  Patient Details  Name: Randall Callahan MRN: [id number] Date of Birth: April 16, 1928 Referring Provider:     CARDIAC REHAB PHASE II ORIENTATION from 12/15/2017 in [facility]  Referring Provider  Kirk Ruths MD      Initial Encounter Date:    CARDIAC REHAB PHASE II ORIENTATION from 12/15/2017 in [facility]  Date  12/15/17  Referring Provider  Kirk Ruths MD      Visit Diagnosis: NSTEMI (non-ST elevated myocardial infarction) [facility])  Status post coronary artery stent placement  Patient's Home Medications on Admission:  Current Outpatient Medications:  .  aspirin EC 81 MG tablet, Take 81 mg by mouth daily with breakfast. , Disp: , Rfl:  .  atorvastatin (LIPITOR) 40 MG tablet, Take 1 tablet (40 mg total) by mouth at bedtime., Disp: 30 tablet, Rfl: 0 .  bifidobacterium infantis (ALIGN) capsule, Take 1 capsule by mouth daily., Disp: , Rfl:  .  clopidogrel (PLAVIX) 75 MG tablet, Take 1 tablet (75 mg total) by mouth daily with breakfast., Disp: 30 tablet, Rfl: 0 .  isosorbide mononitrate (IMDUR) 60 MG 24 hr tablet, Take 60 mg by mouth as directed. 1/2 tablet by mouth daily, Disp: , Rfl:  .  lisinopril (PRINIVIL,ZESTRIL) 2.5 MG tablet, Take 1 tablet (2.5 mg total) by mouth every evening., Disp: 30 tablet, Rfl: 0 .  metFORMIN (GLUCOPHAGE) 1000 MG tablet, Take 1 tablet (1,000 mg total) by mouth 2 (two) times daily with a meal., Disp: 30 tablet, Rfl: 0 .  metoprolol succinate (TOPROL-XL) 25 MG 24 hr tablet, Take 3 tablets (75 mg total) by mouth daily after breakfast. Take with or immediately following a meal., Disp: 270 tablet, Rfl: 1 .  nitroGLYCERIN (NITROSTAT) 0.4 MG SL tablet, Place 1 tablet (0.4 mg total) under the tongue every 5 (five) minutes as needed for chest pain. Up to 3 doses for chest pain. If pain has not subsided after 3 doses go to the ER., Disp: 30 tablet, Rfl: 1 .  Omega-3  Fatty Acids (FISH OIL PO), Take 300 mg by mouth daily. , Disp: , Rfl:   Past Medical History: Past Medical History:  Diagnosis Date  . Angina pectoris, unstable ([place])   . Angiomyolipoma of kidney   . Arthritis    "hips and knees" (09/29/2017)  . ASCVD (arteriosclerotic cardiovascular disease)   . Bradycardia   . Cancer [facility]) 2002   "iliac crest"  . Cancer of skin, face    "several" (09/29/2017)  . Cardiomyopathy ([place])   . Chest pain   . Chest tightness   . Chronic lower back pain   . CKD (chronic kidney disease), stage III ([place])    [name] 09/29/2017  . Complication of anesthesia    "my urinary tract becomes irritated; takes ~ 3-4 weeks to go away" (09/29/2017)  . Coronary artery disease   . Diverticulosis   . GERD (gastroesophageal reflux disease)   . High cholesterol   . Hx of sick sinus syndrome   . Hypertension   . MCI (mild cognitive impairment)   . MI (myocardial infarction) ([place]) 2003; ~ 12/2016;   . NSTEMI (non-ST elevation myocardial infarction) ([place]) 08/2016   [name] 09/29/2017  . Peripheral neuropathy   . Pneumonia 2000s X ?2  . Presence of permanent cardiac pacemaker 2015  . Schatzki's ring   . Squamous cell carcinoma in situ of skin   . Type II diabetes mellitus ([place])  Tobacco Use: Social History   Tobacco Use  Smoking Status Former Smoker  . Years: 2.00  . Types: Cigarettes  Smokeless Tobacco Never Used  Tobacco Comment   STARTED SMOKING IN 4TH GRADE, QUIT IN 6TH    Labs: Recent Review Flowsheet Data    There is no flowsheet data to display.      Capillary Blood Glucose: Lab Results  Component Value Date   GLUCAP 131 (H) 10/03/2017   GLUCAP 193 (H) 10/02/2017   GLUCAP 145 (H) 10/02/2017   GLUCAP 198 (H) 10/02/2017   GLUCAP 158 (H) 10/02/2017     Exercise Target Goals: Date: 12/15/17  Exercise Program Goal: Individual exercise prescription set using results from initial 6 min walk test and THRR while considering  patient's activity  barriers and safety.   Exercise Prescription Goal: Initial exercise prescription builds to 30-45 minutes a day of aerobic activity, 2-3 days per week.  Home exercise guidelines will be given to patient during program as part of exercise prescription that the participant will acknowledge.  Activity Barriers & Risk Stratification: Activity Barriers & Cardiac Risk Stratification - 12/15/17 0856      Activity Barriers & Cardiac Risk Stratification   Activity Barriers  Left Knee Replacement;Arthritis;Joint Problems;Deconditioning;Muscular Weakness;Other (comment);Back Problems    Comments  R knee pain    Cardiac Risk Stratification  High       6 Minute Walk: 6 Minute Walk    Row Name 12/15/17 0831         6 Minute Walk   Phase  Initial     Distance  679 feet     Walk Time  6 minutes     # of Rest Breaks  0     MPH  1.29     METS  0.76     RPE  10     VO2 Peak  2.65     Symptoms  Yes (comment)     Comments  Legs tired after walk test.     Resting HR  68 bpm     Resting BP  114/53     Resting Oxygen Saturation   98 %     Exercise Oxygen Saturation  during 6 min walk  99 %     Max Ex. HR  81 bpm     Max Ex. BP  116/54     2 Minute Post BP  128/78        Oxygen Initial Assessment:   Oxygen Re-Evaluation:   Oxygen Discharge (Final Oxygen Re-Evaluation):   Initial Exercise Prescription: Initial Exercise Prescription - 12/15/17 1000      Date of Initial Exercise RX and Referring Provider   Date  12/15/17    Referring Provider  Kirk Ruths MD      Recumbant Bike   Level  1    Minutes  10    METs  1.5      NuStep   Level  1    SPM  50    Minutes  10    METs  1.5      Arm Ergometer   Level  1    Minutes  10    METs  1.2      Prescription Details   Frequency (times per week)  3    Duration  Progress to 30 minutes of continuous aerobic without signs/symptoms of physical distress      Intensity   THRR 40-80% of Max Heartrate  53-106  Ratings of  Perceived Exertion  11-13    Perceived Dyspnea  0-4      Progression   Progression  Continue to progress workloads to maintain intensity without signs/symptoms of physical distress.      Resistance Training   Training Prescription  Yes    Weight  1lb    Reps  10-15       Perform Capillary Blood Glucose checks as needed.  Exercise Prescription Changes:   Exercise Comments:   Exercise Goals and Review: Exercise Goals    Row Name 12/15/17 0856 12/15/17 0859           Exercise Goals   Increase Physical Activity  Yes  -      Intervention  Provide advice, education, support and counseling about physical activity/exercise needs.;Develop an individualized exercise prescription for aerobic and resistive training based on initial evaluation findings, risk stratification, comorbidities and participant's personal goals.  -      Expected Outcomes  Short Term: Attend rehab on a regular basis to increase amount of physical activity.;Long Term: Exercising regularly at least 3-5 days a week.;Long Term: Add in home exercise to make exercise part of routine and to increase amount of physical activity.  -      Increase Strength and Stamina  Yes  - return to playing tennis, gardening home activities      Intervention  Provide advice, education, support and counseling about physical activity/exercise needs.;Develop an individualized exercise prescription for aerobic and resistive training based on initial evaluation findings, risk stratification, comorbidities and participant's personal goals.  -      Expected Outcomes  Short Term: Increase workloads from initial exercise prescription for resistance, speed, and METs.;Long Term: Improve cardiorespiratory fitness, muscular endurance and strength as measured by increased METs and functional capacity (6MWT);Short Term: Perform resistance training exercises routinely during rehab and add in resistance training at home  -      Able to understand and use rate  of perceived exertion (RPE) scale  Yes  -      Intervention  Provide education and explanation on how to use RPE scale  -      Expected Outcomes  Short Term: Able to use RPE daily in rehab to express subjective intensity level;Long Term:  Able to use RPE to guide intensity level when exercising independently  -      Knowledge and understanding of Target Heart Rate Range (THRR)  Yes  -      Intervention  Provide education and explanation of THRR including how the numbers were predicted and where they are located for reference  -      Expected Outcomes  Short Term: Able to state/look up THRR;Long Term: Able to use THRR to govern intensity when exercising independently;Short Term: Able to use daily as guideline for intensity in rehab  -      Able to check pulse independently  Yes  -      Intervention  Provide education and demonstration on how to check pulse in carotid and radial arteries.;Review the importance of being able to check your own pulse for safety during independent exercise  -      Expected Outcomes  Short Term: Able to explain why pulse checking is important during independent exercise;Long Term: Able to check pulse independently and accurately  -      Understanding of Exercise Prescription  Yes  -      Intervention  Provide education, explanation, and written materials on patient's individual  exercise prescription  -      Expected Outcomes  Short Term: Able to explain program exercise prescription;Long Term: Able to explain home exercise prescription to exercise independently  -         Exercise Goals Re-Evaluation :    Discharge Exercise Prescription (Final Exercise Prescription Changes):   Nutrition:  Target Goals: Understanding of nutrition guidelines, daily intake of sodium 1500mg , cholesterol 200mg , calories 30% from fat and 7% or less from saturated fats, daily to have 5 or more servings of fruits and vegetables.  Biometrics: Pre Biometrics - 12/15/17 1042      Pre  Biometrics   Height  6' 0.25" (1.835 m)    Weight  197 lb 12 oz (89.7 kg)    Waist Circumference  40.75 inches    Hip Circumference  41.5 inches    Waist to Hip Ratio  0.98 %    BMI (Calculated)  26.64    Triceps Skinfold  12 mm    % Body Fat  26.9 %    Grip Strength  39 kg    Flexibility  13 in    Single Leg Stand  9 seconds        Nutrition Therapy Plan and Nutrition Goals:   Nutrition Assessments:   Nutrition Goals Re-Evaluation:   Nutrition Goals Re-Evaluation:   Nutrition Goals Discharge (Final Nutrition Goals Re-Evaluation):   Psychosocial: Target Goals: Acknowledge presence or absence of significant depression and/or stress, maximize coping skills, provide positive support system. Participant is able to verbalize types and ability to use techniques and skills needed for reducing stress and depression.  Initial Review & Psychosocial Screening: Initial Psych Review & Screening - 12/15/17 1240      Initial Review   Current issues with  None Identified      Family Dynamics   Good Support System?  Yes daughter accompanies pt to cardiac rehab and is involved in his care      Barriers   Psychosocial barriers to participate in program  There are no identifiable barriers or psychosocial needs.;The patient should benefit from training in stress management and relaxation.      Screening Interventions   Interventions  Encouraged to exercise    Expected Outcomes  Short Term goal: Identification and review with participant of any Quality of Life or Depression concerns found by scoring the questionnaire.;Long Term goal: The participant improves quality of Life and PHQ9 Scores as seen by post scores and/or verbalization of changes       Quality of Life Scores: Quality of Life - 12/15/17 0925      Quality of Life Scores   Health/Function Pre  18.07 %    Socioeconomic Pre  22 %    Psych/Spiritual Pre  19.08 %    Family Pre  25.5 %    GLOBAL Pre  20.05 %      Scores  of 19 and below usually indicate a poorer quality of life in these areas.  A difference of  2-3 points is a clinically meaningful difference.  A difference of 2-3 points in the total score of the Quality of Life Index has been associated with significant improvement in overall quality of life, self-image, physical symptoms, and general health in studies assessing change in quality of life.  PHQ-9: Recent Review Flowsheet Data    There is no flowsheet data to display.     Interpretation of Total Score  Total Score Depression Severity:  1-4 = Minimal depression, 5-9 =  Mild depression, 10-14 = Moderate depression, 15-19 = Moderately severe depression, 20-27 = Severe depression   Psychosocial Evaluation and Intervention:   Psychosocial Re-Evaluation:   Psychosocial Discharge (Final Psychosocial Re-Evaluation):   Vocational Rehabilitation: Provide vocational rehab assistance to qualifying candidates.   Vocational Rehab Evaluation & Intervention: Vocational Rehab - 12/15/17 1240      Initial Vocational Rehab Evaluation & Intervention   Assessment shows need for Vocational Rehabilitation  No Pt is retired.       Education: Education Goals: Education classes will be provided on a weekly basis, covering required topics. Participant will state understanding/return demonstration of topics presented.  Learning Barriers/Preferences: Learning Barriers/Preferences - 12/15/17 0855      Learning Barriers/Preferences   Learning Barriers  Sight    Learning Preferences  Skilled Demonstration       Education Topics: Count Your Pulse:  -Group instruction provided by verbal instruction, demonstration, patient participation and written materials to support subject.  Instructors address importance of being able to find your pulse and how to count your pulse when at home without a heart monitor.  Patients get hands on experience counting their pulse with staff help and individually.   Heart  Attack, Angina, and Risk Factor Modification:  -Group instruction provided by verbal instruction, video, and written materials to support subject.  Instructors address signs and symptoms of angina and heart attacks.    Also discuss risk factors for heart disease and how to make changes to improve heart health risk factors.   Functional Fitness:  -Group instruction provided by verbal instruction, demonstration, patient participation, and written materials to support subject.  Instructors address safety measures for doing things around the house.  Discuss how to get up and down off the floor, how to pick things up properly, how to safely get out of a chair without assistance, and balance training.   Meditation and Mindfulness:  -Group instruction provided by verbal instruction, patient participation, and written materials to support subject.  Instructor addresses importance of mindfulness and meditation practice to help reduce stress and improve awareness.  Instructor also leads participants through a meditation exercise.    Stretching for Flexibility and Mobility:  -Group instruction provided by verbal instruction, patient participation, and written materials to support subject.  Instructors lead participants through series of stretches that are designed to increase flexibility thus improving mobility.  These stretches are additional exercise for major muscle groups that are typically performed during regular warm up and cool down.   Hands Only CPR:  -Group verbal, video, and participation provides a basic overview of AHA guidelines for community CPR. Role-play of emergencies allow participants the opportunity to practice calling for help and chest compression technique with discussion of AED use.   Hypertension: -Group verbal and written instruction that provides a basic overview of hypertension including the most recent diagnostic guidelines, risk factor reduction with self-care instructions  and medication management.    Nutrition I class: Heart Healthy Eating:  -Group instruction provided by PowerPoint slides, verbal discussion, and written materials to support subject matter. The instructor gives an explanation and review of the Therapeutic Lifestyle Changes diet recommendations, which includes a discussion on lipid goals, dietary fat, sodium, fiber, plant stanol/sterol esters, sugar, and the components of a well-balanced, healthy diet.   Nutrition II class: Lifestyle Skills:  -Group instruction provided by PowerPoint slides, verbal discussion, and written materials to support subject matter. The instructor gives an explanation and review of label reading, grocery shopping for heart health, heart healthy  recipe modifications, and ways to make healthier choices when eating out.   Diabetes Question & Answer:  -Group instruction provided by PowerPoint slides, verbal discussion, and written materials to support subject matter. The instructor gives an explanation and review of diabetes co-morbidities, pre- and post-prandial blood glucose goals, pre-exercise blood glucose goals, signs, symptoms, and treatment of hypoglycemia and hyperglycemia, and foot care basics.   Diabetes Blitz:  -Group instruction provided by PowerPoint slides, verbal discussion, and written materials to support subject matter. The instructor gives an explanation and review of the physiology behind type 1 and type 2 diabetes, diabetes medications and rational behind using different medications, pre- and post-prandial blood glucose recommendations and Hemoglobin A1c goals, diabetes diet, and exercise including blood glucose guidelines for exercising safely.    Portion Distortion:  -Group instruction provided by PowerPoint slides, verbal discussion, written materials, and food models to support subject matter. The instructor gives an explanation of serving size versus portion size, changes in portions sizes over the  last 20 years, and what consists of a serving from each food group.   Stress Management:  -Group instruction provided by verbal instruction, video, and written materials to support subject matter.  Instructors review role of stress in heart disease and how to cope with stress positively.     Exercising on Your Own:  -Group instruction provided by verbal instruction, power point, and written materials to support subject.  Instructors discuss benefits of exercise, components of exercise, frequency and intensity of exercise, and end points for exercise.  Also discuss use of nitroglycerin and activating EMS.  Review options of places to exercise outside of rehab.  Review guidelines for sex with heart disease.   Cardiac Drugs I:  -Group instruction provided by verbal instruction and written materials to support subject.  Instructor reviews cardiac drug classes: antiplatelets, anticoagulants, beta blockers, and statins.  Instructor discusses reasons, side effects, and lifestyle considerations for each drug class.   Cardiac Drugs II:  -Group instruction provided by verbal instruction and written materials to support subject.  Instructor reviews cardiac drug classes: angiotensin converting enzyme inhibitors (ACE-I), angiotensin II receptor blockers (ARBs), nitrates, and calcium channel blockers.  Instructor discusses reasons, side effects, and lifestyle considerations for each drug class.   Anatomy and Physiology of the Circulatory System:  Group verbal and written instruction and models provide basic cardiac anatomy and physiology, with the coronary electrical and arterial systems. Review of: AMI, Angina, Valve disease, Heart Failure, Peripheral Artery Disease, Cardiac Arrhythmia, Pacemakers, and the ICD.   Other Education:  -Group or individual verbal, written, or video instructions that support the educational goals of the cardiac rehab program.   Holiday Eating Survival Tips:  -Group  instruction provided by PowerPoint slides, verbal discussion, and written materials to support subject matter. The instructor gives patients tips, tricks, and techniques to help them not only survive but enjoy the holidays despite the onslaught of food that accompanies the holidays.   Knowledge Questionnaire Score: Knowledge Questionnaire Score - 12/15/17 0925      Knowledge Questionnaire Score   Pre Score  18/24       Core Components/Risk Factors/Patient Goals at Admission: Personal Goals and Risk Factors at Admission - 12/15/17 1045      Core Components/Risk Factors/Patient Goals on Admission   Hypertension  Yes pt has h/o HTN; recent BP readings are low (hypotensive)    Intervention  Provide education on lifestyle modifcations including regular physical activity/exercise, weight management, moderate sodium restriction and increased consumption of  fresh fruit, vegetables, and low fat dairy, alcohol moderation, and smoking cessation.;Monitor prescription use compliance.    Expected Outcomes  Short Term: Continued assessment and intervention until BP is < 140/73mm HG in hypertensive participants. < 130/25mm HG in hypertensive participants with diabetes, heart failure or chronic kidney disease.;Long Term: Maintenance of blood pressure at goal levels.       Core Components/Risk Factors/Patient Goals Review:    Core Components/Risk Factors/Patient Goals at Discharge (Final Review):    ITP Comments: ITP Comments    Row Name 12/15/17 0853           ITP Comments  Dr. Fransico Him, Medical Director          Comments:  Patient attended orientation from [phone] to 1002to review rules and guidelines for program.Pt is accompanied by his daughter in law. Completed 6 minute walk test, Intitial ITP, and exercise prescription.  VSS. Telemetry- VPaced.  Pt tolerated well and used the rolater. Pt thought he was able to go faster.  Pt ambulated 379 feet. Brief Psychosocial Assessment reveal no  immediate barriers to participating in cardiac rehab.  Pt is looking forward to Cardiac Rehab. Cherre Huger, BSN Cardiac and Training and development officer

## 2017-12-15 NOTE — Progress Notes (Signed)
Randall Callahan 84 y.o. male DOB: 09/06/1928 MRN: [id number]      Nutrition Note  1. NSTEMI (non-ST elevated myocardial infarction) ([place])   2. Status post coronary artery stent placement    Past Medical History:  Diagnosis Date  . Angina pectoris, unstable ([place])   . Angiomyolipoma of kidney   . Arthritis    "hips and knees" (09/29/2017)  . ASCVD (arteriosclerotic cardiovascular disease)   . Bradycardia   . Cancer [facility]) 2002   "iliac crest"  . Cancer of skin, face    "several" (09/29/2017)  . Cardiomyopathy ([place])   . Chest pain   . Chest tightness   . Chronic lower back pain   . CKD (chronic kidney disease), stage III ([place])    Randall Callahan 09/29/2017  . Complication of anesthesia    "my urinary tract becomes irritated; takes ~ 3-4 weeks to go away" (09/29/2017)  . Coronary artery disease   . Diverticulosis   . GERD (gastroesophageal reflux disease)   . High cholesterol   . Hx of sick sinus syndrome   . Hypertension   . MCI (mild cognitive impairment)   . MI (myocardial infarction) ([place]) 2003; ~ 12/2016;   . NSTEMI (non-ST elevation myocardial infarction) ([place]) 08/2016   Randall Callahan 09/29/2017  . Peripheral neuropathy   . Pneumonia 2000s X ?2  . Presence of permanent cardiac pacemaker 2015  . Schatzki's ring   . Squamous cell carcinoma in situ of skin   . Type II diabetes mellitus ([place])    Meds reviewed. Metformin noted  HT: Ht Readings from Last 1 Encounters:  12/15/17 6' 0.25" (1.835 m)    WT: Wt Readings from Last 3 Encounters:  12/15/17 197 lb 12 oz (89.7 kg)  10/22/17 200 lb 9.6 oz (91 kg)  10/03/17 203 lb 4.2 oz (92.2 kg)     BMI 26.6   Current tobacco use? No   Labs:  Lipid Panel             05/12/17 Total cholesterol  116  Triglycerides   109 HDL      41 LDL       53  Hemoglobin A1c  6.4 05/12/17  Nutrition Note Spoke with pt and pt's daughter-in-law. 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 Nutrition plan and goals reviewed with pt. Pt is following Step 1  of the Therapeutic Lifestyle Changes diet. Pt is diabetic. Last A1c indicates blood glucose well-controlled. Pt checks CBG's 1 time a day. Fasting CBG's reportedly 111-120 mg/dL. Age-appropriate nutrition recommendations discussed. Pt expressed understanding of the information reviewed. Pt aware of nutrition education classes offered.  Nutrition Diagnosis ? Food-and nutrition-related knowledge deficit related to lack of exposure to information as related to diagnosis of: ? CVD ? DM   Nutrition Intervention ? Pt's individual nutrition plan and goals reviewed with pt.  Nutrition Goal(s):  ? Pt to describe the benefit of including fruits, vegetables, whole grains, and low-fat dairy products in a heart healthy meal plan.  Plan:  Pt to attend nutrition classes ? Nutrition I ? Nutrition II ? Portion Distortion ? Diabetes Blitz ? Diabetes Q & A Will provide client-centered nutrition education as part of interdisciplinary care.   Monitor and evaluate progress toward nutrition goal with team.  Derek Mound, M.Ed, RD, LDN, CDE 12/15/2017 3:29 PM

## 2017-12-17 ENCOUNTER — Other Ambulatory Visit: Payer: Self-pay | Admitting: Cardiology

## 2017-12-18 NOTE — Telephone Encounter (Signed)
Rx(s) sent to pharmacy electronically.  

## 2017-12-18 NOTE — Telephone Encounter (Signed)
This is Dr. Crenshaw's pt 

## 2017-12-21 ENCOUNTER — Encounter (HOSPITAL_COMMUNITY): Payer: Self-pay

## 2017-12-21 ENCOUNTER — Encounter (HOSPITAL_COMMUNITY)
Admission: RE | Admit: 2017-12-21 | Discharge: 2017-12-21 | Disposition: A | Discharge status: Home / Self Care | Payer: Medicare Other | Source: Ambulatory Visit | Attending: Cardiology | Admitting: Cardiology

## 2017-12-21 DIAGNOSIS — I214 Non-ST elevation (NSTEMI) myocardial infarction: Secondary | ICD-10-CM | POA: Diagnosis not present

## 2017-12-21 DIAGNOSIS — Z955 Presence of coronary angioplasty implant and graft: Secondary | ICD-10-CM

## 2017-12-21 LAB — GLUCOSE, CAPILLARY
GLUCOSE-CAPILLARY: 103 mg/dL — AB (ref 65–99)
Glucose-Capillary: 105 mg/dL — ABNORMAL HIGH (ref 65–99)

## 2017-12-21 NOTE — Progress Notes (Signed)
Daily Session Note  Patient Details  Name: Javaun Dimperio MRN: [id number] Date of Birth: February 12, 1928 Referring Provider:     CARDIAC REHAB PHASE II ORIENTATION from 12/15/2017 in [place]  Referring Provider  Kirk Ruths MD      Encounter Date: 12/21/2017  Check In: Session Check In - 12/21/17 0841      Check-In   Location  [facility]    Staff Present  Dorna Bloom, MS, ACSM RCEP, Exercise Physiologist;Molly diVincenzo, MS, ACSM RCEP, Exercise Physiologist;Tyara Nevels, MS,ACSM CEP, Exercise Physiologist;Joann Rion, RN, BSN    Supervising physician immediately available to respond to emergencies  [facility] immediately available    Physician(s)  Dr. Horris Latino    Medication changes reported      No    Fall or balance concerns reported     No    Tobacco Cessation  No Change    Warm-up and Cool-down  Performed as group-led instruction    Resistance Training Performed  Yes    VAD Patient?  No      Pain Assessment   Currently in Pain?  No/denies    Multiple Pain Sites  No       Capillary Blood Glucose: Results for orders placed or performed during the hospital encounter of 12/21/17 (from the past 24 hour(s))  Glucose, capillary     Status: Abnormal   Collection Time: 12/21/17  8:27 AM  Result Value Ref Range   Glucose-Capillary 103 (H) 65 - 99 mg/dL      Social History   Tobacco Use  Smoking Status Former Smoker  . Years: 2.00  . Types: Cigarettes  Smokeless Tobacco Never Used  Tobacco Comment   STARTED SMOKING IN 4TH GRADE, QUIT IN 6TH    Goals Met:  Exercise tolerated well  Goals Unmet:  Not Applicable  Comments: Pt started cardiac rehab today.  Pt tolerated light exercise without difficulty. VSS, telemetry-paced,  asymptomatic.  Medication list reconciled. Pt denies barriers to medicaiton compliance.  PSYCHOSOCIAL ASSESSMENT:  PHQ-0. [name] Pt exhibits positive coping skills, hopeful outlook with  supportive family. No psychosocial needs identified at this time, no psychosocial interventions necessary.  Pt oriented to exercise equipment and routine.    Understanding verbalized.   Dr. Fransico Him is Medical Director for Cardiac Rehab at [facility].

## 2017-12-22 DIAGNOSIS — Z79899 Other long term (current) drug therapy: Secondary | ICD-10-CM | POA: Diagnosis not present

## 2017-12-22 DIAGNOSIS — Z85820 Personal history of malignant melanoma of skin: Secondary | ICD-10-CM | POA: Diagnosis not present

## 2017-12-22 DIAGNOSIS — Z7984 Long term (current) use of oral hypoglycemic drugs: Secondary | ICD-10-CM | POA: Diagnosis not present

## 2017-12-22 DIAGNOSIS — I214 Non-ST elevation (NSTEMI) myocardial infarction: Secondary | ICD-10-CM | POA: Diagnosis present

## 2017-12-22 DIAGNOSIS — Z87891 Personal history of nicotine dependence: Secondary | ICD-10-CM | POA: Diagnosis not present

## 2017-12-22 DIAGNOSIS — I129 Hypertensive chronic kidney disease with stage 1 through stage 4 chronic kidney disease, or unspecified chronic kidney disease: Secondary | ICD-10-CM | POA: Diagnosis not present

## 2017-12-22 DIAGNOSIS — E1122 Type 2 diabetes mellitus with diabetic chronic kidney disease: Secondary | ICD-10-CM | POA: Diagnosis not present

## 2017-12-22 DIAGNOSIS — N183 Chronic kidney disease, stage 3 (moderate): Secondary | ICD-10-CM | POA: Diagnosis not present

## 2017-12-22 DIAGNOSIS — Z7982 Long term (current) use of aspirin: Secondary | ICD-10-CM | POA: Diagnosis not present

## 2017-12-22 DIAGNOSIS — Z955 Presence of coronary angioplasty implant and graft: Secondary | ICD-10-CM | POA: Diagnosis present

## 2017-12-23 ENCOUNTER — Encounter (HOSPITAL_COMMUNITY)
Admission: RE | Admit: 2017-12-23 | Discharge: 2017-12-23 | Disposition: A | Discharge status: Home / Self Care | Payer: Medicare Other | Source: Ambulatory Visit | Attending: Cardiology | Admitting: Cardiology

## 2017-12-23 DIAGNOSIS — I214 Non-ST elevation (NSTEMI) myocardial infarction: Secondary | ICD-10-CM

## 2017-12-23 DIAGNOSIS — Z955 Presence of coronary angioplasty implant and graft: Secondary | ICD-10-CM

## 2017-12-23 LAB — GLUCOSE, CAPILLARY
GLUCOSE-CAPILLARY: 215 mg/dL — AB (ref 65–99)
GLUCOSE-CAPILLARY: 155 mg/dL — AB (ref 65–99)

## 2017-12-25 ENCOUNTER — Encounter (HOSPITAL_COMMUNITY): Payer: Self-pay

## 2017-12-25 ENCOUNTER — Inpatient Hospital Stay (HOSPITAL_COMMUNITY)
Admission: EM | Admit: 2017-12-25 | Discharge: 2017-12-30 | DRG: 303 | Disposition: A | Discharge status: Home / Self Care | Payer: Medicare Other | Attending: Interventional Cardiology | Admitting: Interventional Cardiology

## 2017-12-25 ENCOUNTER — Other Ambulatory Visit: Payer: Self-pay

## 2017-12-25 ENCOUNTER — Emergency Department (HOSPITAL_COMMUNITY): Payer: Medicare Other

## 2017-12-25 ENCOUNTER — Encounter (HOSPITAL_COMMUNITY): Payer: Medicare Other

## 2017-12-25 DIAGNOSIS — E119 Type 2 diabetes mellitus without complications: Secondary | ICD-10-CM | POA: Diagnosis not present

## 2017-12-25 DIAGNOSIS — Z85828 Personal history of other malignant neoplasm of skin: Secondary | ICD-10-CM | POA: Diagnosis not present

## 2017-12-25 DIAGNOSIS — I25110 Atherosclerotic heart disease of native coronary artery with unstable angina pectoris: Secondary | ICD-10-CM | POA: Diagnosis present

## 2017-12-25 DIAGNOSIS — Z9841 Cataract extraction status, right eye: Secondary | ICD-10-CM

## 2017-12-25 DIAGNOSIS — E1142 Type 2 diabetes mellitus with diabetic polyneuropathy: Secondary | ICD-10-CM | POA: Diagnosis present

## 2017-12-25 DIAGNOSIS — E7800 Pure hypercholesterolemia, unspecified: Secondary | ICD-10-CM | POA: Diagnosis present

## 2017-12-25 DIAGNOSIS — N183 Chronic kidney disease, stage 3 unspecified: Secondary | ICD-10-CM | POA: Diagnosis present

## 2017-12-25 DIAGNOSIS — G3184 Mild cognitive impairment, so stated: Secondary | ICD-10-CM | POA: Diagnosis present

## 2017-12-25 DIAGNOSIS — I129 Hypertensive chronic kidney disease with stage 1 through stage 4 chronic kidney disease, or unspecified chronic kidney disease: Secondary | ICD-10-CM | POA: Diagnosis present

## 2017-12-25 DIAGNOSIS — I252 Old myocardial infarction: Secondary | ICD-10-CM

## 2017-12-25 DIAGNOSIS — E1122 Type 2 diabetes mellitus with diabetic chronic kidney disease: Secondary | ICD-10-CM | POA: Diagnosis present

## 2017-12-25 DIAGNOSIS — Z961 Presence of intraocular lens: Secondary | ICD-10-CM | POA: Diagnosis present

## 2017-12-25 DIAGNOSIS — R000 Tachycardia, unspecified: Secondary | ICD-10-CM | POA: Diagnosis not present

## 2017-12-25 DIAGNOSIS — Z7982 Long term (current) use of aspirin: Secondary | ICD-10-CM | POA: Diagnosis not present

## 2017-12-25 DIAGNOSIS — I493 Ventricular premature depolarization: Secondary | ICD-10-CM | POA: Diagnosis present

## 2017-12-25 DIAGNOSIS — Z955 Presence of coronary angioplasty implant and graft: Secondary | ICD-10-CM

## 2017-12-25 DIAGNOSIS — I951 Orthostatic hypotension: Secondary | ICD-10-CM | POA: Diagnosis present

## 2017-12-25 DIAGNOSIS — I255 Ischemic cardiomyopathy: Principal | ICD-10-CM

## 2017-12-25 DIAGNOSIS — I2582 Chronic total occlusion of coronary artery: Secondary | ICD-10-CM | POA: Diagnosis present

## 2017-12-25 DIAGNOSIS — Z7984 Long term (current) use of oral hypoglycemic drugs: Secondary | ICD-10-CM | POA: Diagnosis not present

## 2017-12-25 DIAGNOSIS — Z9842 Cataract extraction status, left eye: Secondary | ICD-10-CM

## 2017-12-25 DIAGNOSIS — I200 Unstable angina: Secondary | ICD-10-CM

## 2017-12-25 DIAGNOSIS — Z950 Presence of cardiac pacemaker: Secondary | ICD-10-CM | POA: Diagnosis not present

## 2017-12-25 DIAGNOSIS — Z87891 Personal history of nicotine dependence: Secondary | ICD-10-CM | POA: Diagnosis not present

## 2017-12-25 DIAGNOSIS — Z96652 Presence of left artificial knee joint: Secondary | ICD-10-CM | POA: Diagnosis present

## 2017-12-25 DIAGNOSIS — D049 Carcinoma in situ of skin, unspecified: Secondary | ICD-10-CM | POA: Diagnosis present

## 2017-12-25 DIAGNOSIS — K219 Gastro-esophageal reflux disease without esophagitis: Secondary | ICD-10-CM | POA: Diagnosis present

## 2017-12-25 DIAGNOSIS — M160 Bilateral primary osteoarthritis of hip: Secondary | ICD-10-CM | POA: Diagnosis present

## 2017-12-25 DIAGNOSIS — R54 Age-related physical debility: Secondary | ICD-10-CM | POA: Diagnosis present

## 2017-12-25 DIAGNOSIS — Z7902 Long term (current) use of antithrombotics/antiplatelets: Secondary | ICD-10-CM

## 2017-12-25 DIAGNOSIS — Z79899 Other long term (current) drug therapy: Secondary | ICD-10-CM

## 2017-12-25 DIAGNOSIS — E785 Hyperlipidemia, unspecified: Secondary | ICD-10-CM | POA: Diagnosis present

## 2017-12-25 DIAGNOSIS — I10 Essential (primary) hypertension: Secondary | ICD-10-CM | POA: Diagnosis present

## 2017-12-25 LAB — GLUCOSE, CAPILLARY
GLUCOSE-CAPILLARY: 107 mg/dL — AB (ref 65–99)
GLUCOSE-CAPILLARY: 93 mg/dL (ref 65–99)
Glucose-Capillary: 115 mg/dL — ABNORMAL HIGH (ref 65–99)

## 2017-12-25 LAB — I-STAT TROPONIN, ED: TROPONIN I, POC: 0.04 ng/mL (ref 0.00–0.08)

## 2017-12-25 LAB — BASIC METABOLIC PANEL
ANION GAP: 12 (ref 5–15)
BUN: 13 mg/dL (ref 6–20)
CALCIUM: 9.4 mg/dL (ref 8.9–10.3)
CO2: 21 mmol/L — AB (ref 22–32)
Chloride: 104 mmol/L (ref 101–111)
Creatinine, Ser: 1.03 mg/dL (ref 0.61–1.24)
GFR calc Af Amer: >60 mL/min (ref >60)
GFR calc non Af Amer: >60 mL/min (ref >60)
Glucose, Bld: 195 mg/dL — ABNORMAL HIGH (ref 65–99)
Potassium: 4.2 mmol/L (ref 3.5–5.1)
Sodium: 137 mmol/L (ref 135–145)

## 2017-12-25 LAB — URINALYSIS, ROUTINE W REFLEX MICROSCOPIC
BILIRUBIN URINE: NEGATIVE
Glucose, UA: NEGATIVE mg/dL
Hgb urine dipstick: NEGATIVE
Ketones, ur: NEGATIVE mg/dL
LEUKOCYTES UA: NEGATIVE
NITRITE: NEGATIVE
PH: 5 (ref 5.0–8.0)
Protein, ur: NEGATIVE mg/dL
SPECIFIC GRAVITY, URINE: 1.009 (ref 1.005–1.030)

## 2017-12-25 LAB — HEMOGLOBIN A1C
Hgb A1c MFr Bld: 6.9 % — ABNORMAL HIGH (ref 4.8–5.6)
Mean Plasma Glucose: 151.33 mg/dL

## 2017-12-25 LAB — TROPONIN I
Troponin I: 0.17 ng/mL (ref <0.03)
Troponin I: 0.14 ng/mL (ref <0.03)

## 2017-12-25 LAB — CBC
HCT: 43 % (ref 39.0–52.0)
HEMOGLOBIN: 14.5 g/dL (ref 13.0–17.0)
MCH: 29.4 pg (ref 26.0–34.0)
MCHC: 33.7 g/dL (ref 30.0–36.0)
MCV: 87.2 fL (ref 78.0–100.0)
Platelets: 236 10*3/uL (ref 150–400)
RBC: 4.93 MIL/uL (ref 4.22–5.81)
RDW: 15.6 % — ABNORMAL HIGH (ref 11.5–15.5)
WBC: 9.8 10*3/uL (ref 4.0–10.5)

## 2017-12-25 LAB — BRAIN NATRIURETIC PEPTIDE: B Natriuretic Peptide: 421.3 pg/mL — ABNORMAL HIGH (ref 0.0–100.0)

## 2017-12-25 MED ORDER — CLOPIDOGREL BISULFATE 75 MG PO TABS
75.0000 mg | ORAL_TABLET | Freq: Every day | ORAL | Status: DC
  Administered 2017-12-26 – 2017-12-30 (×5): 75 mg via ORAL
  Filled 2017-12-25 (×5): qty 1

## 2017-12-25 MED ORDER — BENEFIBER PO POWD: 15.0000 mL | Freq: Every day | ORAL | Status: DC | PRN

## 2017-12-25 MED ORDER — RANOLAZINE ER 500 MG PO TB12
500.0000 mg | ORAL_TABLET | Freq: Two times a day (BID) | ORAL | Status: DC
  Administered 2017-12-25 – 2017-12-29 (×9): 500 mg via ORAL
  Filled 2017-12-25 (×9): qty 1

## 2017-12-25 MED ORDER — HEPARIN (PORCINE) IN NACL 100-0.45 UNIT/ML-% IJ SOLN
1400.0000 [IU]/h | INTRAMUSCULAR | Status: DC
  Administered 2017-12-25 – 2017-12-27 (×3): 1250 [IU]/h via INTRAVENOUS
  Filled 2017-12-25 (×3): qty 250

## 2017-12-25 MED ORDER — ISOSORBIDE MONONITRATE ER 30 MG PO TB24
30.0000 mg | ORAL_TABLET | Freq: Every day | ORAL | Status: DC
  Administered 2017-12-26 – 2017-12-30 (×5): 30 mg via ORAL
  Filled 2017-12-25 (×5): qty 1

## 2017-12-25 MED ORDER — ATORVASTATIN CALCIUM 40 MG PO TABS
40.0000 mg | ORAL_TABLET | Freq: Every day | ORAL | Status: DC
  Administered 2017-12-25 – 2017-12-29 (×5): 40 mg via ORAL
  Filled 2017-12-25 (×5): qty 1

## 2017-12-25 MED ORDER — METOPROLOL SUCCINATE ER 50 MG PO TB24
75.0000 mg | ORAL_TABLET | Freq: Every day | ORAL | Status: DC
  Administered 2017-12-26 – 2017-12-30 (×5): 75 mg via ORAL
  Filled 2017-12-25 (×5): qty 1

## 2017-12-25 MED ORDER — NITROGLYCERIN 0.4 MG SL SUBL: 0.4000 mg | SUBLINGUAL_TABLET | SUBLINGUAL | Status: DC | PRN

## 2017-12-25 MED ORDER — ACETAMINOPHEN 325 MG PO TABS
650.0000 mg | ORAL_TABLET | ORAL | Status: DC | PRN
Start: 2017-12-25 — End: 2017-12-30

## 2017-12-25 MED ORDER — ALPRAZOLAM 0.25 MG PO TABS: 0.2500 mg | ORAL_TABLET | Freq: Two times a day (BID) | ORAL | Status: DC | PRN

## 2017-12-25 MED ORDER — HYPROMELLOSE (GONIOSCOPIC) 2.5 % OP SOLN: 2.0000 [drp] | OPHTHALMIC | Status: DC | PRN

## 2017-12-25 MED ORDER — ONDANSETRON HCL 4 MG/2ML IJ SOLN: 4.0000 mg | Freq: Four times a day (QID) | INTRAMUSCULAR | Status: DC | PRN

## 2017-12-25 MED ORDER — INSULIN ASPART 100 UNIT/ML ~~LOC~~ SOLN: 0.0000 [IU] | Freq: Every day | SUBCUTANEOUS | Status: DC

## 2017-12-25 MED ORDER — HEPARIN BOLUS VIA INFUSION
4000.0000 [IU] | Freq: Once | INTRAVENOUS | Status: AC
  Administered 2017-12-25: 4000 [IU] via INTRAVENOUS
  Filled 2017-12-25: qty 4000

## 2017-12-25 MED ORDER — ASPIRIN EC 81 MG PO TBEC
81.0000 mg | DELAYED_RELEASE_TABLET | Freq: Every day | ORAL | Status: DC
  Administered 2017-12-26 – 2017-12-30 (×5): 81 mg via ORAL
  Filled 2017-12-25 (×5): qty 1

## 2017-12-25 MED ORDER — INSULIN ASPART 100 UNIT/ML ~~LOC~~ SOLN
0.0000 [IU] | Freq: Three times a day (TID) | SUBCUTANEOUS | Status: DC
  Administered 2017-12-26 (×2): 2 [IU] via SUBCUTANEOUS
  Administered 2017-12-27: 3 [IU] via SUBCUTANEOUS
  Administered 2017-12-27: 5 [IU] via SUBCUTANEOUS
  Administered 2017-12-27 – 2017-12-28 (×2): 2 [IU] via SUBCUTANEOUS
  Administered 2017-12-28 – 2017-12-29 (×2): 3 [IU] via SUBCUTANEOUS
  Administered 2017-12-29 – 2017-12-30 (×2): 2 [IU] via SUBCUTANEOUS
  Administered 2017-12-30: 3 [IU] via SUBCUTANEOUS

## 2017-12-25 MED ORDER — ASPIRIN 81 MG PO CHEW
324.0000 mg | CHEWABLE_TABLET | Freq: Once | ORAL | Status: AC
  Administered 2017-12-25: 324 mg via ORAL
  Filled 2017-12-25: qty 4

## 2017-12-25 MED ORDER — ZOLPIDEM TARTRATE 5 MG PO TABS: 5.0000 mg | ORAL_TABLET | Freq: Every evening | ORAL | Status: DC | PRN

## 2017-12-25 MED ORDER — LISINOPRIL 2.5 MG PO TABS
2.5000 mg | ORAL_TABLET | Freq: Every evening | ORAL | Status: DC
  Administered 2017-12-25 – 2017-12-29 (×5): 2.5 mg via ORAL
  Filled 2017-12-25 (×5): qty 1

## 2017-12-25 MED ORDER — OMEGA-3-ACID ETHYL ESTERS 1 G PO CAPS
1.0000 g | ORAL_CAPSULE | Freq: Every day | ORAL | Status: DC
  Administered 2017-12-25 – 2017-12-30 (×6): 1 g via ORAL
  Filled 2017-12-25 (×6): qty 1

## 2017-12-25 MED ORDER — ALIGN PO CAPS: 1.0000 | ORAL_CAPSULE | Freq: Every day | ORAL | Status: DC

## 2017-12-25 NOTE — Progress Notes (Signed)
CRITICAL VALUE ALERT  Critical Value: Troponin .17  Date & Time Notied: 1715  Provider Notified: PA Dunn  Orders Received/Actions taken: Continue with heparin gtt; monitor patient for pain; and continue trops q6.  Saddie Benders RN

## 2017-12-25 NOTE — Progress Notes (Signed)
ANTICOAGULATION CONSULT NOTE - Follow Up Consult  Pharmacy Consult for Heparin Indication: chest pain/ACS  No Known Allergies  Patient Measurements: Height: 6' (182.9 cm) Weight: 198 lb 6.6 oz (90 kg) IBW/kg (Calculated) : 77.6 Heparin Dosing Weight: 90 kg  Vital Signs: Temp: 97.9 F (36.6 C) (03/01 1454) Temp Source: Oral (03/01 1454) BP: 123/82 (03/01 1454) Pulse Rate: 70 (03/01 1454)  Labs: Recent Labs    12/25/17 0755  HGB 14.5  HCT 43.0  PLT 236  CREATININE 1.03    Estimated Creatinine Clearance: 55.5 mL/min (by C-G formula based on SCr of 1.03 mg/dL).  Assessment:  84 yr old male to begin IV heparin for chest pain/ACS.  Hx CAD, NSTEMI, prior stents.  Goal of Therapy:  Heparin level 0.3-0.7 units/ml Monitor platelets by anticoagulation protocol: Yes   Plan:   Heparin 4000 units IV x 1  Heparin drip to begin at 1250 units/hr.  Heparin level ~6 hrs after drip begins.  Daily heparin level and CBC while on heparin.  Arty Baumgartner, [place] Pager: [phone] 12/25/2017,3:15 PM

## 2017-12-25 NOTE — ED Provider Notes (Signed)
[facility]E PROGRESSIVE CARE Provider Note   CSN: [id number] Arrival date & time: 12/25/17  0740     History   Chief Complaint Chief Complaint  Patient presents with  . jaw pain/ weakness    HPI Randall Callahan is a 84 y.o. male.  HPI   Patient is an 84 year old male with complicated past cardiac history.  Patient is a patient of Dr. Jacalyn Lefevre.  Patient has past medical history significant for ischemic cardiomyopathy with multiple occluded stents.  Most recently patient had a heart cath in December.  Showing multivessel disease, decision between CABG and PCI given patient's frail health, decision made for PCI.  Patient had DES to RCA at that time..  Patient has EF of 30%  Since then.  IMDUR went from 120- to 30 mg on Feb 7 because of low BP.  Patient presenting with increasing fatigue at home.  Patient feels he is unable to really go to th grocery store or do his daily activities because of shortness of breath/chest pain/fatigue.  Past Medical History:  Diagnosis Date  . Angina pectoris, unstable ([place])   . Angiomyolipoma of kidney   . Arthritis    "hips and knees" (09/29/2017)  . ASCVD (arteriosclerotic cardiovascular disease)   . Bradycardia   . Cancer [facility]) 2002   "iliac crest"  . Cancer of skin, face    "several" (09/29/2017)  . Cardiomyopathy ([place])   . Chest pain   . Chest tightness   . Chronic lower back pain   . CKD (chronic kidney disease), stage III ([place])    [name] 09/29/2017  . Complication of anesthesia    "my urinary tract becomes irritated; takes ~ 3-4 weeks to go away" (09/29/2017)  . Coronary artery disease   . Diverticulosis   . GERD (gastroesophageal reflux disease)   . High cholesterol   . Hx of sick sinus syndrome   . Hypertension   . MCI (mild cognitive impairment)   . MI (myocardial infarction) ([place]) 2003; ~ 12/2016;   . NSTEMI (non-ST elevation myocardial infarction) ([place]) 08/2016   [name] 09/29/2017  . Peripheral neuropathy   . Pneumonia  2000s X ?2  . Presence of permanent cardiac pacemaker 2015  . Schatzki's ring   . Squamous cell carcinoma in situ of skin   . Type II diabetes mellitus [facility])     Patient Active Problem List   Diagnosis Date Noted  . Unstable angina ([place]) 12/25/2017  . Squamous cell carcinoma in situ of skin   . Schatzki's ring   . Pneumonia   . Peripheral neuropathy   . MCI (mild cognitive impairment)   . Hypertension   . Hx of sick sinus syndrome   . High cholesterol   . Diverticulosis   . GERD (gastroesophageal reflux disease)   . CAD S/P percutaneous coronary angioplasty   . Complication of anesthesia   . CKD (chronic kidney disease), stage III ([place])   . Chronic lower back pain   . Chest tightness   . Chest pain   . Bradycardia   . Cancer of skin, face   . ASCVD (arteriosclerotic cardiovascular disease)   . Arthritis   . Angiomyolipoma of kidney   . Angina pectoris, unstable ([place])   . Dyspnea   . CHF (congestive heart failure) ([place]) 09/29/2017  . Hyperkalemia 09/29/2017  . Hyponatremia 09/29/2017  . Gait abnormality 01/08/2017  . Neck pain 01/08/2017  . Low back pain 01/08/2017  . Ischemic cardiomyopathy 12/17/2016  . Non-insulin treated  type 2 diabetes mellitus ([place]) 12/17/2016  . Hyperlipidemia 12/17/2016  . NSTEMI (non-ST elevation myocardial infarction) ([place]) 08/27/2016  . Presence of permanent cardiac pacemaker 10/27/2013  . Cancer ([place]) 10/27/2000    Past Surgical History:  Procedure Laterality Date  . CARDIAC CATHETERIZATION  >2003 X 2   "couldn't unblock the stents that were blocked"  . CATARACT EXTRACTION    . CATARACT EXTRACTION W/ INTRAOCULAR LENS IMPLANT Right 2000s  . COLONOSCOPY W/ BIOPSIES AND POLYPECTOMY    . CORONARY ANGIOGRAPHY    . CORONARY ANGIOPLASTY WITH STENT PLACEMENT  2003   "2 stents"  . CORONARY STENT INTERVENTION N/A 10/02/2017   Procedure: CORONARY STENT INTERVENTION;  Surgeon: Jettie Booze, MD;  Location: [place] CV LAB;  Service:  Cardiovascular;  Laterality: N/A;  . ESOPHAGOGASTRODUODENOSCOPY    . EXCISIONAL HEMORRHOIDECTOMY    . INSERT / REPLACE / REMOVE PACEMAKER  2015   [name] 09/29/2017  . JOINT REPLACEMENT    . LEFT HEART CATH N/A 10/02/2017   Procedure: Left Heart Cath;  Surgeon: Jettie Booze, MD;  Location: [place] CV [facility];  Service: Cardiovascular;  Laterality: N/A;  . LEFT HEART CATH AND CORONARY ANGIOGRAPHY N/A 09/30/2017   Procedure: LEFT HEART CATH AND CORONARY ANGIOGRAPHY;  Surgeon: Wellington Hampshire, MD;  Location: [place] CV LAB;  Service: Cardiovascular;  Laterality: N/A;  . MASS EXCISION Right 2002   "iliac crest cancer"  . SKIN CANCER EXCISION     "facial"  . TONSILLECTOMY    . TOTAL KNEE ARTHROPLASTY Left ~ 2012  . VASCULAR SURGERY         Home Medications    Prior to Admission medications   Medication Sig Start Date End Date Taking? Authorizing Provider  aspirin EC 81 MG tablet Take 81 mg by mouth daily with breakfast.    Yes [provider]  atorvastatin (LIPITOR) 40 MG tablet Take 1 tablet (40 mg total) by mouth at bedtime. 10/03/17  Yes Regalado, Belkys A, MD  bifidobacterium infantis (ALIGN) capsule Take 1 capsule by mouth daily.   Yes [provider]  clopidogrel (PLAVIX) 75 MG tablet Take 1 tablet (75 mg total) by mouth daily with breakfast. 10/03/17  Yes Regalado, Belkys A, MD  glucose blood ([employer] TEST STRIPS) test strip 1 each daily. 12/24/17  Yes [provider]  hydroxypropyl methylcellulose / hypromellose (ISOPTO TEARS / GONIOVISC) 2.5 % ophthalmic solution Place 2 drops into the right eye as needed for dry eyes.   Yes [provider]  isosorbide mononitrate (IMDUR) 60 MG 24 hr tablet Take 0.5 tablets (30 mg total) by mouth daily. 12/18/17  Yes [name], MD  lisinopril (PRINIVIL,ZESTRIL) 2.5 MG tablet Take 1 tablet (2.5 mg total) by mouth every evening. 10/22/17  Yes Kilroy, Doreene Burke, PA-C  metFORMIN (GLUCOPHAGE) 1000 MG  tablet Take 1 tablet (1,000 mg total) by mouth 2 (two) times daily with a meal. 10/04/17  Yes Regalado, Belkys A, MD  metoprolol succinate (TOPROL-XL) 25 MG 24 hr tablet Take 3 tablets (75 mg total) by mouth daily after breakfast. Take with or immediately following a meal. 11/25/17  Yes Crenshaw, Denice Bors, MD  nitroGLYCERIN (NITROSTAT) 0.4 MG SL tablet Place 1 tablet (0.4 mg total) under the tongue every 5 (five) minutes as needed for chest pain. Up to 3 doses for chest pain. If pain has not subsided after 3 doses go to the ER. 10/03/17  Yes Regalado, Cassie Freer, MD  Omega-3 Fatty Acids (FISH  OIL PO) Take 300 mg by mouth every evening.    Yes [provider]  Wheat Dextrin (BENEFIBER) POWD Take 15 mLs by mouth daily as needed (FOR FIBER).   Yes [provider]    Family History Family History  Problem Relation Age of Onset  . Angina Mother   . Stroke Mother   . Other Father        Car accident  . Prostate cancer Brother   . Cancer Brother   . Macular degeneration Sister   . Amblyopia Neg Hx   . Blindness Neg Hx   . Cataracts Neg Hx   . Glaucoma Neg Hx   . Retinal detachment Neg Hx   . Strabismus Neg Hx   . Retinitis pigmentosa Neg Hx     Social History Social History   Tobacco Use  . Smoking status: Former Smoker    Years: 2.00    Types: Cigarettes  . Smokeless tobacco: Never Used  . Tobacco comment: STARTED SMOKING IN 4TH GRADE, QUIT IN 6TH  Substance Use Topics  . Alcohol use: Yes    Alcohol/week: 0.6 oz    Types: 1 Cans of beer per week  . Drug use: No     Allergies   Patient has no known allergies.   Review of Systems Review of Systems  Constitutional: Positive for fatigue. Negative for activity change.  Respiratory: Positive for cough and shortness of breath.   Cardiovascular: Negative for chest pain.  Gastrointestinal: Negative for abdominal pain.     Physical Exam Updated Vital Signs BP 123/82   Pulse 70   Temp 97.9 F (36.6 C) (Oral)    Resp 20   Ht 6' (1.829 m)   Wt 90 kg (198 lb 6.6 oz)   SpO2 98%   BMI 26.91 kg/m   Physical Exam  Constitutional: He is oriented to person, place, and time. He appears well-nourished.  HENT:  Head: Normocephalic.  Eyes: Conjunctivae are normal. Right eye exhibits no discharge. Left eye exhibits no discharge.  Cardiovascular: Normal rate and regular rhythm.  Pulmonary/Chest: Effort normal and breath sounds normal. No respiratory distress.  Pacemaker present  Abdominal: Soft. He exhibits no distension.  Neurological: He is oriented to person, place, and time.  Skin: Skin is warm and dry. He is not diaphoretic.  Psychiatric: He has a normal mood and affect. His behavior is normal.     ED Treatments / Results  Labs (all labs ordered are listed, but only abnormal results are displayed) Labs Reviewed  BASIC METABOLIC PANEL - Abnormal; Notable for the following components:      Result Value   CO2 21 (*)    Glucose, Bld 195 (*)    All other components within normal limits  CBC - Abnormal; Notable for the following components:   RDW 15.6 (*)    All other components within normal limits  BRAIN NATRIURETIC PEPTIDE - Abnormal; Notable for the following components:   B Natriuretic Peptide 421.3 (*)    All other components within normal limits  TROPONIN I - Abnormal; Notable for the following components:   Troponin I 0.17 (*)    All other components within normal limits  HEMOGLOBIN A1C - Abnormal; Notable for the following components:   Hgb A1c MFr Bld 6.9 (*)    All other components within normal limits  GLUCOSE, CAPILLARY - Abnormal; Notable for the following components:   Glucose-Capillary 107 (*)    All other components within normal limits  URINALYSIS, ROUTINE W REFLEX MICROSCOPIC  GLUCOSE, CAPILLARY  TROPONIN I  TROPONIN I  COMPREHENSIVE METABOLIC PANEL  HEPARIN LEVEL (UNFRACTIONATED)  CBC  HEPARIN LEVEL (UNFRACTIONATED)  I-STAT TROPONIN, ED    EKG  EKG  Interpretation  Date/Time:  Friday December 25 2017 07:46:00 EST Ventricular Rate:  98 PR Interval:    QRS Duration: 180 QT Interval:  418 QTC Calculation: 533 R Axis:   -61 Text Interpretation:  Electronic ventricular pacemaker No significant change since last tracing Confirmed by Zenovia Jarred [phone]) on 12/25/2017 8:47:28 AM       Radiology Dg Chest 2 View  Result Date: 12/25/2017 CLINICAL DATA:  Bradycardia EXAM: CHEST  2 VIEW COMPARISON:  October 02, 2017 FINDINGS: Lungs are hyperexpanded with scarring in the lower lung zones. There is no appreciable edema or consolidation. Heart size and pulmonary vascularity are within normal limits. Pacemaker leads are attached the right atrium and right ventricle. There is degenerative change in the thoracic spine and shoulders. IMPRESSION: Lungs hyperexpanded with lower lobe scarring. No edema or consolidation. Heart size normal. No adenopathy evident. Pacemaker leads attached to right atrium and right ventricle. Electronically Signed   By: Lowella Grip III M.D.   On: 12/25/2017 08:09    Procedures Procedures (including critical care time)  Medications Ordered in ED Medications  nitroGLYCERIN (NITROSTAT) SL tablet 0.4 mg (not administered)  acetaminophen (TYLENOL) tablet 650 mg (not administered)  ondansetron (ZOFRAN) injection 4 mg (not administered)  zolpidem (AMBIEN) tablet 5 mg (not administered)  ALPRAZolam (XANAX) tablet 0.25 mg (not administered)  insulin aspart (novoLOG) injection 0-15 Units (0 Units Subcutaneous Not Given 12/25/17 1641)  insulin aspart (novoLOG) injection 0-5 Units (not administered)  ranolazine (RANEXA) 12 hr tablet 500 mg (500 mg Oral Given 12/25/17 1638)  aspirin EC tablet 81 mg (not administered)  atorvastatin (LIPITOR) tablet 40 mg (not administered)  clopidogrel (PLAVIX) tablet 75 mg (not administered)  hydroxypropyl methylcellulose / hypromellose (ISOPTO TEARS / GONIOVISC) 2.5 % ophthalmic solution 2 drop  (not administered)  isosorbide mononitrate (IMDUR) 24 hr tablet 30 mg (not administered)  lisinopril (PRINIVIL,ZESTRIL) tablet 2.5 mg (2.5 mg Oral Given 12/25/17 1638)  metoprolol succinate (TOPROL-XL) 24 hr tablet 75 mg (not administered)  omega-3 acid ethyl esters (LOVAZA) capsule 1 g (1 g Oral Given 12/25/17 1639)  heparin ADULT infusion 100 units/mL (25000 units/234mL sodium chloride 0.45%) (1,250 Units/hr Intravenous New Bag/Given 12/25/17 1633)  aspirin chewable tablet 324 mg (324 mg Oral Given 12/25/17 0942)  heparin bolus via infusion 4,000 Units (4,000 Units Intravenous Bolus from Bag 12/25/17 1633)     Initial Impression / Assessment and Plan / ED Course  I have reviewed the triage vital signs and the nursing notes.  Pertinent labs & imaging results that were available during my care of the patient were reviewed by me and considered in my medical decision making (see chart for details).     Patient is an 85 year old male with complicated past cardiac history.  Patient is a patient of Dr. Jacalyn Lefevre.  Patient has past medical history significant for ischemic cardiomyopathy with multiple occluded stents.  Most recently patient had a heart cath in December.  Showing multivessel disease, decision between CABG and PCI given patient's frail health, decision made for PCI.  Patient had DES to RCA at that time..  Patient has EF of 30%  Since then.  IMDUR went from 120- to 30 mg on Feb 7 because of low BP.  Patient presenting with increasing fatigue at home.  Patient feels he is unable to really go to th grocery store or do his daily activities because of shortness of breath/chest pain/fatigue.  7:24 PM Concerned because patient has extensive cardiac history that is active and patient has active symptoms that are concerning for low-grade ischemia versus worsening EF.  Will discuss with cardiology.  Cards to admit patient.   Final Clinical Impressions(s) / ED Diagnoses   Final diagnoses:  None     ED Discharge Orders    None       Macarthur Critchley, MD 12/25/17 1924

## 2017-12-25 NOTE — Consult Note (Addendum)
Error  Rosaria Ferries, PA-C 12/25/2017 11:59 AM Beeper [phone]

## 2017-12-25 NOTE — H&P (Addendum)
Cardiology HIstory and Physical:   Patient ID: Randall Callahan; [id number]; July 28, 1928   Admit date: 12/25/2017 Date of Consult: 12/25/2017  Primary Care Provider: Dineen Kid, MD Primary Cardiologist: Dr Stanford Breed Primary Electrophysiologist:  n/a   Patient Profile:   Randall Callahan is a 84 y.o. male with a hx of stents LAD & CFX 2003, bradycardia s/p StJ PPM, NSTEMI 08/2106>>med rx for 100% LAD, NSTEMI 09/2017 s/p DES RI w/ med rx for 100% LAD & CFX (consider CTO LAD if sx not controlled), CKD III, ICM w/ EF 30%, DM, HTN, HLD, GERD, periph neuropathy, who is being seen today for the evaluation of chest pain at the request of Dr Thomasene Lot.  History of Present Illness:   Randall Callahan was feeling generally lousy last pm and this am, his HR had been elevated >100 and SBP was 90s. He had no energy.  He felt worse this am, jaw discomfort had started during the night, feels this is his angina. He feels lying down made it worse, walking around makes it worse. He called 911 at 3 am, sat up for a while and felt better, did not go to the ER. This am, he still felt bad, knew something was wrong and came to the ER.  Currently, he feels ok sitting up, denies CP or sob. The only med he was given is ASA 324 mg.  He feels his normal weight is 190, does not weigh (cannot see the numbers).   He gets tired easily. In general, denies PND but has nocturia. Denies orthopnea chronically, but felt worse lying down last night. No LE edema.  Denies recent sx and did 2 cardiac rehab sessions this week without problems.   However, he admits his stamina is poor, worse than it used to be. This seems to be a gradual thing, unclear if it started in December after his last MI.   Past Medical History:  Diagnosis Date  . Angina pectoris, unstable ([place])   . Angiomyolipoma of kidney   . Arthritis    "hips and knees" (09/29/2017)  . ASCVD (arteriosclerotic cardiovascular disease)   . Bradycardia   . Cancer [facility])  2002   "iliac crest"  . Cancer of skin, face    "several" (09/29/2017)  . Cardiomyopathy ([place])   . Chest pain   . Chest tightness   . Chronic lower back pain   . CKD (chronic kidney disease), stage III ([place])    [name] 09/29/2017  . Complication of anesthesia    "my urinary tract becomes irritated; takes ~ 3-4 weeks to go away" (09/29/2017)  . Coronary artery disease   . Diverticulosis   . GERD (gastroesophageal reflux disease)   . High cholesterol   . Hx of sick sinus syndrome   . Hypertension   . MCI (mild cognitive impairment)   . MI (myocardial infarction) ([place]) 2003; ~ 12/2016;   . NSTEMI (non-ST elevation myocardial infarction) ([place]) 08/2016   [name] 09/29/2017  . Peripheral neuropathy   . Pneumonia 2000s X ?2  . Presence of permanent cardiac pacemaker 2015  . Schatzki's ring   . Squamous cell carcinoma in situ of skin   . Type II diabetes mellitus ([place])     Past Surgical History:  Procedure Laterality Date  . CARDIAC CATHETERIZATION  >2003 X 2   "couldn't unblock the stents that were blocked"  . CATARACT EXTRACTION    . CATARACT EXTRACTION W/ INTRAOCULAR LENS IMPLANT Right 2000s  . COLONOSCOPY W/ BIOPSIES AND POLYPECTOMY    .  CORONARY ANGIOGRAPHY    . CORONARY ANGIOPLASTY WITH STENT PLACEMENT  2003   "2 stents"  . CORONARY STENT INTERVENTION N/A 10/02/2017   Procedure: CORONARY STENT INTERVENTION;  Surgeon: Jettie Booze, MD;  Location: [facility];  Service: Cardiovascular;  Laterality: N/A;  . ESOPHAGOGASTRODUODENOSCOPY    . EXCISIONAL HEMORRHOIDECTOMY    . INSERT / REPLACE / REMOVE PACEMAKER  2015   [name] 09/29/2017  . JOINT REPLACEMENT    . LEFT HEART CATH N/A 10/02/2017   Procedure: Left Heart Cath;  Surgeon: Jettie Booze, MD;  Location: [facility];  Service: Cardiovascular;  Laterality: N/A;  . LEFT HEART CATH AND CORONARY ANGIOGRAPHY N/A 09/30/2017   Procedure: LEFT HEART CATH AND CORONARY ANGIOGRAPHY;  Surgeon: Wellington Hampshire,  MD;  Location: [facility];  Service: Cardiovascular;  Laterality: N/A;  . MASS EXCISION Right 2002   "iliac crest cancer"  . SKIN CANCER EXCISION     "facial"  . TONSILLECTOMY    . TOTAL KNEE ARTHROPLASTY Left ~ 2012  . VASCULAR SURGERY       Prior to Admission medications   Medication Sig  aspirin EC 81 MG tablet Take 81 mg by mouth daily with breakfast.   atorvastatin (LIPITOR) 40 MG tablet Take 1 tablet (40 mg total) by mouth at bedtime.  bifidobacterium infantis (ALIGN) capsule Take 1 capsule by mouth daily.  clopidogrel (PLAVIX) 75 MG tablet Take 1 tablet (75 mg total) by mouth daily with breakfast.  glucose blood ([employer] TEST STRIPS) test strip 1 each daily.  hydroxypropyl methylcellulose / hypromellose (ISOPTO TEARS / GONIOVISC) 2.5 % ophthalmic solution Place 2 drops into the right eye as needed for dry eyes.  isosorbide mononitrate (IMDUR) 60 MG 24 hr tablet Take 0.5 tablets (30 mg total) by mouth daily.  lisinopril (PRINIVIL,ZESTRIL) 2.5 MG tablet Take 1 tablet (2.5 mg total) by mouth every evening.  metFORMIN (GLUCOPHAGE) 1000 MG tablet Take 1 tablet (1,000 mg total) by mouth 2 (two) times daily with a meal.  metoprolol succinate (TOPROL-XL) 25 MG 24 hr tablet Take 3 tablets (75 mg total) by mouth daily after breakfast. Take with or immediately following a meal.  nitroGLYCERIN (NITROSTAT) 0.4 MG SL tablet Place 1 tablet (0.4 mg total) under the tongue every 5 (five) minutes as needed for chest pain. Up to 3 doses for chest pain. If pain has not subsided after 3 doses go to the ER.  Omega-3 Fatty Acids (FISH OIL PO) Take 300 mg by mouth every evening.   Wheat Dextrin (BENEFIBER) POWD Take 15 mLs by mouth daily as needed (FOR FIBER).    Inpatient Medications: Scheduled Meds:  Continuous Infusions:  PRN Meds:   Allergies:   No Known Allergies  Social History:   Social History   Socioeconomic History  . Marital status: Divorced    Spouse name: Not on file  .  Number of children: 6  . Years of education: some college  . Highest education level: Not on file  Social Needs  . Financial resource strain: Not on file  . Food insecurity - worry: Not on file  . Food insecurity - inability: Not on file  . Transportation needs - medical: Not on file  . Transportation needs - non-medical: Not on file  Occupational History  . Occupation: Retired  Tobacco Use  . Smoking status: Former Smoker    Years: 2.00    Types: Cigarettes  . Smokeless tobacco: Never Used  . Tobacco  comment: STARTED SMOKING IN 4TH GRADE, QUIT IN 6TH  Substance and Sexual Activity  . Alcohol use: Yes    Alcohol/week: 0.6 oz    Types: 1 Cans of beer per week  . Drug use: No  . Sexual activity: No  Other Topics Concern  . Not on file  Social History Narrative   Lives at home with son and daughter-in-law.   Right-handed.   2 cups caffeine per day.    Family History:   Family History  Problem Relation Age of Onset  . Angina Mother   . Stroke Mother   . Other Father        Car accident  . Prostate cancer Brother   . Cancer Brother   . Macular degeneration Sister   . Amblyopia Neg Hx   . Blindness Neg Hx   . Cataracts Neg Hx   . Glaucoma Neg Hx   . Retinal detachment Neg Hx   . Strabismus Neg Hx   . Retinitis pigmentosa Neg Hx    Family Status:  Family Status  Relation Name Status  . Mother  Deceased  . Father  Deceased  . Brother  Deceased  . MGM  Deceased  . MGF  Deceased  . PGM  Deceased  . PGF  Deceased  . Brother  Deceased  . Sister  (Not Specified)  . Neg Hx  (Not Specified)    ROS:  Please see the history of present illness.  All other ROS reviewed and negative.     Physical Exam/Data:   Vitals:   12/25/17 0750 12/25/17 0752  BP: 96/69   Pulse: 95   Resp: 18   Temp: 97.7 F (36.5 C)   TempSrc: Oral   SpO2: 100%   Weight:  198 lb 6.6 oz (90 kg)  Height:  6' (1.829 m)   No intake or output data in the 24 hours ending 12/25/17  1106 Filed Weights   12/25/17 0752  Weight: 198 lb 6.6 oz (90 kg)   Body mass index is 26.91 kg/m.  General:  Well nourished, well developed, in no acute distress HEENT: normal Lymph: no adenopathy Neck: minimal JVD Endocrine:  No thryomegaly Vascular: No carotid bruits; upper extremity pulses 2+, LE pulses decreased bu palpable  Cardiac:  normal S1, S2; RRR; soft murmur  Lungs:  Few rales bases bilaterally, no wheezing, rhonchi   Abd: soft, nontender, no hepatomegaly  Ext: no edema Musculoskeletal:  No deformities, BUE and BLE strength normal and equal Skin: warm and dry  Neuro:  CNs 2-12 intact, no focal abnormalities noted Psych:  Normal affect   EKG:  The EKG was personally reviewed and demonstrates:  SR, V pacing, PVCs, HR 98 Telemetry:  Telemetry was personally reviewed and demonstrates: SR, Vpacing, frequent ectopy  Relevant CV Studies:  ECHO: 10/01/2017 - Left ventricle: The cavity size was normal. There was moderate   concentric hypertrophy. Systolic function was moderately to   severely reduced. The estimated ejection fraction was in the   range of 30% to 35%. Dyskinesis of the apical myocardium. Severe   hypokinesis of the mid-apicalanteroseptal, anterior, and   inferoseptal myocardium; consistent with infarction in the   distribution of the left anterior descending coronary artery.   Features are consistent with a pseudonormal left ventricular   filling pattern, with concomitant abnormal relaxation and   increased filling pressure (grade 2 diastolic dysfunction).   Acoustic contrast opacification revealed no evidence ofthrombus. - Aortic valve: There was mild to  moderate regurgitation directed   centrally in the LVOT. - Mitral valve: Calcified annulus. Mildly thickened leaflets .   There was malcoaptation of the valve leaflets. There was moderate   to severe regurgitation directed eccentrically and posteriorly. - Left atrium: The atrium was mildly dilated. -  Pulmonary arteries: Systolic pressure was moderately increased.   PA peak pressure: 48 mm Hg (S).  CATH: 10/02/2017  Prox LAD lesion is 100% stenosed.  Ost Cx to Prox Cx lesion is 100% stenosed.  Prox RCA lesion is 30% stenosed. Noted on prior cath. Right to left collaterals.  Ost Ramus to mid Ramus lesion is 90% stenosed.  A drug-eluting stent was successfully placed using a STENT SIERRA 3.00 X 38 MM, treating the mid to distal disease, postdilated to 3.4-3.5 mm using the 3.25 stent balloon and the 3.5 balloon more proximally.  A drug-eluting stent was successfully placed using a STENT SIERRA 3.25 X 12 MM, treating the proximal ramus, postdilated to 3.5 mm.  Post intervention, there is a 0% residual stenosis.  LV end diastolic pressure is normal.  There is no aortic valve stenosis.  Continue dual antiplatelet therapy for at least one year.  Could consider CTO PCI of the LAD in the future if this area is viable and he had refractory symptoms.   Post-Intervention Diagram          Laboratory Data:  Chemistry Recent Labs  Lab 12/25/17 0755  NA 137  K 4.2  CL 104  CO2 21*  GLUCOSE 195*  BUN 13  CREATININE 1.03  CALCIUM 9.4  GFRNONAA >60  GFRAA >60  ANIONGAP 12    Lab Results  Component Value Date   ALT 14 (L) 09/30/2017   AST 40 09/30/2017   ALKPHOS 81 09/30/2017   BILITOT 1.8 (H) 09/30/2017   Hematology Recent Labs  Lab 12/25/17 0755  WBC 9.8  RBC 4.93  HGB 14.5  HCT 43.0  MCV 87.2  MCH 29.4  MCHC 33.7  RDW 15.6*  PLT 236   Cardiac EnzymesNo results for input(s): TROPONINI in the last 168 hours.  Recent Labs  Lab 12/25/17 0806  TROPIPOC 0.04     TSH:  Lab Results  Component Value Date   TSH 1.137 09/29/2017    Radiology/Studies:  Dg Chest 2 View  Result Date: 12/25/2017 CLINICAL DATA:  Bradycardia EXAM: CHEST  2 VIEW COMPARISON:  October 02, 2017 FINDINGS: Lungs are hyperexpanded with scarring in the lower lung zones. There is no  appreciable edema or consolidation. Heart size and pulmonary vascularity are within normal limits. Pacemaker leads are attached the right atrium and right ventricle. There is degenerative change in the thoracic spine and shoulders. IMPRESSION: Lungs hyperexpanded with lower lobe scarring. No edema or consolidation. Heart size normal. No adenopathy evident. Pacemaker leads attached to right atrium and right ventricle. Electronically Signed   By: Lowella Grip III M.D.   On: 12/25/2017 08:09    Assessment and Plan:    Principal Problem: 1.  Angina pectoris, unstable (HCC) - Jaw pain is his anginal equivalent, he has previously said that he will get this if his HR is > 100 - known occlusion of LAD and CFX, unable to tolerate higher doses of Imdur due to orthostatic hypotension - per Dr Hassell Done note at cath, consider  CTO LAD if sx not controlled. - He has not been on Ranexa>>will start  - MD advise on continuing med rx vs arranging CTO  Active Problems: 2.  Ischemic cardiomyopathy -  on lisinopril 2.5 mg qpm and Toprol XL 75 mg qd - was on these meds when he moved here in 2018 plus Imdur 30 mg - tried to increase Imdur, BP dropped>>back to 30 mg qd - He is V pacing - not on diuretic but no volume overload by exam  3.  Non-insulin treated type 2 diabetes mellitus (HCC) - glucose elevated this am - add SSI, hold metformin  4.  Hypertension - more problems recently w/ Hypotension  5.  CKD (chronic kidney disease), stage III ([facility]CC) - BUN/Cr ok today  6. General malaise - ck UA   Severity of Illness: The appropriate patient status for this patient is INPATIENT. Inpatient status is judged to be reasonable and necessary in order to provide the required intensity of service to ensure the patient's safety. The patient's presenting symptoms, physical exam findings, and initial radiographic and laboratory data in the context of their chronic comorbidities is felt to place them at high  risk for further clinical deterioration. Furthermore, it is not anticipated that the patient will be medically stable for discharge from the hospital within 2 midnights of admission. The following factors support the patient status of inpatient.   " The patient's presenting symptoms include angina. " The worrisome physical exam findings include abnormal orthostatic hypotension. " The initial radiographic and laboratory data are worrisome because of abnormal ECG. " The chronic co-morbidities include hx CAD, LAD 100%.   * I certify that at the point of admission it is my clinical judgment that the patient will require inpatient hospital care spanning beyond 2 midnights from the point of admission due to high intensity of service, high risk for further deterioration and high frequency of surveillance required.*     For questions or updates, please contact [place] Please consult [email or web address] for contact info under Cardiology/STEMI.   Signed, Rosaria Ferries, PA-C  12/25/2017 11:06 AM  I have examined the patient and reviewed assessment and plan and discussed with patient.  Agree with above as stated.  R/o for MI.  Principal sx if fatigue, but then he had some jaw pain.  He did well with cardiac rehab two days this week.  He has several reasons for fatigue including his age.  In 11/18, we fixed the ramus and deferred the LAD due to questions of anterior wall viability.  Cannot do MRI due to pacer.  WIll try to adjust meds.  Start Ranexa.  Sx started with high HR.  Watch on monitor.  It could be that he had demand ischemia in the setting of rapid heart rate.  We discussed the idea of LAD CTO PCI.  LAD has been occluded since 2008 per his records from [facility] which I reviewed.  His anterior wall and apex appear severely hypokinetic as well.  I am not sure how beneficial a PCI would be, although I think the vessel could be intervened upon from a technical standpoint.    Patient and family are  aware of plan to change meds.  D/w Dr. Stanford Breed as well.  Next CTO day is March 13.  If he has refractory sx, could consider procedure on that day.    If he rules out and no significant abnormality on tele, could consider discharge in AM.   Larae Grooms

## 2017-12-25 NOTE — Progress Notes (Signed)
Called by nurse for troponin 0.17.   Patient asymptomatic. Continue heparin per pharmacy. CTO PCI being considered this admission, enzyme trend may help guide that decision - further sets pending. Dayna Dunn PA-C

## 2017-12-25 NOTE — ED Notes (Signed)
Pacemaker interrogated. 

## 2017-12-26 LAB — COMPREHENSIVE METABOLIC PANEL
ALBUMIN: 3.4 g/dL — AB (ref 3.5–5.0)
ALT: 12 U/L — AB (ref 17–63)
AST: 17 U/L (ref 15–41)
Alkaline Phosphatase: 71 U/L (ref 38–126)
Anion gap: 10 (ref 5–15)
BILIRUBIN TOTAL: 1.7 mg/dL — AB (ref 0.3–1.2)
BUN: 16 mg/dL (ref 6–20)
CO2: 23 mmol/L (ref 22–32)
CREATININE: 1.05 mg/dL (ref 0.61–1.24)
Calcium: 9.3 mg/dL (ref 8.9–10.3)
Chloride: 106 mmol/L (ref 101–111)
GFR calc Af Amer: >60 mL/min (ref >60)
GFR calc non Af Amer: >60 mL/min (ref >60)
Glucose, Bld: 116 mg/dL — ABNORMAL HIGH (ref 65–99)
POTASSIUM: 4.3 mmol/L (ref 3.5–5.1)
Sodium: 139 mmol/L (ref 135–145)
TOTAL PROTEIN: 6.2 g/dL — AB (ref 6.5–8.1)

## 2017-12-26 LAB — GLUCOSE, CAPILLARY
GLUCOSE-CAPILLARY: 131 mg/dL — AB (ref 65–99)
GLUCOSE-CAPILLARY: 140 mg/dL — AB (ref 65–99)
GLUCOSE-CAPILLARY: 124 mg/dL — AB (ref 65–99)
Glucose-Capillary: 122 mg/dL — ABNORMAL HIGH (ref 65–99)

## 2017-12-26 LAB — CBC
HEMATOCRIT: 39.7 % (ref 39.0–52.0)
HEMOGLOBIN: 13.1 g/dL (ref 13.0–17.0)
MCH: 28.5 pg (ref 26.0–34.0)
MCHC: 33 g/dL (ref 30.0–36.0)
MCV: 86.5 fL (ref 78.0–100.0)
Platelets: 229 10*3/uL (ref 150–400)
RBC: 4.59 MIL/uL (ref 4.22–5.81)
RDW: 15.4 % (ref 11.5–15.5)
WBC: 7.4 10*3/uL (ref 4.0–10.5)

## 2017-12-26 LAB — TROPONIN I: Troponin I: 0.15 ng/mL (ref <0.03)

## 2017-12-26 LAB — OCCULT BLOOD X 1 CARD TO LAB, STOOL: FECAL OCCULT BLD: NEGATIVE

## 2017-12-26 LAB — HEPARIN LEVEL (UNFRACTIONATED): Heparin Unfractionated: 0.47 IU/mL (ref 0.30–0.70)

## 2017-12-26 NOTE — Progress Notes (Signed)
Subjective:  Daughter in room with patient with many questions and quite anxious. Complains that he is weak.  No jaw pain since yesterday.  Troponins have been mildly elevated and are flat.concerned about fluctuation of blood pressures.  Objective:  Vital Signs in the last 24 hours: BP 105/70   Pulse 75   Temp 98.3 F (36.8 C) (Oral)   Resp 16   Ht 6' (1.829 m)   Wt 88.4 kg (194 lb 14.2 oz)   SpO2 97%   BMI 26.43 kg/m   Physical Exam: Elderly somewhat frail appearing male in no acute distress Lungs:  Clear Cardiac:  Regular rhythm, normal S1 and S2, no S3 Extremities:  No edema present  Intake/Output from previous day: 03/01 0701 - 03/02 0700 In: 573.1 [P.O.:480; I.V.:93.1] Out: 700 [Urine:700]  Weight Filed Weights   12/25/17 0752 12/26/17 0500  Weight: 90 kg (198 lb 6.6 oz) 88.4 kg (194 lb 14.2 oz)    Lab Results: Basic Metabolic Panel: Recent Labs    12/25/17 0755 12/26/17 0053  NA 137 139  K 4.2 4.3  CL 104 106  CO2 21* 23  GLUCOSE 195* 116*  BUN 13 16  CREATININE 1.03 1.05   CBC: Recent Labs    12/25/17 0755 12/26/17 0053  WBC 9.8 7.4  HGB 14.5 13.1  HCT 43.0 39.7  MCV 87.2 86.5  PLT 236 229   Cardiac Enzymes: Troponin (Point of Care Test) Recent Labs    12/25/17 0806  TROPIPOC 0.04   Cardiac Panel (last 3 results) Recent Labs    12/25/17 1507 12/25/17 2040 12/26/17 0208  TROPONINI 0.17* 0.14* 0.15*    Telemetry: Sinus with paced beats, occasional PVCs  Assessment/Plan:  1.  Flat troponin at this time 2.  CAD with total occlusion of the LAD 3.  Weakness and malaise of uncertain etiology  Recommendations:  Very difficult to assess.  Tolerating Ranexa reasonably well but some concern about blood pressure.  Advise patient to ambulate and get out of bed.  Keep in the hospital today because of troponin and continue to monitor.      Kerry Hough  MD [facility] Cardiology  12/26/2017, 1:12 PM

## 2017-12-26 NOTE — Progress Notes (Signed)
ANTICOAGULATION CONSULT NOTE - Follow Up Consult  Pharmacy Consult for Heparin Indication: chest pain/ACS  No Known Allergies  Patient Measurements: Height: 6' (182.9 cm) Weight: 198 lb 6.6 oz (90 kg) IBW/kg (Calculated) : 77.6 Heparin Dosing Weight: 90 kg  Vital Signs: Temp: 98.3 F (36.8 C) (03/01 2031) Temp Source: Oral (03/01 2031) BP: 98/57 (03/01 2220) Pulse Rate: 72 (03/01 2220)  Labs: Recent Labs    12/25/17 0755 12/25/17 1507 12/25/17 2040 12/26/17 0053  HGB 14.5  --   --  13.1  HCT 43.0  --   --  39.7  PLT 236  --   --  229  HEPARINUNFRC  --   --   --  0.47  CREATININE 1.03  --   --   --   TROPONINI  --  0.17* 0.14*  --     Estimated Creatinine Clearance: 55.5 mL/min (by C-G formula based on SCr of 1.03 mg/dL).  Assessment:  84 yr old male to begin IV heparin for chest pain/ACS.  Hx CAD, NSTEMI, prior stents.  Initial heparin level at goal (0.47). Hgb down 14.5>13.1 but no bleeding noted.  Goal of Therapy:  Heparin level 0.3-0.7 units/ml Monitor platelets by anticoagulation protocol: Yes   Plan:  Continue Heparin drip at 1250 units/hr. Confirmatory heparin level in 8 hours Daily heparin level and CBC while on heparin.  Erin Hearing PharmD., BCPS Clinical Pharmacist 12/26/2017 2:00 AM

## 2017-12-26 NOTE — Plan of Care (Signed)
  Clinical Measurements: Ability to maintain clinical measurements within normal limits will improve 12/26/2017 2006 - Progressing by Irish Lack, RN   Clinical Measurements: Cardiovascular complication will be avoided 12/26/2017 2006 - Progressing by Irish Lack, RN   Pain Managment: General experience of comfort will improve 12/26/2017 2006 - Progressing by Irish Lack, RN

## 2017-12-27 LAB — CBC
HCT: 39.4 % (ref 39.0–52.0)
Hemoglobin: 13.3 g/dL (ref 13.0–17.0)
MCH: 29 pg (ref 26.0–34.0)
MCHC: 33.8 g/dL (ref 30.0–36.0)
MCV: 85.8 fL (ref 78.0–100.0)
PLATELETS: 219 10*3/uL (ref 150–400)
RBC: 4.59 MIL/uL (ref 4.22–5.81)
RDW: 15.3 % (ref 11.5–15.5)
WBC: 6.5 10*3/uL (ref 4.0–10.5)

## 2017-12-27 LAB — GLUCOSE, CAPILLARY
GLUCOSE-CAPILLARY: 121 mg/dL — AB (ref 65–99)
GLUCOSE-CAPILLARY: 161 mg/dL — AB (ref 65–99)
Glucose-Capillary: 203 mg/dL — ABNORMAL HIGH (ref 65–99)
Glucose-Capillary: 166 mg/dL — ABNORMAL HIGH (ref 65–99)

## 2017-12-27 LAB — HEPARIN LEVEL (UNFRACTIONATED): HEPARIN UNFRACTIONATED: 0.2 [IU]/mL — AB (ref 0.30–0.70)

## 2017-12-27 MED ORDER — HEPARIN BOLUS VIA INFUSION
1350.0000 [IU] | Freq: Once | INTRAVENOUS | Status: AC
  Administered 2017-12-27: 1350 [IU] via INTRAVENOUS
  Filled 2017-12-27: qty 1350

## 2017-12-27 NOTE — Progress Notes (Signed)
ANTICOAGULATION CONSULT NOTE - Follow Up Consult  Pharmacy Consult for heparin Indication: chest pain/ACS  No Known Allergies  Patient Measurements: Height: 6' (182.9 cm) Weight: 195 lb 15.8 oz (88.9 kg) IBW/kg (Calculated) : 77.6 Heparin Dosing Weight: 90 kg  Vital Signs: Temp: 98.2 F (36.8 C) (03/03 0558) Temp Source: Oral (03/03 0558) BP: 122/67 (03/03 0910) Pulse Rate: 81 (03/03 0558)  Labs: Recent Labs    12/25/17 0755 12/25/17 1507 12/25/17 2040 12/26/17 0053 12/26/17 0208 12/27/17 0759  HGB 14.5  --   --  13.1  --  13.3  HCT 43.0  --   --  39.7  --  39.4  PLT 236  --   --  229  --  219  HEPARINUNFRC  --   --   --  0.47  --  0.20*  CREATININE 1.03  --   --  1.05  --   --   TROPONINI  --  0.17* 0.14*  --  0.15*  --     Estimated Creatinine Clearance: 53.4 mL/min (by C-G formula based on SCr of 1.05 mg/dL).   Medications:  Scheduled:  . aspirin EC  81 mg Oral Q breakfast  . atorvastatin  40 mg Oral QHS  . clopidogrel  75 mg Oral Q breakfast  . heparin  1,350 Units Intravenous Once  . insulin aspart  0-15 Units Subcutaneous TID WC  . insulin aspart  0-5 Units Subcutaneous QHS  . isosorbide mononitrate  30 mg Oral Daily  . lisinopril  2.5 mg Oral QPM  . metoprolol succinate  75 mg Oral QPC breakfast  . omega-3 acid ethyl esters  1 g Oral Daily  . ranolazine  500 mg Oral BID   Infusions:  . heparin 1,250 Units/hr (12/27/17 0529)    Assessment: 62 YOM with extensive history of CAD with multiple prior stents on heparin for chest pain/ACS r/o. Initial heparin level was therapeutic, but level today is low at 0.2. Per RN no issues with infusion were reported. CBC is wnl, no bleeding noted.  Goal of Therapy:  Heparin level 0.3-0.7 units/ml Monitor platelets by anticoagulation protocol: Yes   Plan:  Give heparin bolus 1350 units x 1 Increase heparin rate to 1400 units/hr Recheck heparin level in 8 hours Monitor daily heparin level, CBC, s/sx of  bleeding F/u cardiology plans    Charlene Brooke, PharmD PGY1 Pharmacy Resident Phone: [phone] After 3:30PM please call [place] [phone] 12/27/2017,9:33 AM

## 2017-12-27 NOTE — Plan of Care (Signed)
  Clinical Measurements: Cardiovascular complication will be avoided 12/27/2017 2003 - Progressing by Irish Lack, RN   Clinical Measurements: Ability to maintain clinical measurements within normal limits will improve 12/27/2017 2003 - Progressing by Irish Lack, RN

## 2017-12-27 NOTE — Progress Notes (Addendum)
Subjective:  No recurrent jaw pain overnight.  He has a persistent tachycardia and on review of telemetry strips that almost looks assess he is having PMT. No shortness of breath.  Daughter concerned about fluctuation of blood pressure stable.  Objective:  Vital Signs in the last 24 hours: BP 122/67   Pulse 81   Temp 98.2 F (36.8 C) (Oral)   Resp 18   Ht 6' (1.829 m)   Wt 88.9 kg (195 lb 15.8 oz)   SpO2 96%   BMI 26.58 kg/m   Physical Exam: Elderly somewhat frail appearing male in no acute distress Lungs:  Clear Cardiac:  Regular rhythm, normal S1 and S2, no S3 Extremities:  No edema present  Intake/Output from previous day: 03/02 0701 - 03/03 0700 In: 160 [P.O.:60; I.V.:100] Out: 1175 [Urine:1175]  Weight Filed Weights   12/25/17 0752 12/26/17 0500 12/27/17 0530  Weight: 90 kg (198 lb 6.6 oz) 88.4 kg (194 lb 14.2 oz) 88.9 kg (195 lb 15.8 oz)    Lab Results: Basic Metabolic Panel: Recent Labs    12/25/17 0755 12/26/17 0053  NA 137 139  K 4.2 4.3  CL 104 106  CO2 21* 23  GLUCOSE 195* 116*  BUN 13 16  CREATININE 1.03 1.05   CBC: Recent Labs    12/26/17 0053 12/27/17 0759  WBC 7.4 6.5  HGB 13.1 13.3  HCT 39.7 39.4  MCV 86.5 85.8  PLT 229 219   Cardiac Enzymes: Troponin (Point of Care Test) Recent Labs    12/25/17 0806  TROPIPOC 0.04   Cardiac Panel (last 3 results) Recent Labs    12/25/17 1507 12/25/17 2040 12/26/17 0208  TROPONINI 0.17* 0.14* 0.15*    Telemetry: Sinus with paced beats, occasional PVC, some strips resemble PMT with no P waves seen.  Assessment/Plan:  1.  Flat troponin at this time 2.  CAD with total occlusion of the LAD  3.  Weakness and malaise of uncertain etiology 4.  Persistent tachycardia possible pacemaker mediated tachycardia  Recommendations:  No recurrence of angina.  Daughter is pushing for what the definitive plan is.  I told her that I were concerned about the tachycardia and asked EP and  pacemaker  representative to evaluate for PMT.  Also if the rate response is turned on this needs to be adjusted so he does not become tachycardic.  If this is sinus tachycardia he will probably need to have some beta blocker increase or something to avoid tachycardia that may be driving angina.  Keep in overnight and ask pacemaker to be adjusted.  Discontinue heparin.     Kerry Hough  MD [facility]  12/27/2017, 10:42 AM

## 2017-12-28 ENCOUNTER — Encounter (HOSPITAL_COMMUNITY): Payer: Medicare Other

## 2017-12-28 LAB — CBC
HCT: 39.2 % (ref 39.0–52.0)
HEMOGLOBIN: 13 g/dL (ref 13.0–17.0)
MCH: 28.8 pg (ref 26.0–34.0)
MCHC: 33.2 g/dL (ref 30.0–36.0)
MCV: 86.7 fL (ref 78.0–100.0)
Platelets: 217 10*3/uL (ref 150–400)
RBC: 4.52 MIL/uL (ref 4.22–5.81)
RDW: 15.8 % — ABNORMAL HIGH (ref 11.5–15.5)
WBC: 6.6 10*3/uL (ref 4.0–10.5)

## 2017-12-28 LAB — GLUCOSE, CAPILLARY
GLUCOSE-CAPILLARY: 146 mg/dL — AB (ref 65–99)
GLUCOSE-CAPILLARY: 92 mg/dL (ref 65–99)
Glucose-Capillary: 168 mg/dL — ABNORMAL HIGH (ref 65–99)
Glucose-Capillary: 121 mg/dL — ABNORMAL HIGH (ref 65–99)

## 2017-12-28 MED ORDER — GUAIFENESIN-DM 100-10 MG/5ML PO SYRP
5.0000 mL | ORAL_SOLUTION | ORAL | Status: DC | PRN
  Administered 2017-12-28: 5 mL via ORAL
  Filled 2017-12-28: qty 5

## 2017-12-28 NOTE — Progress Notes (Addendum)
Progress Note  Patient Name: Randall Callahan Date of Encounter: 12/28/2017  Primary Cardiologist: No primary care provider on file.  Subjective   No chest pain. Feeling well this morning.   Inpatient Medications    Scheduled Meds: . aspirin EC  81 mg Oral Q breakfast  . atorvastatin  40 mg Oral QHS  . clopidogrel  75 mg Oral Q breakfast  . insulin aspart  0-15 Units Subcutaneous TID WC  . insulin aspart  0-5 Units Subcutaneous QHS  . isosorbide mononitrate  30 mg Oral Daily  . lisinopril  2.5 mg Oral QPM  . metoprolol succinate  75 mg Oral QPC breakfast  . omega-3 acid ethyl esters  1 g Oral Daily  . ranolazine  500 mg Oral BID   Continuous Infusions:  PRN Meds: acetaminophen, ALPRAZolam, hydroxypropyl methylcellulose / hypromellose, nitroGLYCERIN, ondansetron (ZOFRAN) IV, zolpidem   Vital Signs    Vitals:   12/28/17 0111 12/28/17 0124 12/28/17 0439 12/28/17 0800  BP: (!) 92/48 (!) 97/52 (!) 97/50 (!) 109/56  Pulse:   65   Resp:      Temp:   97.8 F (36.6 C)   TempSrc:   Oral   SpO2:   94%   Weight:   192 lb (87.1 kg)   Height:        Intake/Output Summary (Last 24 hours) at 12/28/2017 1013 Last data filed at 12/28/2017 0759 Gross per 24 hour  Intake 829.13 ml  Output 725 ml  Net 104.13 ml   Filed Weights   12/26/17 0500 12/27/17 0530 12/28/17 0439  Weight: 194 lb 14.2 oz (88.4 kg) 195 lb 15.8 oz (88.9 kg) 192 lb (87.1 kg)    Telemetry    AV paced - Personally Reviewed  ECG    N/a - Personally Reviewed  Physical Exam   General: Well developed, well nourished, older W male appearing in no acute distress. Head: Normocephalic, atraumatic.  Neck: Supple without bruits, JVD. Lungs:  Resp regular and unlabored, CTA. Heart: RRR, S1, S2, no S3, S4, or murmur; no rub. Abdomen: Soft, non-tender, non-distended with normoactive bowel sounds.  Extremities: No clubbing, cyanosis, edema. Distal pedal pulses are 2+ bilaterally. Neuro: Alert and oriented X 3.  Moves all extremities spontaneously. Psych: Normal affect.  Labs    Chemistry Recent Labs  Lab 12/25/17 0755 12/26/17 0053  NA 137 139  K 4.2 4.3  CL 104 106  CO2 21* 23  GLUCOSE 195* 116*  BUN 13 16  CREATININE 1.03 1.05  CALCIUM 9.4 9.3  PROT  --  6.2*  ALBUMIN  --  3.4*  AST  --  17  ALT  --  12*  ALKPHOS  --  71  BILITOT  --  1.7*  GFRNONAA >60 >60  GFRAA >60 >60  ANIONGAP 12 10     Hematology Recent Labs  Lab 12/26/17 0053 12/27/17 0759 12/28/17 0431  WBC 7.4 6.5 6.6  RBC 4.59 4.59 4.52  HGB 13.1 13.3 13.0  HCT 39.7 39.4 39.2  MCV 86.5 85.8 86.7  MCH 28.5 29.0 28.8  MCHC 33.0 33.8 33.2  RDW 15.4 15.3 15.8*  PLT 229 219 217    Cardiac Enzymes Recent Labs  Lab 12/25/17 1507 12/25/17 2040 12/26/17 0208  TROPONINI 0.17* 0.14* 0.15*    Recent Labs  Lab 12/25/17 0806  TROPIPOC 0.04     BNP Recent Labs  Lab 12/25/17 0755  BNP 421.3*     DDimer No results for input(s): DDIMER in  the last 168 hours.    Radiology    No results found.  Cardiac Studies   N/a   Patient Profile     84 y.o. male with a hx of stents LAD & CFX 2003, bradycardia s/p StJ PPM, NSTEMI 08/2106>>med rx for 100% LAD, NSTEMI 09/2017 s/p DES RI w/ med rx for 100% LAD & CFX (consider CTO LAD if sx not controlled), CKD III, ICM w/ EF 30%, DM, HTN, HLD, GERD, periph neuropathy, who presented with chest pain.   Assessment & Plan    1. Chest pain: has a known occlusion of the LAD, LCx. Has been unable to tolerate higher doses of Imdur 2/2 to hypotension. Per Dr. Irish Lack could consider a CTO of LAD if refractory symptoms. Unable to do cardiac MRI given PPM. He was started on Ranexa this admission with symptoms improved.   2. ICM: EF of 35% via echo 12/18. Has been on ACEi and Toprol XL. No signs of volume overload on exam.  -- PPM interrogation noted in chart and adjusted by rep yesterday afternoon. Rates controlled in the 60-80s.   3. NIDDM: Metformin held on admission.  Has been on SSI  4. HTN: stable with some soft blood pressures at times. Asymptomatic.   Signed, Reino Bellis, NP  12/28/2017, 10:13 AM  Pager # [phone]   For questions or updates, please contact [place] Please consult [email or web address] for contact info under Cardiology/STEMI.   Patient seen and examined. Agree with assessment and plan. No recurrent chest pain on ranexa. Mild rare nonproductive cough according to daughter in law; probable ACE -I. Long discussion with daughter in law (present) and son (on phone).  Cath data reviewed.  Continue BB, lisinopril, isorsobide and Ranexa. Cr stable;  Paced rhythm; reviewed interrogation. No significant mode switches (<1%).  If recurrent increasing symptoms discussion of ?CTO, but favor initial increased medication approach.   Troy Sine, MD, [facility] 12/28/2017 10:53 AM

## 2017-12-29 LAB — GLUCOSE, CAPILLARY
GLUCOSE-CAPILLARY: 137 mg/dL — AB (ref 65–99)
Glucose-Capillary: 130 mg/dL — ABNORMAL HIGH (ref 65–99)
Glucose-Capillary: 159 mg/dL — ABNORMAL HIGH (ref 65–99)
Glucose-Capillary: 94 mg/dL (ref 65–99)

## 2017-12-29 LAB — CBC
HEMATOCRIT: 38.9 % — AB (ref 39.0–52.0)
HEMOGLOBIN: 13.1 g/dL (ref 13.0–17.0)
MCH: 28.9 pg (ref 26.0–34.0)
MCHC: 33.7 g/dL (ref 30.0–36.0)
MCV: 85.7 fL (ref 78.0–100.0)
Platelets: 239 10*3/uL (ref 150–400)
RBC: 4.54 MIL/uL (ref 4.22–5.81)
RDW: 15.6 % — ABNORMAL HIGH (ref 11.5–15.5)
WBC: 6.3 10*3/uL (ref 4.0–10.5)

## 2017-12-29 MED ORDER — RANOLAZINE ER 500 MG PO TB12
1000.0000 mg | ORAL_TABLET | Freq: Two times a day (BID) | ORAL | Status: DC
  Administered 2017-12-29 – 2017-12-30 (×2): 1000 mg via ORAL
  Filled 2017-12-29 (×2): qty 2

## 2017-12-29 NOTE — Progress Notes (Addendum)
Progress Note  Patient Name: Randall Callahan Date of Encounter: 12/29/2017  Primary Cardiologist: Kirk Ruths, MD   Subjective   Patient reports 2 episodes of loose stools yesterday evening without associated SOB, melena, or hematochezia. After cleaning himself up, he returned to bed around 7pm and noticed his HR was sustained in the 90s which is unusual for him. He then developed jaw tightness which lasted for ~30 minutes and resolved spontaneously. Daughter at bedside states when he has episodes of jaw tightness, which are his typical anginal complaints, that it usually foreshadows some form of invasive cardiac work-up.   Patient denies any overt chest pain. Has not had any recurrence of jaw tightness since last night. Denies SOB or palpitations.   Inpatient Medications    Scheduled Meds: . aspirin EC  81 mg Oral Q breakfast  . atorvastatin  40 mg Oral QHS  . clopidogrel  75 mg Oral Q breakfast  . insulin aspart  0-15 Units Subcutaneous TID WC  . insulin aspart  0-5 Units Subcutaneous QHS  . isosorbide mononitrate  30 mg Oral Daily  . lisinopril  2.5 mg Oral QPM  . metoprolol succinate  75 mg Oral QPC breakfast  . omega-3 acid ethyl esters  1 g Oral Daily  . ranolazine  500 mg Oral BID   Continuous Infusions:  PRN Meds: acetaminophen, ALPRAZolam, guaiFENesin-dextromethorphan, hydroxypropyl methylcellulose / hypromellose, nitroGLYCERIN, ondansetron (ZOFRAN) IV, zolpidem   Vital Signs    Vitals:   12/28/17 1316 12/28/17 2042 12/29/17 0414 12/29/17 0805  BP: (!) 99/57 (!) 104/59 (!) 103/53 100/62  Pulse: 70 85 75 81  Resp:      Temp: 98.2 F (36.8 C) 98.2 F (36.8 C) 98.8 F (37.1 C)   TempSrc: Oral Oral Oral   SpO2:  96% 95%   Weight:   191 lb (86.6 kg)   Height:        Intake/Output Summary (Last 24 hours) at 12/29/2017 1130 Last data filed at 12/29/2017 0739 Gross per 24 hour  Intake 440 ml  Output 1425 ml  Net -985 ml   Filed Weights   12/27/17 0530  12/28/17 0439 12/29/17 0414  Weight: 195 lb 15.8 oz (88.9 kg) 192 lb (87.1 kg) 191 lb (86.6 kg)    Telemetry    Paced rhythm with occasional PVCs - Personally Reviewed  ECG    Paced rhythm - Personally Reviewed  Physical Exam   GEN: Elderly gentleman laying in bed in no acute distress.   Neck: No JVD, no carotid bruits Cardiac: IRRR, no murmurs, rubs, or gallops.  Respiratory: Clear to auscultation bilaterally, no wheezes/ rales/ rhonchi GI: NABS, Soft, nontender, non-distended  MS: No edema; No deformity. Neuro:  Nonfocal, moving all extremities spontaneously Psych: Normal affect   Labs    Chemistry Recent Labs  Lab 12/25/17 0755 12/26/17 0053  NA 137 139  K 4.2 4.3  CL 104 106  CO2 21* 23  GLUCOSE 195* 116*  BUN 13 16  CREATININE 1.03 1.05  CALCIUM 9.4 9.3  PROT  --  6.2*  ALBUMIN  --  3.4*  AST  --  17  ALT  --  12*  ALKPHOS  --  71  BILITOT  --  1.7*  GFRNONAA >60 >60  GFRAA >60 >60  ANIONGAP 12 10     Hematology Recent Labs  Lab 12/27/17 0759 12/28/17 0431 12/29/17 0404  WBC 6.5 6.6 6.3  RBC 4.59 4.52 4.54  HGB 13.3 13.0 13.1  HCT  39.4 39.2 38.9*  MCV 85.8 86.7 85.7  MCH 29.0 28.8 28.9  MCHC 33.8 33.2 33.7  RDW 15.3 15.8* 15.6*  PLT 219 217 239    Cardiac Enzymes Recent Labs  Lab 12/25/17 1507 12/25/17 2040 12/26/17 0208  TROPONINI 0.17* 0.14* 0.15*    Recent Labs  Lab 12/25/17 0806  TROPIPOC 0.04     BNP Recent Labs  Lab 12/25/17 0755  BNP 421.3*     DDimer No results for input(s): DDIMER in the last 168 hours.   Radiology    No results found.  Cardiac Studies   None  Patient Profile     84 y.o. male with a hx of stents LAD & CFX 2003, bradycardia s/p StJ PPM, NSTEMI 08/2106>>med rx for 100% LAD, NSTEMI 09/2017 s/p DES RI w/ med rx for 100% LAD & CFX (consider CTO LAD if sx not controlled), CKD III, ICM w/ EF 30%, DM, HTN, HLD, GERD, periph neuropathy, who presented with chest pain.   Assessment & Plan    1.  Chest pain: has a known occlusion of the LAD, LCx. Has been unable to tolerate higher doses of Imdur 2/2 to hypotension. Per Dr. Irish Lack could consider a CTO of LAD if refractory symptoms. Unable to do cardiac MRI given PPM. He was started on Ranexa this admission although patient is unsure if this is improving his symptoms. One episode of jaw tightness overnight lasting 30 minutes while HR sustained in the 90s, however without associated symptoms and resolved spontaneously. Family/patient continue to feel that surgical intervention is necessary to improve symptoms. - Continue Ranexa for now at current dose - Continue ASA, plavix, statin, and imdur  2. ICM: EF of 35% via echo 12/18. Has been on ACEi and Toprol XL. No signs of volume overload on exam.  - PPM interrogation noted in chart and adjusted by rep 3/3. Rates controlled in the 60-80s.  - Continue Bblocker, ACEi, and imdur  3. NIDDM: Metformin held on admission.  - Continue SSI  4. HTN: stable with some soft blood pressures at times. Asymptomatic. - Continue Bblocker, ACEi, and imdur   For questions or updates, please contact [facility] Please consult [email or web address] for contact info under Cardiology/STEMI.      Signed, Abigail Butts, PA-C  12/29/2017, 11:30 AM   [phone]   Patient seen and examined. Agree with assessment and plan. Pt has received 9 doses of ranexa and should be steady state pharmacokinetically. BP has limited further titration of imdur of BB.  Will titrate ranexa to 1000 mg bid since  Cr Clearance 53 and not < 30.    Ambulate. ? Dc 24 - 48 hrs and early f/u next week in office.  Discussed with pt and daughter-in-law.   Troy Sine, MD, [facility] 12/29/2017 1:35 PM

## 2017-12-30 ENCOUNTER — Encounter (HOSPITAL_COMMUNITY): Payer: Medicare Other

## 2017-12-30 LAB — CBC
HEMATOCRIT: 40.8 % (ref 39.0–52.0)
HEMOGLOBIN: 13.6 g/dL (ref 13.0–17.0)
MCH: 28.7 pg (ref 26.0–34.0)
MCHC: 33.3 g/dL (ref 30.0–36.0)
MCV: 86.1 fL (ref 78.0–100.0)
Platelets: 236 10*3/uL (ref 150–400)
RBC: 4.74 MIL/uL (ref 4.22–5.81)
RDW: 15.6 % — ABNORMAL HIGH (ref 11.5–15.5)
WBC: 6.6 10*3/uL (ref 4.0–10.5)

## 2017-12-30 LAB — GLUCOSE, CAPILLARY
GLUCOSE-CAPILLARY: 122 mg/dL — AB (ref 65–99)
GLUCOSE-CAPILLARY: 156 mg/dL — AB (ref 65–99)

## 2017-12-30 MED ORDER — GLUCOSE BLOOD VI STRP: 1.0000 | ORAL_STRIP | Freq: Every day | 0 refills | Status: AC

## 2017-12-30 MED ORDER — RANOLAZINE ER 1000 MG PO TB12: 1000.0000 mg | ORAL_TABLET | Freq: Two times a day (BID) | ORAL | 1 refills | Status: AC

## 2017-12-30 MED ORDER — METFORMIN HCL 1000 MG PO TABS: 1000.0000 mg | ORAL_TABLET | Freq: Two times a day (BID) | ORAL | 0 refills | Status: AC

## 2017-12-30 NOTE — Discharge Summary (Addendum)
Discharge Summary    Patient ID: Randall Callahan,  MRN: [id number], DOB/AGE: 03-31-1928 84 y.o.  Admit date: 12/25/2017 Discharge date: 12/30/2017  Primary Care Provider: Dineen Kid Primary Cardiologist: Dr. Stanford Breed  Discharge Diagnoses    Principal Problem:   Angina pectoris, unstable ([place]) Active Problems:   Ischemic cardiomyopathy   Non-insulin treated type 2 diabetes mellitus ([place])   Hypertension   CKD (chronic kidney disease), stage III ([place])   Unstable angina ([place])   Allergies No Known Allergies  Diagnostic Studies/Procedures    N/a  _____________   History of Present Illness     84 y.o. male with a hx of stents LAD & CFX 2003, bradycardia s/p StJ PPM, NSTEMI 08/2106>>med rx for 100% LAD, NSTEMI 09/2017 s/p DES RI w/ med rx for 100% LAD & CFX (consider CTO LAD if sx not controlled), CKD III, ICM w/ EF 30%, DM, HTN, HLD, GERD, periph neuropathy, who presented to the ED with chest pain.  Randall Callahan was feeling poorly the evening prior to admission, his HR had been elevated >100 and SBP was 90s. He had no energy.  He felt worse the following morning, had jaw discomfort had started during the night, felt this was his angina. He feels lying down made it worse, walking around made it worse. He called 911 at 3 am, sat up for a while and felt better, did not go to the ER. Later that morning he presented to the ED.  He felt his normal weight is 190, does not weigh (cannot see the numbers).   He gets tired easily. In general, denies PND but has nocturia. Denies orthopnea chronically, but felt worse lying down last night. No LE edema.  Denies recent sx and did 2 cardiac rehab sessions this week without problems.   However, he admits his stamina is poor, worse than it used to be. This seems to be a gradual thing, unclear if it started in December after his last MI.  Hospital Course     He was admitted for further work up. Started on Ranexa, and continued on  lisinopril and Toprol XL. Placed on IV heparin. Troponins cycled and peaked at 0.15. He tolerated the Ranexa well but was concerned about his soft blood pressures. Did have several episodes of tachycardia on telemetry. Seen by the PPM rep and device interrogated with minor adjustments made. Did have a recurrent episode of jaw tightness and his Ranexa was further titrated up to 1000mg  BID. Unable to further titrate up his Imdur given soft blood pressures. BB therapy was continue the same.  He was able to ambulate several times in the hallway without recurrent chest pain. Plan at this time will be to continue with medical therapy and reassess symptoms at follow up appt. Have arranged for early follow up in the office next week.   General: Well developed, well nourished, male appearing in no acute distress. Head: Normocephalic, atraumatic.  Neck: Supple without bruits, JVD. Lungs:  Resp regular and unlabored, CTA. Heart: RRR, S1, S2, no S3, S4, or murmur; no rub. Abdomen: Soft, non-tender, non-distended with normoactive bowel sounds. No hepatomegaly. No rebound/guarding. No obvious abdominal masses. Extremities: No clubbing, cyanosis, edema. Distal pedal pulses are 2+ bilaterally Neuro: Alert and oriented X 3. Moves all extremities spontaneously. Psych: Normal affect.  Randall Callahan was seen by Dr. Claiborne Billings and determined stable for discharge home. Follow up in the office has been arranged. Medications are listed below.   _____________  Discharge Vitals  Blood pressure (!) 109/58, pulse 77, temperature 98.6 F (37 C), temperature source Oral, resp. rate 18, height 6' (1.829 m), weight 190 lb 11.2 oz (86.5 kg), SpO2 95 %.  Filed Weights   12/28/17 0439 12/29/17 0414 12/30/17 0600  Weight: 192 lb (87.1 kg) 191 lb (86.6 kg) 190 lb 11.2 oz (86.5 kg)    Labs & Radiologic Studies    CBC Recent Labs    12/29/17 0404 12/30/17 0459  WBC 6.3 6.6  HGB 13.1 13.6  HCT 38.9* 40.8  MCV 85.7 86.1    PLT 239 408   Basic Metabolic Panel No results for input(s): NA, K, CL, CO2, GLUCOSE, BUN, CREATININE, CALCIUM, MG, PHOS in the last 72 hours. Liver Function Tests No results for input(s): AST, ALT, ALKPHOS, BILITOT, PROT, ALBUMIN in the last 72 hours. No results for input(s): LIPASE, AMYLASE in the last 72 hours. Cardiac Enzymes No results for input(s): CKTOTAL, CKMB, CKMBINDEX, TROPONINI in the last 72 hours. BNP Invalid input(s): POCBNP D-Dimer No results for input(s): DDIMER in the last 72 hours. Hemoglobin A1C No results for input(s): HGBA1C in the last 72 hours. Fasting Lipid Panel No results for input(s): CHOL, HDL, LDLCALC, TRIG, CHOLHDL, LDLDIRECT in the last 72 hours. Thyroid Function Tests No results for input(s): TSH, T4TOTAL, T3FREE, THYROIDAB in the last 72 hours.  Invalid input(s): FREET3 _____________  Dg Chest 2 View  Result Date: 12/25/2017 CLINICAL DATA:  Bradycardia EXAM: CHEST  2 VIEW COMPARISON:  October 02, 2017 FINDINGS: Lungs are hyperexpanded with scarring in the lower lung zones. There is no appreciable edema or consolidation. Heart size and pulmonary vascularity are within normal limits. Pacemaker leads are attached the right atrium and right ventricle. There is degenerative change in the thoracic spine and shoulders. IMPRESSION: Lungs hyperexpanded with lower lobe scarring. No edema or consolidation. Heart size normal. No adenopathy evident. Pacemaker leads attached to right atrium and right ventricle. Electronically Signed   By: Lowella Grip III M.D.   On: 12/25/2017 08:09   Disposition   Pt is being discharged home today in good condition.  Follow-up Plans & Appointments    Follow-up Information    Almyra Deforest, [place] Follow up on 01/05/2018.   Specialties:  Cardiology, Radiology Why:  at 11am for your follow up appt.  Contact information: [address] [place] [address] [phone]          Discharge Instructions     Diet - low sodium heart healthy   Complete by:  As directed    Increase activity slowly   Complete by:  As directed       Discharge Medications     Medication List    TAKE these medications   aspirin EC 81 MG tablet Take 81 mg by mouth daily with breakfast.   atorvastatin 40 MG tablet Commonly known as:  LIPITOR Take 1 tablet (40 mg total) by mouth at bedtime.   BENEFIBER Powd Take 15 mLs by mouth daily as needed (FOR FIBER).   bifidobacterium infantis capsule Take 1 capsule by mouth daily.   clopidogrel 75 MG tablet Commonly known as:  PLAVIX Take 1 tablet (75 mg total) by mouth daily with breakfast.   FISH OIL PO Take 300 mg by mouth every evening.   glucose blood test strip Commonly known as:  [employer] TEST STRIPS 1 each by Other route daily. What changed:  how to take this   hydroxypropyl methylcellulose / hypromellose 2.5 % ophthalmic solution Commonly known  as:  ISOPTO TEARS / GONIOVISC Place 2 drops into the right eye as needed for dry eyes.   isosorbide mononitrate 60 MG 24 hr tablet Commonly known as:  IMDUR Take 0.5 tablets (30 mg total) by mouth daily.   lisinopril 2.5 MG tablet Commonly known as:  PRINIVIL,ZESTRIL Take 1 tablet (2.5 mg total) by mouth every evening.   metFORMIN 1000 MG tablet Commonly known as:  GLUCOPHAGE Take 1 tablet (1,000 mg total) by mouth 2 (two) times daily with a meal.   metoprolol succinate 25 MG 24 hr tablet Commonly known as:  TOPROL-XL Take 3 tablets (75 mg total) by mouth daily after breakfast. Take with or immediately following a meal.   nitroGLYCERIN 0.4 MG SL tablet Commonly known as:  NITROSTAT Place 1 tablet (0.4 mg total) under the tongue every 5 (five) minutes as needed for chest pain. Up to 3 doses for chest pain. If pain has not subsided after 3 doses go to the ER.   ranolazine 1000 MG SR tablet Commonly known as:  RANEXA Take 1 tablet (1,000 mg total) by mouth 2 (two) times daily.       Outstanding  Labs/Studies   N/a   Duration of Discharge Encounter   Greater than 30 minutes including physician time.  Signed, Reino Bellis NP-C 12/30/2017, 1:17 PM    Patient seen and examined. Agree with assessment and plan. The patient feels well.  He has not had any recurrent chest pain.  Ranexa has been titrate and  tolerated at 1000 mg twice a day.  Renal function is stable with a creatinine clearance greater than 60.  He was able to walk today without recurrent symptoms.blood pressure is stable on low-dose disorder by to 30 mg, lisinopril 2.5 mg, and Toprol-XL 75 mg.  Okay to discharge today with close follow-up early next week in the office.   Troy Sine, MD, [facility] 12/30/2017 1:37 PM

## 2017-12-30 NOTE — Care Management Important Message (Signed)
Important Message  Patient Details  Name: Randall Callahan MRN: [id number] Date of Birth: 21-Jun-1928   Medicare Important Message Given:  Yes    Chatham Howington 12/30/2017, 10:27 AM

## 2017-12-31 ENCOUNTER — Telehealth: Payer: Self-pay | Admitting: Cardiology

## 2017-12-31 NOTE — Telephone Encounter (Signed)
Received page from patient and patient's daughter-in-law.  He was just discharged from the hospital yesterday after admission for an exacerbation of stable chronic chronic angina.  His medicines were titrated and Ranexa was added.  He had a brief episode of substernal chest discomfort earlier today while walking that resolved with rest and nitroglycerin within 1-2 minutes.  He reported that his blood pressure was 100s over 70s and his heart rates were as high as 118 but have come back down to the 70s.  He currently denies any chest pain, shortness of breath, presyncope, or any frank syncopal events.  The patient and his daughter-in-law know to seek emergency care if he has a sustained episode of chest pain, shortness of breath, presyncope, or syncope.  Otherwise, they will follow-up with the office as per schedule.

## 2018-01-01 ENCOUNTER — Telehealth: Payer: Self-pay | Admitting: Cardiology

## 2018-01-01 ENCOUNTER — Encounter (HOSPITAL_COMMUNITY): Payer: Medicare Other

## 2018-01-01 NOTE — Telephone Encounter (Signed)
Spoke with Randall Callahan daughter-in-law, patient had an episode last night of CP, a little short of breath,  and elevated HR around 2-3 am and he took NTG and subsided. Blood pressures normal limits.  Per Randall Callahan patient seems to do better when he is sitting up. Has had some cough and had while in hospital was given Robitussin which helped. When he coughs at home she offers Robitussin and he declines. Denies any swelling. Today he has felt fine but she just wanted to make sure that she is doing the right things. Patient has appointment with Randall Ridge PA on 3/12. Per Randall Callahan patient does not feel during these episodes he needs to go to ED. Advised to continue current medications, go to ED if worse, avoid salt, try sleeping with head elevated, and keep follow up. Will forward to Dr Stanford Breed for review

## 2018-01-01 NOTE — Telephone Encounter (Signed)
Agree Randall Callahan  

## 2018-01-01 NOTE — Telephone Encounter (Signed)
F/U Call:  Patient daughter [name]) calling, states that she spoke with on-call doctor last night and states that patient is still not feeling well so she wanted some advice before the weekend.  Patient took another Nitro this morning, had BP spike and felt a little clammy.

## 2018-01-04 ENCOUNTER — Encounter (HOSPITAL_COMMUNITY): Payer: Medicare Other

## 2018-01-05 ENCOUNTER — Encounter: Payer: Self-pay | Admitting: Physician Assistant

## 2018-01-05 ENCOUNTER — Ambulatory Visit (INDEPENDENT_AMBULATORY_CARE_PROVIDER_SITE_OTHER): Payer: Medicare Other | Admitting: Physician Assistant

## 2018-01-05 VITALS — BP 98/50 | HR 77 | Ht 72.0 in | Wt 190.6 lb

## 2018-01-05 DIAGNOSIS — N182 Chronic kidney disease, stage 2 (mild): Secondary | ICD-10-CM | POA: Diagnosis not present

## 2018-01-05 DIAGNOSIS — I255 Ischemic cardiomyopathy: Secondary | ICD-10-CM | POA: Diagnosis not present

## 2018-01-05 DIAGNOSIS — I2510 Atherosclerotic heart disease of native coronary artery without angina pectoris: Secondary | ICD-10-CM | POA: Diagnosis not present

## 2018-01-05 DIAGNOSIS — Z950 Presence of cardiac pacemaker: Secondary | ICD-10-CM | POA: Diagnosis not present

## 2018-01-05 DIAGNOSIS — E119 Type 2 diabetes mellitus without complications: Secondary | ICD-10-CM | POA: Diagnosis not present

## 2018-01-05 DIAGNOSIS — I200 Unstable angina: Secondary | ICD-10-CM | POA: Diagnosis not present

## 2018-01-05 MED ORDER — PANTOPRAZOLE SODIUM 40 MG PO TBEC: 40.0000 mg | DELAYED_RELEASE_TABLET | Freq: Every day | ORAL | 11 refills | Status: AC

## 2018-01-05 NOTE — Progress Notes (Signed)
Cardiology Office Note    Date:  01/05/2018   ID:  Randall Callahan, DOB 06/24/1928, MRN [id number]  PCP:  Dineen Kid, MD  Cardiologist: Dr. Stanford Breed Electrophysiologist: Dr. Curt Bears  Chief Complaint  Patient presents with  . Hospitalization Follow-up    pt c/o chest discomfort and indigestion    History of Present Illness:  Randall Callahan is a 84 y.o. male with PMH of CAD, ICM, SSS s/p PPM, CKD stage II and DM 2.  He has a history of occluded LAD by cath in 2008.  He has a Engineer, petroleum placed in 2015.  He had a NSTEMI in November 2017 that was treated medically.  Cardiac catheterization showed totally occluded LAD, 99% stenosis of his left circumflex and a moderate stenosis in the ramus branch.  Repeat cardiac catheterization in December 2018 showed severe two-vessel CAD with chronically occluded proximal to mid LAD at the distal segment of previously placed stent with faint left to left and right-to-left collaterals and occluded proximal left circumflex at the previously placed stent with right to left collaterals.  Severe disease in large ramus.  Mild disease in the RCA, EF 30%.  CVTS was consulted and felt he is too high of risk for CABG.  He eventually underwent DES to ramus intermedius on 10/02/2017 with possibility of consideration for CTO of LAD if he has recurrent symptoms refractory to medical therapy.   He was recently admitted on 12/25/2017 for unstable angina, Ranexa was added.  He presents today for evaluation of recurrent chest pain.  Since his cardiac catheterization in December, his symptom actually improved for roughly 3-4 weeks afterwards.  However it came back since then.  He is having chest pain every other day.  It mainly occurs with exertion, very rarely it also occurs after a meal.  I will start him on Protonix 40 mg daily.  He says after he increased on the Ranexa dose, he actually felt worse.  He does not have any lower extremity edema, orthopnea or PND.  He has  lost significant amount of weight since last year.  I have discussed the case with his primary cardiologist Dr. Stanford Breed, given the recurrent chest pain, we recommended cardiac catheterization to reevaluate the stent that was placed in the ramus artery, and potentially consider CTO procedure given persistent symptoms.   Past Medical History:  Diagnosis Date  . Angina pectoris, unstable ([place])   . Angiomyolipoma of kidney   . Arthritis    "hips and knees" (09/29/2017)  . ASCVD (arteriosclerotic cardiovascular disease)   . Bradycardia   . Cancer [facility]) 2002   "iliac crest"  . Cancer of skin, face    "several" (09/29/2017)  . Cardiomyopathy ([place])   . Chest pain   . Chest tightness   . Chronic lower back pain   . CKD (chronic kidney disease), stage III ([place])    [name] 09/29/2017  . Complication of anesthesia    "my urinary tract becomes irritated; takes ~ 3-4 weeks to go away" (09/29/2017)  . Coronary artery disease   . Diverticulosis   . GERD (gastroesophageal reflux disease)   . High cholesterol   . Hx of sick sinus syndrome   . Hypertension   . MCI (mild cognitive impairment)   . MI (myocardial infarction) ([place]) 2003; ~ 12/2016;   . NSTEMI (non-ST elevation myocardial infarction) ([place]) 08/2016   [name] 09/29/2017  . Peripheral neuropathy   . Pneumonia 2000s X ?2  . Presence of permanent cardiac pacemaker  2015  . Schatzki's ring   . Squamous cell carcinoma in situ of skin   . Type II diabetes mellitus ([place])     Past Surgical History:  Procedure Laterality Date  . CARDIAC CATHETERIZATION  >2003 X 2   "couldn't unblock the stents that were blocked"  . CATARACT EXTRACTION    . CATARACT EXTRACTION W/ INTRAOCULAR LENS IMPLANT Right 2000s  . COLONOSCOPY W/ BIOPSIES AND POLYPECTOMY    . CORONARY ANGIOGRAPHY    . CORONARY ANGIOPLASTY WITH STENT PLACEMENT  2003   "2 stents"  . CORONARY STENT INTERVENTION N/A 10/02/2017   Procedure: CORONARY STENT INTERVENTION;  Surgeon: Jettie Booze, MD;  Location: [place] [facility];  Service: Cardiovascular;  Laterality: N/A;  . ESOPHAGOGASTRODUODENOSCOPY    . EXCISIONAL HEMORRHOIDECTOMY    . INSERT / REPLACE / REMOVE PACEMAKER  2015   [name] 09/29/2017  . JOINT REPLACEMENT    . LEFT HEART CATH N/A 10/02/2017   Procedure: Left Heart Cath;  Surgeon: Jettie Booze, MD;  Location: [place] [facility];  Service: Cardiovascular;  Laterality: N/A;  . LEFT HEART CATH AND CORONARY ANGIOGRAPHY N/A 09/30/2017   Procedure: LEFT HEART CATH AND CORONARY ANGIOGRAPHY;  Surgeon: Wellington Hampshire, MD;  Location: [facility];  Service: Cardiovascular;  Laterality: N/A;  . MASS EXCISION Right 2002   "iliac crest cancer"  . SKIN CANCER EXCISION     "facial"  . TONSILLECTOMY    . TOTAL KNEE ARTHROPLASTY Left ~ 2012  . VASCULAR SURGERY      Current Medications: Outpatient Medications Prior to Visit  Medication Sig Dispense Refill  . aspirin EC 81 MG tablet Take 81 mg by mouth daily with breakfast.     . atorvastatin (LIPITOR) 40 MG tablet Take 1 tablet (40 mg total) by mouth at bedtime. 30 tablet 0  . bifidobacterium infantis (ALIGN) capsule Take 1 capsule by mouth daily.    . clopidogrel (PLAVIX) 75 MG tablet Take 1 tablet (75 mg total) by mouth daily with breakfast. 30 tablet 0  . glucose blood ([employer] TEST STRIPS) test strip 1 each by Other route daily. 50 each 0  . hydroxypropyl methylcellulose / hypromellose (ISOPTO TEARS / GONIOVISC) 2.5 % ophthalmic solution Place 2 drops into the right eye as needed for dry eyes.    . isosorbide mononitrate (IMDUR) 60 MG 24 hr tablet Take 0.5 tablets (30 mg total) by mouth daily. 30 tablet 2  . lisinopril (PRINIVIL,ZESTRIL) 2.5 MG tablet Take 1 tablet (2.5 mg total) by mouth every evening. 30 tablet 0  . metFORMIN (GLUCOPHAGE) 1000 MG tablet Take 1 tablet (1,000 mg total) by mouth 2 (two) times daily with a meal. 60 tablet 0  . metoprolol succinate (TOPROL-XL) 25 MG 24 hr tablet Take 3  tablets (75 mg total) by mouth daily after breakfast. Take with or immediately following a meal. 270 tablet 1  . nitroGLYCERIN (NITROSTAT) 0.4 MG SL tablet Place 1 tablet (0.4 mg total) under the tongue every 5 (five) minutes as needed for chest pain. Up to 3 doses for chest pain. If pain has not subsided after 3 doses go to the ER. 30 tablet 1  . Omega-3 Fatty Acids (FISH OIL PO) Take 300 mg by mouth every evening.     . ranolazine (RANEXA) 1000 MG SR tablet Take 1 tablet (1,000 mg total) by mouth 2 (two) times daily. 60 tablet 1  . Wheat Dextrin (BENEFIBER) POWD Take 15 mLs by mouth daily  as needed (FOR FIBER).     No facility-administered medications prior to visit.      Allergies:   Patient has no known allergies.   Social History   Socioeconomic History  . Marital status: Divorced    Spouse name: None  . Number of children: 6  . Years of education: some college  . Highest education level: None  Social Needs  . Financial resource strain: None  . Food insecurity - worry: None  . Food insecurity - inability: None  . Transportation needs - medical: None  . Transportation needs - non-medical: None  Occupational History  . Occupation: Retired  Tobacco Use  . Smoking status: Former Smoker    Years: 2.00    Types: Cigarettes  . Smokeless tobacco: Never Used  . Tobacco comment: STARTED SMOKING IN 4TH GRADE, QUIT IN 6TH  Substance and Sexual Activity  . Alcohol use: Yes    Alcohol/week: 0.6 oz    Types: 1 Cans of beer per week  . Drug use: No  . Sexual activity: No  Other Topics Concern  . None  Social History Narrative   Lives at home with son and daughter-in-law.   Right-handed.   2 cups caffeine per day.     Family History:  The patient's family history includes Angina in his mother; Cancer in his brother; Macular degeneration in his sister; Other in his father; Prostate cancer in his brother; Stroke in his mother.   ROS:   Please see the history of present illness.      ROS All other systems reviewed and are negative.   PHYSICAL EXAM:   VS:  BP (!) 98/50   Pulse 77   Ht 6' (1.829 m)   Wt 190 lb 9.6 oz (86.5 kg)   BMI 25.85 kg/m    GEN: Well nourished, well developed, in no acute distress  HEENT: normal  Neck: no JVD, carotid bruits, or masses Cardiac: RRR; no murmurs, rubs, or gallops,no edema  Respiratory:  clear to auscultation bilaterally, normal work of breathing GI: soft, nontender, nondistended, + BS MS: no deformity or atrophy  Skin: warm and dry, no rash Neuro:  Alert and Oriented x 3, Strength and sensation are intact Psych: euthymic mood, full affect  Wt Readings from Last 3 Encounters:  01/05/18 190 lb 9.6 oz (86.5 kg)  12/30/17 190 lb 11.2 oz (86.5 kg)  12/15/17 197 lb 12 oz (89.7 kg)      Studies/Labs Reviewed:   EKG:  EKG is ordered today.  The ekg ordered today demonstrates normal sinus rhythm with left bundle branch block.  Recent Labs: 09/29/2017: TSH 1.137 12/25/2017: B Natriuretic Peptide 421.3 12/26/2017: ALT 12; BUN 16; Creatinine, Ser 1.05; Potassium 4.3; Sodium 139 12/30/2017: Hemoglobin 13.6; Platelets 236   Lipid Panel No results found for: CHOL, TRIG, HDL, CHOLHDL, VLDL, LDLCALC, LDLDIRECT  Additional studies/ records that were reviewed today include:   Cath 10/02/2017 Conclusion     Prox LAD lesion is 100% stenosed.  Ost Cx to Prox Cx lesion is 100% stenosed.  Prox RCA lesion is 30% stenosed. Noted on prior cath. Right to left collaterals.  Ost Ramus to mid Ramus lesion is 90% stenosed.  A drug-eluting stent was successfully placed using a STENT SIERRA 3.00 X 38 MM, treating the mid to distal disease, postdilated to 3.4-3.5 mm using the 3.25 stent balloon and the 3.5 balloon more proximally.  A drug-eluting stent was successfully placed using a STENT SIERRA 3.25 X 12 MM,  treating the proximal ramus, postdilated to 3.5 mm.  Post intervention, there is a 0% residual stenosis.  LV end diastolic  pressure is normal.  There is no aortic valve stenosis.   Continue dual antiplatelet therapy for at least one year.   Could consider CTO PCI of the LAD in the future if this area is viable and he had refractory symptoms.       ASSESSMENT:    1. Progressive angina ([place])   2. Coronary artery disease involving native coronary artery of native heart without angina pectoris   3. Ischemic cardiomyopathy   4. Pacemaker   5. CKD (chronic kidney disease), stage II   6. Controlled type 2 diabetes mellitus without complication, without long-term current use of insulin (H[facility])      PLAN:  In order of problems listed above:  1. Progressive angina: After his stent placement in December 2018, he did have 3-4 weeks of improvement in the symptoms.  However his chest pain has recurred since.  He was most recently admitted for chest pain, his troponin was borderline elevated however remained flat.  Since discharge, the Ranexa has not improved his symptom at all.  He continued to have chest pain every other day.  It is mostly occurring with exertion however rarely also occurs after he eats food.  I added Protonix 40 mg daily to his medical regimen.  I have discussed various options with the patient and also with Dr. Stanford Breed, we recommend repeat cardiac catheterization to reassess his ramus stents and also consideration of CTO PCI of LAD.  I have plan to arrange his procedure with Dr. Irish Lack who did his procedure the last time.  - Risk and benefit of procedure explained to the patient who display clear understanding and agree to proceed.  Discussed with patient possible procedural risk include bleeding, vascular injury, renal injury, arrythmia, MI, stroke and loss of limb or life.  2. CAD: Last PCI was in December 2018, DES to ramus.  Chronically occluded left circumflex and LAD.  3. Ischemic cardiomyopathy: Baseline EF 30-35% by echocardiogram in December 2018.  4. SSS s/p PPM: Follows Dr.  Curt Bears.  5. DM 2: Managed by primary care provider    Medication Adjustments/Labs and Tests Ordered: Current medicines are reviewed at length with the patient today.  Concerns regarding medicines are outlined above.  Medication changes, Labs and Tests ordered today are listed in the Patient Instructions below. Patient Instructions  Medication Instructions:  START Protonix 40mg  take 1 tablet once a day   Labwork: Your physician recommends that you schedule a follow-up appointment in: Thursday -CBC, BMP, PT/INR  Testing/Procedures: Your physician has requested that you have a cardiac catheterization. Cardiac catheterization is used to diagnose and/or treat various heart conditions. Doctors may recommend this procedure for a number of different reasons. The most common reason is to evaluate chest pain. Chest pain can be a symptom of coronary artery disease (CAD), and cardiac catheterization can show whether plaque is narrowing or blocking your heart's arteries. This procedure is also used to evaluate the valves, as well as measure the blood flow and oxygen levels in different parts of your heart. For further information please visit HugeFiesta.tn. Please follow instruction sheet, as given.  SCHEDULED 01/12/2018 WITH DR Irish Lack   Follow-Up: Your physician recommends that you schedule a follow-up appointment in: 4 WEEKS with Almyra Deforest, PA-C  Any Other Special Instructions Will Be Listed Below (If Applicable).  If you need a refill on your cardiac medications  before your next appointment, please call your pharmacy.      [facility] [address] [place] [address] Dept: [phone] Loc: [phone]  Esvin Hnat  01/05/2018  You are scheduled for a Cardiac Catheterization on Tuesday, March 19 with Dr. Larae Grooms.  1. Please arrive at the [facility] (Main Entrance A) at  [facility]: [address] [place], [place] [address] at 5:30 AM (two hours before your procedure to ensure your preparation). Free valet parking service is available.   Special note: Every effort is made to have your procedure done on time. Please understand that emergencies sometimes delay scheduled procedures.  2. Diet: Do not eat or drink anything after midnight prior to your procedure except sips of water to take medications.  3. Labs: You will need to have blood drawn on Thursday, March 14 at [facility] in our office [place] (Lunch 12:30 - 1:30)   Phone: [phone]. You do not need to be fasting.  4. Medication instructions in preparation for your procedure:   Current Outpatient Medications (Endocrine & Metabolic):  .  metFORMIN (GLUCOPHAGE) 1000 MG tablet, Take 1 tablet (1,000 mg total) by mouth 2 (two) times daily with a meal.  Current Outpatient Medications (Cardiovascular):  .  atorvastatin (LIPITOR) 40 MG tablet, Take 1 tablet (40 mg total) by mouth at bedtime. .  isosorbide mononitrate (IMDUR) 60 MG 24 hr tablet, Take 0.5 tablets (30 mg total) by mouth daily. [name]  lisinopril (PRINIVIL,ZESTRIL) 2.5 MG tablet, Take 1 tablet (2.5 mg total) by mouth every evening. .  metoprolol succinate (TOPROL-XL) 25 MG 24 hr tablet, Take 3 tablets (75 mg total) by mouth daily after breakfast. Take with or immediately following a meal. .  nitroGLYCERIN (NITROSTAT) 0.4 MG SL tablet, Place 1 tablet (0.4 mg total) under the tongue every 5 (five) minutes as needed for chest pain. Up to 3 doses for chest pain. If pain has not subsided after 3 doses go to the ER. .  ranolazine (RANEXA) 1000 MG SR tablet, Take 1 tablet (1,000 mg total) by mouth 2 (two) times daily.   Current Outpatient Medications (Analgesics):  .  aspirin EC 81 MG tablet, Take 81 mg by mouth daily with breakfast.   Current Outpatient Medications (Hematological):  .  clopidogrel (PLAVIX) 75  MG tablet, Take 1 tablet (75 mg total) by mouth daily with breakfast.  Current Outpatient Medications (Other):  .  bifidobacterium infantis (ALIGN) capsule, Take 1 capsule by mouth daily. [name]  glucose blood ([employer] TEST STRIPS) test strip, 1 each by Other route daily. .  hydroxypropyl methylcellulose / hypromellose (ISOPTO TEARS / GONIOVISC) 2.5 % ophthalmic solution, Place 2 drops into the right eye as needed for dry eyes. .  Omega-3 Fatty Acids (FISH OIL PO), Take 300 mg by mouth every evening.  .  Wheat Dextrin (BENEFIBER) POWD, Take 15 mLs by mouth daily as needed (FOR FIBER). *For reference purposes while preparing patient instructions.   Delete this med list prior to printing instructions for patient.*    Stop taking, Glucophage (Metformin) on Monday, March 18. AND RESUME 48 HOURS AFTER CATH  On the morning of your procedure, take your Aspirin and any morning medicines NOT listed above(METFORMIN).  You may use sips of water.  5. Plan for one night stay--bring personal belongings. 6. Bring a current list of your medications and current insurance cards. 7. You  MUST have a responsible person to drive you home. 8. Someone MUST be with you the first 24 hours after you arrive home or your discharge will be delayed. 9. Please wear clothes that are easy to get on and off and wear slip-on shoes.  Thank you for allowing [place] to care for you!   -- [facility] Invasive Cardiovascular services     Signed, Almyra Deforest, [place]  01/05/2018 1:44 PM    [facility] [place], [place], [place]  [address] Phone: [phone]; Fax: [phone]

## 2018-01-05 NOTE — Patient Instructions (Addendum)
Medication Instructions:  START Protonix 40mg  take 1 tablet once a day   Labwork: Your physician recommends that you schedule a follow-up appointment in: Thursday -CBC, BMP, PT/INR  Testing/Procedures: Your physician has requested that you have a cardiac catheterization. Cardiac catheterization is used to diagnose and/or treat various heart conditions. Doctors may recommend this procedure for a number of different reasons. The most common reason is to evaluate chest pain. Chest pain can be a symptom of coronary artery disease (CAD), and cardiac catheterization can show whether plaque is narrowing or blocking your heart's arteries. This procedure is also used to evaluate the valves, as well as measure the blood flow and oxygen levels in different parts of your heart. For further information please visit HugeFiesta.[email or web address]. Please follow instruction sheet, as given.  SCHEDULED 01/12/2018 WITH DR Irish Lack   Follow-Up: Your physician recommends that you schedule a follow-up appointment in: 4 WEEKS with Randall Deforest, PA-C  Any Other Special Instructions Will Be Listed Below (If Applicable).  If you need a refill on your cardiac medications before your next appointment, please call your pharmacy.      [facility] [address] [place] [address] Dept: [phone] Loc: [phone]  Randall Callahan  01/05/2018  You are scheduled for a Cardiac Catheterization on Tuesday, March 19 with Dr. Larae Grooms.  1. Please arrive at the [facility] (Main Entrance A) at [facility]: [address] [place], [place] [address] at 5:30 AM (two hours before your procedure to ensure your preparation). Free valet parking service is available.   Special note: Every effort is made to have your procedure done on time. Please understand that emergencies sometimes delay scheduled procedures.  2. Diet: Do not eat  or drink anything after midnight prior to your procedure except sips of water to take medications.  3. Labs: You will need to have blood drawn on Thursday, March 14 at [facility] in our office [place] (Lunch 12:30 - 1:30)   Phone: [phone]. You do not need to be fasting.  4. Medication instructions in preparation for your procedure:   Current Outpatient Medications (Endocrine & Metabolic):  .  metFORMIN (GLUCOPHAGE) 1000 MG tablet, Take 1 tablet (1,000 mg total) by mouth 2 (two) times daily with a meal.  Current Outpatient Medications (Cardiovascular):  .  atorvastatin (LIPITOR) 40 MG tablet, Take 1 tablet (40 mg total) by mouth at bedtime. .  isosorbide mononitrate (IMDUR) 60 MG 24 hr tablet, Take 0.5 tablets (30 mg total) by mouth daily. [name]  lisinopril (PRINIVIL,ZESTRIL) 2.5 MG tablet, Take 1 tablet (2.5 mg total) by mouth every evening. .  metoprolol succinate (TOPROL-XL) 25 MG 24 hr tablet, Take 3 tablets (75 mg total) by mouth daily after breakfast. Take with or immediately following a meal. .  nitroGLYCERIN (NITROSTAT) 0.4 MG SL tablet, Place 1 tablet (0.4 mg total) under the tongue every 5 (five) minutes as needed for chest pain. Up to 3 doses for chest pain. If pain has not subsided after 3 doses go to the ER. .  ranolazine (RANEXA) 1000 MG SR tablet, Take 1 tablet (1,000 mg total) by mouth 2 (two) times daily.   Current Outpatient Medications (Analgesics):  .  aspirin EC 81 MG tablet, Take 81 mg by mouth daily with breakfast.   Current Outpatient Medications (Hematological):  .  clopidogrel (PLAVIX) 75 MG tablet, Take 1 tablet (75 mg total)  by mouth daily with breakfast.  Current Outpatient Medications (Other):  .  bifidobacterium infantis (ALIGN) capsule, Take 1 capsule by mouth daily. [name]  glucose blood ([employer]ER TEST STRIPS) test strip, 1 each by Other route daily. .  hydroxypropyl methylcellulose / hypromellose (ISOPTO TEARS / GONIOVISC)  2.5 % ophthalmic solution, Place 2 drops into the right eye as needed for dry eyes. .  Omega-3 Fatty Acids (FISH OIL PO), Take 300 mg by mouth every evening.  .  Wheat Dextrin (BENEFIBER) POWD, Take 15 mLs by mouth daily as needed (FOR FIBER). *For reference purposes while preparing patient instructions.   Delete this med list prior to printing instructions for patient.*    Stop taking, Glucophage (Metformin) on Monday, March 18. AND RESUME 48 HOURS AFTER CATH  On the morning of your procedure, take your Aspirin and any morning medicines NOT listed above(METFORMIN).  You may use sips of water.  5. Plan for one night stay--bring personal belongings. 6. Bring a current list of your medications and current insurance cards. 7. You MUST have a responsible person to drive you home. 8. Someone MUST be with you the first 24 hours after you arrive home or your discharge will be delayed. 9. Please wear clothes that are easy to get on and off and wear slip-on shoes.  Thank you for allowing [place] to care for you!   -- [place] Invasive Cardiovascular services

## 2018-01-06 ENCOUNTER — Other Ambulatory Visit: Payer: Self-pay | Admitting: Physician Assistant

## 2018-01-06 ENCOUNTER — Inpatient Hospital Stay (HOSPITAL_COMMUNITY)
Admission: EM | Admit: 2018-01-06 | Discharge: 2018-01-25 | DRG: 291 | Disposition: E | Payer: Medicare Other | Attending: Cardiology | Admitting: Cardiology

## 2018-01-06 ENCOUNTER — Emergency Department (HOSPITAL_COMMUNITY): Payer: Medicare Other

## 2018-01-06 ENCOUNTER — Encounter (HOSPITAL_COMMUNITY): Payer: Medicare Other

## 2018-01-06 ENCOUNTER — Other Ambulatory Visit: Payer: Self-pay

## 2018-01-06 ENCOUNTER — Encounter (HOSPITAL_COMMUNITY): Payer: Self-pay | Admitting: *Deleted

## 2018-01-06 DIAGNOSIS — I442 Atrioventricular block, complete: Secondary | ICD-10-CM

## 2018-01-06 DIAGNOSIS — Z950 Presence of cardiac pacemaker: Secondary | ICD-10-CM | POA: Diagnosis not present

## 2018-01-06 DIAGNOSIS — Z8042 Family history of malignant neoplasm of prostate: Secondary | ICD-10-CM

## 2018-01-06 DIAGNOSIS — Z85828 Personal history of other malignant neoplasm of skin: Secondary | ICD-10-CM

## 2018-01-06 DIAGNOSIS — Z9841 Cataract extraction status, right eye: Secondary | ICD-10-CM

## 2018-01-06 DIAGNOSIS — M1711 Unilateral primary osteoarthritis, right knee: Secondary | ICD-10-CM | POA: Diagnosis present

## 2018-01-06 DIAGNOSIS — I255 Ischemic cardiomyopathy: Secondary | ICD-10-CM | POA: Diagnosis not present

## 2018-01-06 DIAGNOSIS — Z7902 Long term (current) use of antithrombotics/antiplatelets: Secondary | ICD-10-CM

## 2018-01-06 DIAGNOSIS — R042 Hemoptysis: Secondary | ICD-10-CM | POA: Diagnosis not present

## 2018-01-06 DIAGNOSIS — Z961 Presence of intraocular lens: Secondary | ICD-10-CM | POA: Diagnosis present

## 2018-01-06 DIAGNOSIS — Z7984 Long term (current) use of oral hypoglycemic drugs: Secondary | ICD-10-CM

## 2018-01-06 DIAGNOSIS — Z823 Family history of stroke: Secondary | ICD-10-CM

## 2018-01-06 DIAGNOSIS — E861 Hypovolemia: Secondary | ICD-10-CM

## 2018-01-06 DIAGNOSIS — N179 Acute kidney failure, unspecified: Secondary | ICD-10-CM | POA: Diagnosis present

## 2018-01-06 DIAGNOSIS — E1122 Type 2 diabetes mellitus with diabetic chronic kidney disease: Secondary | ICD-10-CM | POA: Diagnosis present

## 2018-01-06 DIAGNOSIS — E875 Hyperkalemia: Secondary | ICD-10-CM | POA: Diagnosis present

## 2018-01-06 DIAGNOSIS — M170 Bilateral primary osteoarthritis of knee: Secondary | ICD-10-CM | POA: Diagnosis present

## 2018-01-06 DIAGNOSIS — N183 Chronic kidney disease, stage 3 (moderate): Secondary | ICD-10-CM | POA: Diagnosis present

## 2018-01-06 DIAGNOSIS — Z955 Presence of coronary angioplasty implant and graft: Secondary | ICD-10-CM

## 2018-01-06 DIAGNOSIS — Z66 Do not resuscitate: Secondary | ICD-10-CM | POA: Diagnosis not present

## 2018-01-06 DIAGNOSIS — J9601 Acute respiratory failure with hypoxia: Secondary | ICD-10-CM | POA: Diagnosis present

## 2018-01-06 DIAGNOSIS — K5790 Diverticulosis of intestine, part unspecified, without perforation or abscess without bleeding: Secondary | ICD-10-CM | POA: Diagnosis present

## 2018-01-06 DIAGNOSIS — R531 Weakness: Secondary | ICD-10-CM

## 2018-01-06 DIAGNOSIS — I080 Rheumatic disorders of both mitral and aortic valves: Secondary | ICD-10-CM | POA: Diagnosis present

## 2018-01-06 DIAGNOSIS — I509 Heart failure, unspecified: Secondary | ICD-10-CM

## 2018-01-06 DIAGNOSIS — F411 Generalized anxiety disorder: Secondary | ICD-10-CM

## 2018-01-06 DIAGNOSIS — I5023 Acute on chronic systolic (congestive) heart failure: Secondary | ICD-10-CM | POA: Diagnosis present

## 2018-01-06 DIAGNOSIS — E7800 Pure hypercholesterolemia, unspecified: Secondary | ICD-10-CM | POA: Diagnosis present

## 2018-01-06 DIAGNOSIS — Z96652 Presence of left artificial knee joint: Secondary | ICD-10-CM | POA: Diagnosis present

## 2018-01-06 DIAGNOSIS — I248 Other forms of acute ischemic heart disease: Secondary | ICD-10-CM | POA: Diagnosis not present

## 2018-01-06 DIAGNOSIS — I130 Hypertensive heart and chronic kidney disease with heart failure and stage 1 through stage 4 chronic kidney disease, or unspecified chronic kidney disease: Principal | ICD-10-CM | POA: Diagnosis present

## 2018-01-06 DIAGNOSIS — I252 Old myocardial infarction: Secondary | ICD-10-CM

## 2018-01-06 DIAGNOSIS — Z87891 Personal history of nicotine dependence: Secondary | ICD-10-CM

## 2018-01-06 DIAGNOSIS — Z7982 Long term (current) use of aspirin: Secondary | ICD-10-CM

## 2018-01-06 DIAGNOSIS — Z7189 Other specified counseling: Secondary | ICD-10-CM

## 2018-01-06 DIAGNOSIS — R570 Cardiogenic shock: Secondary | ICD-10-CM | POA: Diagnosis not present

## 2018-01-06 DIAGNOSIS — I4891 Unspecified atrial fibrillation: Secondary | ICD-10-CM | POA: Diagnosis present

## 2018-01-06 DIAGNOSIS — I952 Hypotension due to drugs: Secondary | ICD-10-CM

## 2018-01-06 DIAGNOSIS — E860 Dehydration: Secondary | ICD-10-CM | POA: Diagnosis present

## 2018-01-06 DIAGNOSIS — I959 Hypotension, unspecified: Secondary | ICD-10-CM | POA: Diagnosis present

## 2018-01-06 DIAGNOSIS — R0602 Shortness of breath: Secondary | ICD-10-CM | POA: Diagnosis not present

## 2018-01-06 DIAGNOSIS — M160 Bilateral primary osteoarthritis of hip: Secondary | ICD-10-CM | POA: Diagnosis present

## 2018-01-06 DIAGNOSIS — E1142 Type 2 diabetes mellitus with diabetic polyneuropathy: Secondary | ICD-10-CM | POA: Diagnosis present

## 2018-01-06 DIAGNOSIS — I25119 Atherosclerotic heart disease of native coronary artery with unspecified angina pectoris: Secondary | ICD-10-CM | POA: Diagnosis present

## 2018-01-06 DIAGNOSIS — I9589 Other hypotension: Secondary | ICD-10-CM | POA: Diagnosis present

## 2018-01-06 DIAGNOSIS — K219 Gastro-esophageal reflux disease without esophagitis: Secondary | ICD-10-CM | POA: Diagnosis present

## 2018-01-06 DIAGNOSIS — Z9842 Cataract extraction status, left eye: Secondary | ICD-10-CM

## 2018-01-06 DIAGNOSIS — Z515 Encounter for palliative care: Secondary | ICD-10-CM

## 2018-01-06 DIAGNOSIS — E785 Hyperlipidemia, unspecified: Secondary | ICD-10-CM | POA: Diagnosis present

## 2018-01-06 LAB — CBC
HEMATOCRIT: 37.9 % — AB (ref 39.0–52.0)
HEMOGLOBIN: 12.7 g/dL — AB (ref 13.0–17.0)
MCH: 28.7 pg (ref 26.0–34.0)
MCHC: 33.5 g/dL (ref 30.0–36.0)
MCV: 85.6 fL (ref 78.0–100.0)
Platelets: 256 10*3/uL (ref 150–400)
RBC: 4.43 MIL/uL (ref 4.22–5.81)
RDW: 16 % — AB (ref 11.5–15.5)
WBC: 8 10*3/uL (ref 4.0–10.5)

## 2018-01-06 LAB — URINALYSIS, ROUTINE W REFLEX MICROSCOPIC
Bilirubin Urine: NEGATIVE
Glucose, UA: NEGATIVE mg/dL
Hgb urine dipstick: NEGATIVE
KETONES UR: 5 mg/dL — AB
LEUKOCYTES UA: NEGATIVE
NITRITE: NEGATIVE
PROTEIN: NEGATIVE mg/dL
Specific Gravity, Urine: 1.023 (ref 1.005–1.030)
pH: 5 (ref 5.0–8.0)

## 2018-01-06 LAB — CBC WITH DIFFERENTIAL/PLATELET
BASOS ABS: 0 10*3/uL (ref 0.0–0.1)
BASOS PCT: 0 %
Eosinophils Absolute: 0.1 10*3/uL (ref 0.0–0.7)
Eosinophils Relative: 1 %
HEMATOCRIT: 40.4 % (ref 39.0–52.0)
HEMOGLOBIN: 13.6 g/dL (ref 13.0–17.0)
LYMPHS PCT: 20 %
Lymphs Abs: 1.7 10*3/uL (ref 0.7–4.0)
MCH: 29 pg (ref 26.0–34.0)
MCHC: 33.7 g/dL (ref 30.0–36.0)
MCV: 86.1 fL (ref 78.0–100.0)
MONO ABS: 0.5 10*3/uL (ref 0.1–1.0)
Monocytes Relative: 6 %
NEUTROS ABS: 6.1 10*3/uL (ref 1.7–7.7)
NEUTROS PCT: 73 %
Platelets: 276 10*3/uL (ref 150–400)
RBC: 4.69 MIL/uL (ref 4.22–5.81)
RDW: 15.8 % — ABNORMAL HIGH (ref 11.5–15.5)
WBC: 8.4 10*3/uL (ref 4.0–10.5)

## 2018-01-06 LAB — CREATININE, SERUM
Creatinine, Ser: 1.38 mg/dL — ABNORMAL HIGH (ref 0.61–1.24)
GFR calc Af Amer: 51 mL/min — ABNORMAL LOW (ref >60)
GFR calc non Af Amer: 44 mL/min — ABNORMAL LOW (ref >60)

## 2018-01-06 LAB — CBG MONITORING, ED: Glucose-Capillary: 137 mg/dL — ABNORMAL HIGH (ref 65–99)

## 2018-01-06 LAB — BASIC METABOLIC PANEL
ANION GAP: 12 (ref 5–15)
BUN: 29 mg/dL — ABNORMAL HIGH (ref 6–20)
CO2: 19 mmol/L — ABNORMAL LOW (ref 22–32)
Calcium: 9.1 mg/dL (ref 8.9–10.3)
Chloride: 101 mmol/L (ref 101–111)
Creatinine, Ser: 1.52 mg/dL — ABNORMAL HIGH (ref 0.61–1.24)
GFR, EST AFRICAN AMERICAN: 45 mL/min — AB (ref >60)
GFR, EST NON AFRICAN AMERICAN: 39 mL/min — AB (ref >60)
GLUCOSE: 160 mg/dL — AB (ref 65–99)
POTASSIUM: 5.9 mmol/L — AB (ref 3.5–5.1)
Sodium: 132 mmol/L — ABNORMAL LOW (ref 135–145)

## 2018-01-06 LAB — TROPONIN I
Troponin I: 0.18 ng/mL (ref <0.03)
Troponin I: 0.14 ng/mL (ref <0.03)

## 2018-01-06 LAB — BRAIN NATRIURETIC PEPTIDE: B NATRIURETIC PEPTIDE 5: 615.2 pg/mL — AB (ref 0.0–100.0)

## 2018-01-06 LAB — PROTIME-INR
INR: 1.11
PROTHROMBIN TIME: 14.2 s (ref 11.4–15.2)

## 2018-01-06 LAB — POTASSIUM: POTASSIUM: 4.8 mmol/L (ref 3.5–5.1)

## 2018-01-06 LAB — TSH: TSH: 0.814 u[IU]/mL (ref 0.350–4.500)

## 2018-01-06 MED ORDER — SODIUM CHLORIDE 0.9 % IV BOLUS (SEPSIS)
500.0000 mL | Freq: Once | INTRAVENOUS | Status: AC
  Administered 2018-01-06: 50 mL via INTRAVENOUS

## 2018-01-06 MED ORDER — PANTOPRAZOLE SODIUM 40 MG PO TBEC
40.0000 mg | DELAYED_RELEASE_TABLET | Freq: Every day | ORAL | Status: DC
  Administered 2018-01-07 – 2018-01-10 (×4): 40 mg via ORAL
  Filled 2018-01-06 (×4): qty 1

## 2018-01-06 MED ORDER — CLOPIDOGREL BISULFATE 75 MG PO TABS
75.0000 mg | ORAL_TABLET | Freq: Every day | ORAL | Status: DC
  Administered 2018-01-07 – 2018-01-10 (×4): 75 mg via ORAL
  Filled 2018-01-06 (×4): qty 1

## 2018-01-06 MED ORDER — HYPROMELLOSE (GONIOSCOPIC) 2.5 % OP SOLN: 2.0000 [drp] | OPHTHALMIC | Status: DC | PRN

## 2018-01-06 MED ORDER — SODIUM CHLORIDE 0.9 % IV SOLN
INTRAVENOUS | Status: DC
  Administered 2018-01-06: 17:00:00 via INTRAVENOUS

## 2018-01-06 MED ORDER — ASPIRIN EC 81 MG PO TBEC
81.0000 mg | DELAYED_RELEASE_TABLET | Freq: Every day | ORAL | Status: DC
  Administered 2018-01-07 – 2018-01-10 (×4): 81 mg via ORAL
  Filled 2018-01-06 (×4): qty 1

## 2018-01-06 MED ORDER — METOPROLOL SUCCINATE ER 50 MG PO TB24
75.0000 mg | ORAL_TABLET | Freq: Every day | ORAL | Status: DC
  Filled 2018-01-06: qty 1

## 2018-01-06 MED ORDER — ONDANSETRON HCL 4 MG/2ML IJ SOLN
4.0000 mg | Freq: Four times a day (QID) | INTRAMUSCULAR | Status: DC | PRN
  Administered 2018-01-07 – 2018-01-10 (×4): 4 mg via INTRAVENOUS
  Filled 2018-01-06 (×5): qty 2

## 2018-01-06 MED ORDER — FAMOTIDINE 20 MG PO TABS: 20.0000 mg | ORAL_TABLET | Freq: Every day | ORAL | Status: DC | PRN

## 2018-01-06 MED ORDER — ACETAMINOPHEN 325 MG PO TABS
650.0000 mg | ORAL_TABLET | ORAL | Status: DC | PRN
  Administered 2018-01-09: 650 mg via ORAL
  Filled 2018-01-06: qty 2

## 2018-01-06 MED ORDER — SODIUM CHLORIDE 0.9 % IV BOLUS (SEPSIS)
1000.0000 mL | Freq: Once | INTRAVENOUS | Status: AC
  Administered 2018-01-06: 1000 mL via INTRAVENOUS

## 2018-01-06 MED ORDER — ISOSORBIDE MONONITRATE ER 30 MG PO TB24: 30.0000 mg | ORAL_TABLET | Freq: Every day | ORAL | Status: DC

## 2018-01-06 MED ORDER — SODIUM CHLORIDE 0.9 % IV SOLN: 250.0000 mL | INTRAVENOUS | Status: DC | PRN

## 2018-01-06 MED ORDER — HEPARIN BOLUS VIA INFUSION
4000.0000 [IU] | Freq: Once | INTRAVENOUS | Status: AC
  Administered 2018-01-06: 4000 [IU] via INTRAVENOUS
  Filled 2018-01-06: qty 4000

## 2018-01-06 MED ORDER — SODIUM CHLORIDE 0.9% FLUSH
3.0000 mL | Freq: Two times a day (BID) | INTRAVENOUS | Status: DC
  Administered 2018-01-07 – 2018-01-10 (×7): 3 mL via INTRAVENOUS

## 2018-01-06 MED ORDER — RANOLAZINE ER 500 MG PO TB12
1000.0000 mg | ORAL_TABLET | Freq: Two times a day (BID) | ORAL | Status: DC
  Administered 2018-01-06: 1000 mg via ORAL
  Filled 2018-01-06 (×2): qty 2

## 2018-01-06 MED ORDER — ATORVASTATIN CALCIUM 40 MG PO TABS
40.0000 mg | ORAL_TABLET | Freq: Every day | ORAL | Status: DC
  Administered 2018-01-06 – 2018-01-09 (×4): 40 mg via ORAL
  Filled 2018-01-06 (×4): qty 1

## 2018-01-06 MED ORDER — ALIGN PO CAPS: 1.0000 | ORAL_CAPSULE | Freq: Every day | ORAL | Status: DC

## 2018-01-06 MED ORDER — POLYETHYLENE GLYCOL 3350 17 G PO PACK: 17.0000 g | PACK | ORAL | Status: DC | PRN

## 2018-01-06 MED ORDER — SODIUM CHLORIDE 0.9% FLUSH
3.0000 mL | INTRAVENOUS | Status: DC | PRN
  Administered 2018-01-08: 3 mL via INTRAVENOUS
  Filled 2018-01-06: qty 3

## 2018-01-06 MED ORDER — HEPARIN (PORCINE) IN NACL 100-0.45 UNIT/ML-% IJ SOLN
1000.0000 [IU]/h | INTRAMUSCULAR | Status: DC
  Administered 2018-01-06: 1100 [IU]/h via INTRAVENOUS
  Filled 2018-01-06: qty 250

## 2018-01-06 NOTE — ED Notes (Signed)
Heparin started

## 2018-01-06 NOTE — Progress Notes (Signed)
ANTICOAGULATION CONSULT NOTE - Initial Consult  Pharmacy Consult for heparin Indication: chest pain/ACS  No Known Allergies  Patient Measurements: Height: 6' (182.9 cm) Weight: 190 lb (86.2 kg) IBW/kg (Calculated) : 77.6 Heparin Dosing Weight: 86 kg  Assessment: 84 yo M presents with weakness and hypotension. Cards starting heparin for ACS. No anticoag PTA. CBC stable.   Goal of Therapy:  Heparin level 0.3-0.7 units/ml Monitor platelets by anticoagulation protocol: Yes   Plan:  Give heparin 4,000 unit bolus Start heparin gtt at 1,100 units/hr Monitor daily heparin level, CBC, s/s of bleed   Ebenezer Mccaskey J 01/01/2018,5:33 PM

## 2018-01-06 NOTE — ED Notes (Signed)
Called pharmacy for medicines  They will send after they are verified

## 2018-01-06 NOTE — ED Notes (Signed)
pts dinner tray arrived.

## 2018-01-06 NOTE — ED Notes (Signed)
Pt c/o some sob  02 sats 97%  He does not use 02 at home.  02 placed on the pt as requested at 2 liters  nasal

## 2018-01-06 NOTE — ED Notes (Signed)
Lab called critical value troponin 0.18. Notified EDP.

## 2018-01-06 NOTE — ED Provider Notes (Signed)
[facility] EMERGENCY DEPARTMENT Provider Note   CSN: [id number] Arrival date & time: 01/05/2018  1109     History   Chief Complaint Chief Complaint  Patient presents with  . Weakness    HPI Randall Callahan is a 84 y.o. male.  The history is provided by the patient. No language interpreter was used.  Weakness  Primary symptoms include dizziness. This is a new problem. The current episode started 3 to 5 hours ago. The problem has been gradually worsening. There was no focality noted. There has been no fever. There were no medications administered prior to arrival. Associated medical issues do not include a bleeding disorder or a clotting disorder.  Pt complains of weakness.  Family reports pt's blood pressure was low today.  Pt reports increased weakness.  Pt reports difficulty eating and drinking.  Pt reports he has cardiac disease.  Pt reports when he has heart problems he has discomfort in his neck and in his bicep.  Pt reports he does not have chest pain.  Pt reports he is scheduled for a cath next week.  Pt reports he had a cath 3 weeks ago.    Past Medical History:  Diagnosis Date  . Angina pectoris, unstable ([place])   . Angiomyolipoma of kidney   . Arthritis    "hips and knees" (09/29/2017)  . ASCVD (arteriosclerotic cardiovascular disease)   . Bradycardia   . Cancer [facility]) 2002   "iliac crest"  . Cancer of skin, face    "several" (09/29/2017)  . Cardiomyopathy ([place])   . Chest pain   . Chest tightness   . Chronic lower back pain   . CKD (chronic kidney disease), stage III ([place])    [name] 09/29/2017  . Complication of anesthesia    "my urinary tract becomes irritated; takes ~ 3-4 weeks to go away" (09/29/2017)  . Coronary artery disease   . Diverticulosis   . GERD (gastroesophageal reflux disease)   . High cholesterol   . Hx of sick sinus syndrome   . Hypertension   . MCI (mild cognitive impairment)   . MI (myocardial infarction) ([place]) 2003; ~  12/2016;   . NSTEMI (non-ST elevation myocardial infarction) ([place]) 08/2016   [name] 09/29/2017  . Peripheral neuropathy   . Pneumonia 2000s X ?2  . Presence of permanent cardiac pacemaker 2015  . Schatzki's ring   . Squamous cell carcinoma in situ of skin   . Type II diabetes mellitus [facility])     Patient Active Problem List   Diagnosis Date Noted  . Unstable angina ([place]) 12/25/2017  . Squamous cell carcinoma in situ of skin   . Schatzki's ring   . Pneumonia   . Peripheral neuropathy   . MCI (mild cognitive impairment)   . Hypertension   . Hx of sick sinus syndrome   . High cholesterol   . Diverticulosis   . GERD (gastroesophageal reflux disease)   . CAD S/P percutaneous coronary angioplasty   . Complication of anesthesia   . CKD (chronic kidney disease), stage III ([place])   . Chronic lower back pain   . Chest tightness   . Chest pain   . Bradycardia   . Cancer of skin, face   . ASCVD (arteriosclerotic cardiovascular disease)   . Arthritis   . Angiomyolipoma of kidney   . Angina pectoris, unstable ([place])   . Dyspnea   . CHF (congestive heart failure) ([place]) 09/29/2017  . Hyperkalemia 09/29/2017  . Hyponatremia 09/29/2017  .  Gait abnormality 01/08/2017  . Neck pain 01/08/2017  . Low back pain 01/08/2017  . Ischemic cardiomyopathy 12/17/2016  . Non-insulin treated type 2 diabetes mellitus ([place]) 12/17/2016  . Hyperlipidemia 12/17/2016  . NSTEMI (non-ST elevation myocardial infarction) ([place]) 08/27/2016  . Presence of permanent cardiac pacemaker 10/27/2013  . Cancer ([place]) 10/27/2000    Past Surgical History:  Procedure Laterality Date  . CARDIAC CATHETERIZATION  >2003 X 2   "couldn't unblock the stents that were blocked"  . CATARACT EXTRACTION    . CATARACT EXTRACTION W/ INTRAOCULAR LENS IMPLANT Right 2000s  . COLONOSCOPY W/ BIOPSIES AND POLYPECTOMY    . CORONARY ANGIOGRAPHY    . CORONARY ANGIOPLASTY WITH STENT PLACEMENT  2003   "2 stents"  . CORONARY STENT INTERVENTION  N/A 10/02/2017   Procedure: CORONARY STENT INTERVENTION;  Surgeon: Jettie Booze, MD;  Location: [facility];  Service: Cardiovascular;  Laterality: N/A;  . ESOPHAGOGASTRODUODENOSCOPY    . EXCISIONAL HEMORRHOIDECTOMY    . INSERT / REPLACE / REMOVE PACEMAKER  2015   [name] 09/29/2017  . JOINT REPLACEMENT    . LEFT HEART CATH N/A 10/02/2017   Procedure: Left Heart Cath;  Surgeon: Jettie Booze, MD;  Location: [facility];  Service: Cardiovascular;  Laterality: N/A;  . LEFT HEART CATH AND CORONARY ANGIOGRAPHY N/A 09/30/2017   Procedure: LEFT HEART CATH AND CORONARY ANGIOGRAPHY;  Surgeon: Wellington Hampshire, MD;  Location: [facility];  Service: Cardiovascular;  Laterality: N/A;  . MASS EXCISION Right 2002   "iliac crest cancer"  . SKIN CANCER EXCISION     "facial"  . TONSILLECTOMY    . TOTAL KNEE ARTHROPLASTY Left ~ 2012  . VASCULAR SURGERY         Home Medications    Prior to Admission medications   Medication Sig Start Date End Date Taking? Authorizing Provider  aspirin EC 81 MG tablet Take 81 mg by mouth daily with breakfast.     [provider]  atorvastatin (LIPITOR) 40 MG tablet Take 1 tablet (40 mg total) by mouth at bedtime. 10/03/17   Regalado, Belkys A, MD  bifidobacterium infantis (ALIGN) capsule Take 1 capsule by mouth daily.    [provider]  clopidogrel (PLAVIX) 75 MG tablet Take 1 tablet (75 mg total) by mouth daily with breakfast. 10/03/17   Regalado, Belkys A, MD  glucose blood ([employer] TEST STRIPS) test strip 1 each by Other route daily. 12/30/17   Cheryln Manly, NP  hydroxypropyl methylcellulose / hypromellose (ISOPTO TEARS / GONIOVISC) 2.5 % ophthalmic solution Place 2 drops into the right eye as needed for dry eyes.    [provider]  isosorbide mononitrate (IMDUR) 60 MG 24 hr tablet Take 0.5 tablets (30 mg total) by mouth daily. 12/18/17   [name], MD  lisinopril (PRINIVIL,ZESTRIL) 2.5 MG tablet  Take 1 tablet (2.5 mg total) by mouth every evening. 10/22/17   Erlene Quan, PA-C  metFORMIN (GLUCOPHAGE) 1000 MG tablet Take 1 tablet (1,000 mg total) by mouth 2 (two) times daily with a meal. 12/30/17   Cheryln Manly, NP  metoprolol succinate (TOPROL-XL) 25 MG 24 hr tablet Take 3 tablets (75 mg total) by mouth daily after breakfast. Take with or immediately following a meal. 11/25/17   Crenshaw, Denice Bors, MD  nitroGLYCERIN (NITROSTAT) 0.4 MG SL tablet Place 1 tablet (0.4 mg total) under the tongue every 5 (five) minutes as needed for chest pain. Up to 3 doses  for chest pain. If pain has not subsided after 3 doses go to the ER. 10/03/17   Regalado, Jerald Kief A, MD  Omega-3 Fatty Acids (FISH OIL PO) Take 300 mg by mouth every evening.     [provider]  pantoprazole (PROTONIX) 40 MG tablet Take 1 tablet (40 mg total) by mouth daily. 01/05/18   Almyra Deforest, PA  ranolazine (RANEXA) 1000 MG SR tablet Take 1 tablet (1,000 mg total) by mouth 2 (two) times daily. 12/30/17   Cheryln Manly, NP  Wheat Dextrin (BENEFIBER) POWD Take 15 mLs by mouth daily as needed (FOR FIBER).    [provider]    Family History Family History  Problem Relation Age of Onset  . Angina Mother   . Stroke Mother   . Other Father        Car accident  . Prostate cancer Brother   . Cancer Brother   . Macular degeneration Sister   . Amblyopia Neg Hx   . Blindness Neg Hx   . Cataracts Neg Hx   . Glaucoma Neg Hx   . Retinal detachment Neg Hx   . Strabismus Neg Hx   . Retinitis pigmentosa Neg Hx     Social History Social History   Tobacco Use  . Smoking status: Former Smoker    Years: 2.00    Types: Cigarettes  . Smokeless tobacco: Never Used  . Tobacco comment: STARTED SMOKING IN 4TH GRADE, QUIT IN 6TH  Substance Use Topics  . Alcohol use: Yes    Alcohol/week: 0.6 oz    Types: 1 Cans of beer per week  . Drug use: No     Allergies   Patient has no known allergies.   Review of  Systems Review of Systems  Neurological: Positive for dizziness and weakness.  All other systems reviewed and are negative.    Physical Exam Updated Vital Signs BP 115/70   Pulse 67   Temp 97.8 F (36.6 C) (Oral)   Resp 18   Ht 6' (1.829 m)   Wt 86.2 kg (190 lb)   SpO2 100%   BMI 25.77 kg/m   Physical Exam  Constitutional: He appears well-developed and well-nourished.  HENT:  Head: Normocephalic and atraumatic.  Eyes: Conjunctivae are normal.  Neck: Neck supple.  Cardiovascular: Normal rate and regular rhythm.  No murmur heard. Pulmonary/Chest: Effort normal and breath sounds normal. No respiratory distress.  Abdominal: Soft. There is no tenderness.  Musculoskeletal: Normal range of motion. He exhibits no edema.  Neurological: He is alert.  Skin: Skin is warm and dry.  Psychiatric: He has a normal mood and affect.  Nursing note and vitals reviewed.    ED Treatments / Results  Labs (all labs ordered are listed, but only abnormal results are displayed) Labs Reviewed  BASIC METABOLIC PANEL - Abnormal; Notable for the following components:      Result Value   Sodium 132 (*)    Potassium 5.9 (*)    CO2 19 (*)    Glucose, Bld 160 (*)    BUN 29 (*)    Creatinine, Ser 1.52 (*)    GFR calc non Af Amer 39 (*)    GFR calc Af Amer 45 (*)    All other components within normal limits  CBC WITH DIFFERENTIAL/PLATELET - Abnormal; Notable for the following components:   RDW 15.8 (*)    All other components within normal limits  TROPONIN I - Abnormal; Notable for the following components:  Troponin I 0.18 (*)    All other components within normal limits  CBG MONITORING, ED - Abnormal; Notable for the following components:   Glucose-Capillary 137 (*)    All other components within normal limits  PROTIME-INR  URINALYSIS, ROUTINE W REFLEX MICROSCOPIC    EKG  EKG Interpretation None       Radiology Dg Chest 2 View  Result Date: 12/31/2017 CLINICAL DATA:   Hypotension.  Diabetes. EXAM: CHEST - 2 VIEW COMPARISON:  12/25/2017 FINDINGS: Pacemaker leads appear unchanged. Heart size upper limits of normal. Aortic atherosclerosis as seen previously. Increased interstitial prominence most consistent with mild interstitial edema. Small amount of fluid in the fissures and pleural space. Mild volume loss in the lower lobes. IMPRESSION: Slight worsening of interstitial markings most consistent with mild interstitial edema. Electronically Signed   By: Nelson Chimes M.D.   On: 12/30/2017 13:09    Procedures Procedures (including critical care time)  Medications Ordered in ED Medications  sodium chloride 0.9 % bolus 1,000 mL (1,000 mLs Intravenous New Bag/Given 01/15/2018 1320)    And  0.9 %  sodium chloride infusion (not administered)     Initial Impression / Assessment and Plan / ED Course  I have reviewed the triage vital signs and the nursing notes.  Pertinent labs & imaging results that were available during my care of the patient were reviewed by me and considered in my medical decision making (see chart for details).     MDM  Pt has a omlex cardiac history.  Labs today show elevated Bun and creatine. Troponin is 0.18.  Pt has had consistently positive troponins for 3 months. Pt has increase BUn and creatine.  Pt's chest xray shows increased interstitial edema  BUN and creatine are elevated.    I spoke to [facility] with Cardiology.  Cardiology to see and evaluate.  Final Clinical Impressions(s) / ED Diagnoses   Final diagnoses:  Weakness  Dehydration  Hypotension due to hypovolemia    ED Discharge Orders    None    Admission to Cardiolgya   Fransico Meadow, PA-C 01/07/2018 Vidor, Julie, MD 01/22/2018 [phone]

## 2018-01-06 NOTE — ED Notes (Signed)
Spoke with [employer] rep who will call back after receiving the transmission.

## 2018-01-06 NOTE — ED Notes (Signed)
Patient returned from xray. Interrogated [employer] pacemaker and paged rep for confirmation.

## 2018-01-06 NOTE — ED Notes (Signed)
pts daughter went home will return tomorrow at 1200n.  Pt placed in  Regular hospital bed for comfort

## 2018-01-06 NOTE — ED Notes (Signed)
I attempted to call the results of the 2nd potassium  But the phone number dialed was apparently a pager and no one called me back for the results

## 2018-01-06 NOTE — ED Notes (Signed)
No complaints  Iv site redressed  Leaking around the connections

## 2018-01-06 NOTE — ED Triage Notes (Signed)
Presents to ed from home c/o weakness and hypotension. States his b/p at home was 74/48. States he has a cardiac stent  Last month. Is scheduled for blood work tomorrow and repeat cath next week

## 2018-01-06 NOTE — ED Notes (Signed)
Pt CBG was 137, notified Greg(RN)

## 2018-01-06 NOTE — H&P (Addendum)
Cardiology Admission History and Physical:   Patient ID: Kalief Kattner; MRN: [id number]; DOB: 1928-01-25   Admission date: 01/03/2018  Primary Care Provider: Dineen Kid, MD Primary Cardiologist: Kirk Ruths, MD  Chief Complaint: Weakness  Patient Profile:   Labaron Digirolamo is a 84 y.o. male with a long, complicated history of CAD (proximal to mid LAD with total occlusion at distal segment of previous stent, proximal circumflex 99% occluded at previously placed stent, RCA with mild disease, ostial to mid ramus 90% stenosed with DESx1 to mid-distal ramus and DESx1 to proximal ramus on 10/02/2018), ICM with a EF of 30%, SSS s/p PPM, CKD stage II and DM 2.   History of Present Illness:   Mr. Kamau has a history of occluded LAD per cath in 2008.  He had a St. Jude pacemaker placed in 2015.  He had a NSTEMI in November 2017 with recommendations to treat medically. Cardiac catheterization at that time showed totally occluded LAD, 99% stenosis of his left circumflex and a moderate stenosis in the ramus branch. Repeat cardiac catheterization in December 2018 showed severe two-vessel CAD with chronically occluded proximal to mid LAD at the distal segment of previously placed stent with faint left to left and right to left collaterals and occluded proximal left circumflex at the previously placed stent with right to left collaterals.  Severe disease in large ramus.  Mild disease in the RCA and an LVEF of 30%. TCTS was consulted and felt he is too high risk for CABG surgery therefore, he underwent DES to ramus intermedius on 10/02/2017 with the possibility of consideration for CTO of LAD if recurrent symptoms refractory to medical therapy.  He was recently admitted to the hospital on 12/25/2017 for unstable angina where Ranexa was added to his medical regimen with temporary improvement of anginal symptoms. Patient reports that he has had ongoing exertional shortness of breath and fatigue since this  time. He had one episode of chest pain last Thursday for which he took one SL NTG with moderate relief.  He was last seen in the office yesterday, 01/05/2018 with recurrent chest pain. Since his last cardiac catheterization in December 2018, his symptoms improved temporarily for approximately 3-4 weeks.  However, have since progressively worsened.  In the office yesterday, he reports having chest pain every other day.  This occurs mainly with exertion and very rarely with a meal.  He was started on Protonix 40 mg daily. The case was discussed between PA and Dr. Stanford Breed and given the recurrence of chest pain, it was recommended that he undergo an additional cardiac catheterization to reevaluate previous stent placement in the ramus artery and potentially consider CTO to the LAD procedure given persistent symptoms.   He is scheduled for this cardiac catheterization with CTO on 01/12/2018 with Dr. Irish Lack however, presented to [facility] emergency department today on 01/24/2018 with complaints of overwhelming weakness and hypotension. He stated that the episode began approximately 10am this morning, in which he states he felt "very uneasy,"short of breath and weak, which has progressively become worse over the last several days.  He states that he has felt this way before in the past when his blood sugar was too low, however his was 160 after checking at home. He also checked his blood pressure which was low at 74/56.  Upon chart review it appears that his blood pressures are often soft, but never this low. Today he denies chest pain, back pain, numbness or tingling in the arms or hands. He  denies loss of consciousness, syncope or fall.    Given St. Jude PPM placement for SSS, a representative was contacted who reports that the patient has 10 years left on battery and is paced and rate controlled.  He will send fax report per ED RN.  Last device check on 11/16/2017 revealed 2 hours and 53 minutes of A. fib on 03/2017,  battery and lead parameters stable.  EKG performed with V paced rhythm and left BBB, similar to previous tracing during his office visit yesterday.  Troponin level is 0.18 however, it appears that he is chronically elevated cardiac enzymes dating back to 02/17/2017.  In December, his troponins peaked at 4.95.  During his last hospital admission on 12/25/2017 troponin levels were 0.17, 0.14, 0.15.  His potassium level on admission today is 5.9 and sodium levels are 132.  His creatinine is elevated at 1.52, up from his baseline of 1.00.  X-ray was completed which shows pacemaker leads unchanged, slight worsening of interstitial markings most consistent with mild interstitial edema.  He was given 1000 mL of fluid in the ED secondary to possible dehydration.  Last blood pressure reading 99/62, HR 80 V-paced, he is afebrile, O2 saturation 95-99% on room air.  Plan is for cardiology to admit and have heart failure team consult for medication management.   Past Medical History:  Diagnosis Date  . Angina pectoris, unstable ([place])   . Angiomyolipoma of kidney   . Arthritis    "hips and knees" (09/29/2017)  . ASCVD (arteriosclerotic cardiovascular disease)   . Bradycardia   . Cancer [facility]) 2002   "iliac crest"  . Cancer of skin, face    "several" (09/29/2017)  . Cardiomyopathy ([place])   . Chest pain   . Chest tightness   . Chronic lower back pain   . CKD (chronic kidney disease), stage III ([place])    [name] 09/29/2017  . Complication of anesthesia    "my urinary tract becomes irritated; takes ~ 3-4 weeks to go away" (09/29/2017)  . Coronary artery disease   . Diverticulosis   . GERD (gastroesophageal reflux disease)   . High cholesterol   . Hx of sick sinus syndrome   . Hypertension   . MCI (mild cognitive impairment)   . MI (myocardial infarction) ([place]) 2003; ~ 12/2016;   . NSTEMI (non-ST elevation myocardial infarction) ([place]) 08/2016   [name] 09/29/2017  . Peripheral neuropathy   . Pneumonia 2000s X ?2    . Presence of permanent cardiac pacemaker 2015  . Schatzki's ring   . Squamous cell carcinoma in situ of skin   . Type II diabetes mellitus ([place])     Past Surgical History:  Procedure Laterality Date  . CARDIAC CATHETERIZATION  >2003 X 2   "couldn't unblock the stents that were blocked"  . CATARACT EXTRACTION    . CATARACT EXTRACTION W/ INTRAOCULAR LENS IMPLANT Right 2000s  . COLONOSCOPY W/ BIOPSIES AND POLYPECTOMY    . CORONARY ANGIOGRAPHY    . CORONARY ANGIOPLASTY WITH STENT PLACEMENT  2003   "2 stents"  . CORONARY STENT INTERVENTION N/A 10/02/2017   Procedure: CORONARY STENT INTERVENTION;  Surgeon: Jettie Booze, MD;  Location: [place] [facility];  Service: Cardiovascular;  Laterality: N/A;  . ESOPHAGOGASTRODUODENOSCOPY    . EXCISIONAL HEMORRHOIDECTOMY    . INSERT / REPLACE / REMOVE PACEMAKER  2015   [name] 09/29/2017  . JOINT REPLACEMENT    . LEFT HEART CATH N/A 10/02/2017   Procedure: Left Heart Cath;  Surgeon: Jettie Booze, MD;  Location: [place] [facility];  Service: Cardiovascular;  Laterality: N/A;  . LEFT HEART CATH AND CORONARY ANGIOGRAPHY N/A 09/30/2017   Procedure: LEFT HEART CATH AND CORONARY ANGIOGRAPHY;  Surgeon: Wellington Hampshire, MD;  Location: [place] [facility];  Service: Cardiovascular;  Laterality: N/A;  . MASS EXCISION Right 2002   "iliac crest cancer"  . SKIN CANCER EXCISION     "facial"  . TONSILLECTOMY    . TOTAL KNEE ARTHROPLASTY Left ~ 2012  . VASCULAR SURGERY       Medications Prior to Admission: Prior to Admission medications   Medication Sig Start Date End Date Taking? Authorizing Provider  aspirin EC 81 MG tablet Take 81 mg by mouth daily with breakfast.     [provider]  atorvastatin (LIPITOR) 40 MG tablet Take 1 tablet (40 mg total) by mouth at bedtime. 10/03/17   Regalado, Belkys A, MD  bifidobacterium infantis (ALIGN) capsule Take 1 capsule by mouth daily.    [provider]  clopidogrel (PLAVIX) 75 MG  tablet Take 1 tablet (75 mg total) by mouth daily with breakfast. 10/03/17   Regalado, Belkys A, MD  glucose blood ([employer] TEST STRIPS) test strip 1 each by Other route daily. 12/30/17   Cheryln Manly, NP  hydroxypropyl methylcellulose / hypromellose (ISOPTO TEARS / GONIOVISC) 2.5 % ophthalmic solution Place 2 drops into the right eye as needed for dry eyes.    [provider]  isosorbide mononitrate (IMDUR) 60 MG 24 hr tablet Take 0.5 tablets (30 mg total) by mouth daily. 12/18/17   [name], MD  lisinopril (PRINIVIL,ZESTRIL) 2.5 MG tablet Take 1 tablet (2.5 mg total) by mouth every evening. 10/22/17   Erlene Quan, PA-C  metFORMIN (GLUCOPHAGE) 1000 MG tablet Take 1 tablet (1,000 mg total) by mouth 2 (two) times daily with a meal. 12/30/17   Cheryln Manly, NP  metoprolol succinate (TOPROL-XL) 25 MG 24 hr tablet Take 3 tablets (75 mg total) by mouth daily after breakfast. Take with or immediately following a meal. 11/25/17   Crenshaw, Denice Bors, MD  nitroGLYCERIN (NITROSTAT) 0.4 MG SL tablet Place 1 tablet (0.4 mg total) under the tongue every 5 (five) minutes as needed for chest pain. Up to 3 doses for chest pain. If pain has not subsided after 3 doses go to the ER. 10/03/17   Regalado, Jerald Kief A, MD  Omega-3 Fatty Acids (FISH OIL PO) Take 300 mg by mouth every evening.     [provider]  pantoprazole (PROTONIX) 40 MG tablet Take 1 tablet (40 mg total) by mouth daily. 01/05/18   Almyra Deforest, PA  ranolazine (RANEXA) 1000 MG SR tablet Take 1 tablet (1,000 mg total) by mouth 2 (two) times daily. 12/30/17   Cheryln Manly, NP  Wheat Dextrin (BENEFIBER) POWD Take 15 mLs by mouth daily as needed (FOR FIBER).    [provider]     Allergies:   No Known Allergies  Social History:   Social History   Socioeconomic History  . Marital status: Divorced    Spouse name: Not on file  . Number of children: 6  . Years of education: some college  . Highest education  level: Not on file  Social Needs  . Financial resource strain: Not on file  . Food insecurity - worry: Not on file  . Food insecurity - inability: Not on file  . Transportation needs - medical: Not on file  .  Transportation needs - non-medical: Not on file  Occupational History  . Occupation: Retired  Tobacco Use  . Smoking status: Former Smoker    Years: 2.00    Types: Cigarettes  . Smokeless tobacco: Never Used  . Tobacco comment: STARTED SMOKING IN 4TH GRADE, QUIT IN 6TH  Substance and Sexual Activity  . Alcohol use: Yes    Alcohol/week: 0.6 oz    Types: 1 Cans of beer per week  . Drug use: No  . Sexual activity: No  Other Topics Concern  . Not on file  Social History Narrative   Lives at home with son and daughter-in-law.   Right-handed.   2 cups caffeine per day.    Family History:  The patient's family history includes Angina in his mother; Cancer in his brother; Macular degeneration in his sister; Other in his father; Prostate cancer in his brother; Stroke in his mother. There is no history of Amblyopia, Blindness, Cataracts, Glaucoma, Retinal detachment, Strabismus, or Retinitis pigmentosa.    ROS:  Please see the history of present illness.  All other ROS reviewed and negative.     Physical Exam/Data:   Vitals:   01/16/2018 1415 12/25/2017 1430 01/01/2018 1445 12/27/2017 1515  BP: 103/64 102/66 99/61 99/62   Pulse: 82 84 79 80  Resp: 20 18 (!) 27 (!) 25  Temp:      TempSrc:      SpO2: 96% 96% 95% 96%  Weight:      Height:        Intake/Output Summary (Last 24 hours) at 01/22/2018 1617 Last data filed at 12/27/2017 1444 Gross per 24 hour  Intake 1000 ml  Output 50 ml  Net 950 ml   Filed Weights   01/15/2018 1120  Weight: 190 lb (86.2 kg)   Body mass index is 25.77 kg/m.   General: Well developed, well nourished, NAD Skin: Warm, dry, intact  Head: Normocephalic, atraumatic, clear, moist mucus membranes. Neck: Negative for carotid bruits. No JVD Lungs:Clear  to ausculation bilaterally. No wheezes, rales, or rhonchi. Breathing is unlabored. Cardiovascular: RRR with S1 S2. No murmurs, rubs, or gallops Abdomen: Soft, non-tender, non-distended with normoactive bowel sounds. No obvious abdominal masses. MSK: Strength and tone appear normal for age. 5/5 in all extremities Extremities: No edema. No clubbing or cyanosis. DP/PT pulses 2+ bilaterally Neuro: Alert and oriented. No focal deficits. No facial asymmetry. MAE spontaneously. Psych: Responds to questions appropriately with normal affect.    EKG:  The ECG that was done 01/15/2018 was personally reviewed and demonstrates V-paced rhythm with a heart rate of 81 similar to previous EKG tracings  Relevant CV Studies: Cath 10/02/2017 Conclusion     Prox LAD lesion is 100% stenosed.  Ost Cx to Prox Cx lesion is 100% stenosed.  Prox RCA lesion is 30% stenosed. Noted on prior cath. Right to left collaterals.  Ost Ramus to mid Ramus lesion is 90% stenosed.  A drug-eluting stent was successfully placed using a STENT SIERRA 3.00 X 38 MM, treating the mid to distal disease, postdilated to 3.4-3.5 mm using the 3.25 stent balloon and the 3.5 balloon more proximally.  A drug-eluting stent was successfully placed using a STENT SIERRA 3.25 X 12 MM, treating the proximal ramus, postdilated to 3.5 mm.  Post intervention, there is a 0% residual stenosis.  LV end diastolic pressure is normal.  There is no aortic valve stenosis.  Continue dual antiplatelet therapy for at least one year.   Could consider CTO PCI of the  LAD in the future if this area is viable and he had refractory symptoms.    Echo 10/01/17: Study Conclusions  - Left ventricle: The cavity size was normal. There was moderate   concentric hypertrophy. Systolic function was moderately to   severely reduced. The estimated ejection fraction was in the   range of 30% to 35%. Dyskinesis of the apical myocardium. Severe   hypokinesis of  the mid-apicalanteroseptal, anterior, and   inferoseptal myocardium; consistent with infarction in the   distribution of the left anterior descending coronary artery.   Features are consistent with a pseudonormal left ventricular   filling pattern, with concomitant abnormal relaxation and   increased filling pressure (grade 2 diastolic dysfunction).   Acoustic contrast opacification revealed no evidence ofthrombus. - Aortic valve: There was mild to moderate regurgitation directed   centrally in the LVOT. - Mitral valve: Calcified annulus. Mildly thickened leaflets .   There was malcoaptation of the valve leaflets. There was moderate   to severe regurgitation directed eccentrically and posteriorly. - Left atrium: The atrium was mildly dilated. - Pulmonary arteries: Systolic pressure was moderately increased.   PA peak pressure: 48 mm Hg (S).  Laboratory Data:  Chemistry Recent Labs  Lab 01/15/2018 1145  NA 132*  K 5.9*  CL 101  CO2 19*  GLUCOSE 160*  BUN 29*  CREATININE 1.52*  CALCIUM 9.1  GFRNONAA 39*  GFRAA 45*  ANIONGAP 12    No results for input(s): PROT, ALBUMIN, AST, ALT, ALKPHOS, BILITOT in the last 168 hours. Hematology Recent Labs  Lab 01/01/2018 1145  WBC 8.4  RBC 4.69  HGB 13.6  HCT 40.4  MCV 86.1  MCH 29.0  MCHC 33.7  RDW 15.8*  PLT 276   Cardiac Enzymes Recent Labs  Lab 01/03/2018 1145  TROPONINI 0.18*   No results for input(s): TROPIPOC in the last 168 hours.  BNPNo results for input(s): BNP, PROBNP in the last 168 hours.  DDimer No results for input(s): DDIMER in the last 168 hours.  Radiology/Studies:  Dg Chest 2 View  Result Date: 01/19/2018 CLINICAL DATA:  Hypotension.  Diabetes. EXAM: CHEST - 2 VIEW COMPARISON:  12/25/2017 FINDINGS: Pacemaker leads appear unchanged. Heart size upper limits of normal. Aortic atherosclerosis as seen previously. Increased interstitial prominence most consistent with mild interstitial edema. Small amount of fluid  in the fissures and pleural space. Mild volume loss in the lower lobes. IMPRESSION: Slight worsening of interstitial markings most consistent with mild interstitial edema. Electronically Signed   By: Nelson Chimes M.D.   On: 01/02/2018 13:09    Assessment and Plan:   1.  Hypotension: -Stable, 99/62, 99/61,102/66 -Per patient report BP at home was 74/56, most likely why he has been symptomatic -Baseline BP appears soft with systolic blood pressures in the 90s -1 L NS given in ED, will need to be cautious given low LVEF -Most likely his hypotension secondary to low output and possible dehydration -See below regarding med changes  2.  Known severe CAD status post PCI/elevated troponin: -Proximal to mid LAD total occlusion at distal segment of previous stent, proximal circumflex 99% occluded at previously placed stent, RCA with mild disease, ostial to mid ramus 90% stenosed, DESx1 to mid-distal ramus and DESx1 to proximal ramus per cath report from 10/02/2018 -Patient for planned cardiac cath on 01/12/2018 with CTO of LAD per Dr. Irish Lack -ASA 81, Plavix 75, statin -We will continue Ranexa 1000 mg BID -Will need to consider adjusting dose if renal function  remains abnormal  -Hep gtt per pharmacy  -Cycle trop  3.  Ischemic cardiomyopathy: -Per echocardiogram 10/01/2017 LVEF 30-35% -Hold ACE secondary to hyperkalemia -Toprol XL 25 mg, IMDUR 60 mg with hold parameters given hypotension  -We will consult heart failure team to see in the AM for medication and symptom management.  Plans are to tune him up for upcoming procedure on 01/12/2018. Trish notified to help coordinate. -Weight, 190lb on admission this appears to be his baseline weight per chart review -I&O, net +950 mL, hold off on diuretic given #4  4.  Acute Kidney injury/Hyperkalemia: -Hold ACEI -K+, 5.9 we will repeat the event now, if remains elevated will give Kayexalate 15 g x1,  repeat in the AM -Monitor tele, EKG similar to prior  tracings  5.  CKD stage II: -Cr, 1.52 today.  Baseline appears to be in the 1.0- 1.1 range -Patient given 1 L NS secondary to hypotension and possible dehydration -Avoid nephrotoxic medications -Will stop ACEI  6.  DM 2: -On home metformin -Glucose on admission, 160 -Hold metformin , start SSI  Observation status chosen given unclear duration of admission; if he stays tomorrow night, will need to change to inpatient.   Severity of Illness: The appropriate patient status for this patient is OBSERVATION. Observation status is judged to be reasonable and necessary in order to provide the required intensity of service to ensure the patient's safety. The patient's presenting symptoms, physical exam findings, and initial radiographic and laboratory data in the context of their medical condition is felt to place them at decreased risk for further clinical deterioration. Furthermore, it is anticipated that the patient will be medically stable for discharge from the hospital within 2 midnights of admission. The following factors support the patient status of observation.   " The patient's presenting symptoms include weakness, fatigue, hypotension. " The physical exam findings include low BP. " The initial radiographic and laboratory data are acute kidney injury, mild interstitial edema on CXR.    For questions or updates, please contact [place] Please consult [email or web address] for contact info under Cardiology/STEMI.    Signed, Kathyrn Drown, NP  01/23/2018 4:17 PM   Agree with note by Kathyrn Drown NP  84 year old Caucasian male patient of Dr. Jacalyn Lefevre with a history of ischemic cardiomyopathy, EF of 30%. He is had a pacemaker placement past and has a history of diabetes and end-stage to see daily. Here complex ramus branch PCI and drug-eluting stent procedure performed by Dr. Varanasi12/7/18. He does have a chronically occluded LAD and circumflex with minimal disease in his right  coronary artery.he is being seen in the emergency room today for symptoms compatible with low output with relative hypotension with systolic blood pressures in the 70-80 range.He is on a beta blocker well as ACE inhibitor with moderately elevated serum potassium.he denies chest pain or increasing shortness of breath. The plan was to perform LAD CTO intervention by Dr. Irish Lack and [place] on 01/12/18. This exam is benign. His EKG is paced. And then repeat his basic metabolic panel to recheck his potassium and if still elevated will administer Kayexalate.We will ask against heart heart failure team to optimize his medical regimen. Hopefully we can keep his scheduled intervention for next week.Lorretta Harp, M.D., [place], [facility], [facility] [address] [place], [place]  [id number]  [phone] 01/03/2018 5:26 PM

## 2018-01-06 NOTE — ED Notes (Signed)
St Jude rep called back stated will send a fax report. Has 10 years left on battery and is paced rate controlled.

## 2018-01-07 ENCOUNTER — Observation Stay (HOSPITAL_COMMUNITY): Payer: Medicare Other

## 2018-01-07 ENCOUNTER — Observation Stay: Payer: Self-pay

## 2018-01-07 ENCOUNTER — Other Ambulatory Visit: Payer: Self-pay

## 2018-01-07 DIAGNOSIS — I509 Heart failure, unspecified: Secondary | ICD-10-CM | POA: Diagnosis not present

## 2018-01-07 DIAGNOSIS — I2510 Atherosclerotic heart disease of native coronary artery without angina pectoris: Secondary | ICD-10-CM | POA: Diagnosis not present

## 2018-01-07 DIAGNOSIS — J9601 Acute respiratory failure with hypoxia: Secondary | ICD-10-CM | POA: Diagnosis present

## 2018-01-07 DIAGNOSIS — E861 Hypovolemia: Secondary | ICD-10-CM | POA: Diagnosis present

## 2018-01-07 DIAGNOSIS — R570 Cardiogenic shock: Secondary | ICD-10-CM | POA: Diagnosis not present

## 2018-01-07 DIAGNOSIS — I5023 Acute on chronic systolic (congestive) heart failure: Secondary | ICD-10-CM | POA: Diagnosis present

## 2018-01-07 DIAGNOSIS — I340 Nonrheumatic mitral (valve) insufficiency: Secondary | ICD-10-CM | POA: Diagnosis not present

## 2018-01-07 DIAGNOSIS — R042 Hemoptysis: Secondary | ICD-10-CM | POA: Diagnosis not present

## 2018-01-07 DIAGNOSIS — N179 Acute kidney failure, unspecified: Secondary | ICD-10-CM | POA: Diagnosis present

## 2018-01-07 DIAGNOSIS — K5790 Diverticulosis of intestine, part unspecified, without perforation or abscess without bleeding: Secondary | ICD-10-CM | POA: Diagnosis present

## 2018-01-07 DIAGNOSIS — M170 Bilateral primary osteoarthritis of knee: Secondary | ICD-10-CM | POA: Diagnosis present

## 2018-01-07 DIAGNOSIS — I2583 Coronary atherosclerosis due to lipid rich plaque: Secondary | ICD-10-CM | POA: Diagnosis not present

## 2018-01-07 DIAGNOSIS — Z9841 Cataract extraction status, right eye: Secondary | ICD-10-CM | POA: Diagnosis not present

## 2018-01-07 DIAGNOSIS — I9589 Other hypotension: Secondary | ICD-10-CM | POA: Diagnosis present

## 2018-01-07 DIAGNOSIS — I252 Old myocardial infarction: Secondary | ICD-10-CM | POA: Diagnosis not present

## 2018-01-07 DIAGNOSIS — I248 Other forms of acute ischemic heart disease: Secondary | ICD-10-CM | POA: Diagnosis not present

## 2018-01-07 DIAGNOSIS — N183 Chronic kidney disease, stage 3 (moderate): Secondary | ICD-10-CM | POA: Diagnosis present

## 2018-01-07 DIAGNOSIS — I959 Hypotension, unspecified: Secondary | ICD-10-CM | POA: Diagnosis not present

## 2018-01-07 DIAGNOSIS — E7800 Pure hypercholesterolemia, unspecified: Secondary | ICD-10-CM | POA: Diagnosis present

## 2018-01-07 DIAGNOSIS — M160 Bilateral primary osteoarthritis of hip: Secondary | ICD-10-CM | POA: Diagnosis present

## 2018-01-07 DIAGNOSIS — E1142 Type 2 diabetes mellitus with diabetic polyneuropathy: Secondary | ICD-10-CM | POA: Diagnosis present

## 2018-01-07 DIAGNOSIS — R0602 Shortness of breath: Secondary | ICD-10-CM | POA: Diagnosis present

## 2018-01-07 DIAGNOSIS — Z9842 Cataract extraction status, left eye: Secondary | ICD-10-CM | POA: Diagnosis not present

## 2018-01-07 DIAGNOSIS — Z515 Encounter for palliative care: Secondary | ICD-10-CM | POA: Diagnosis not present

## 2018-01-07 DIAGNOSIS — E785 Hyperlipidemia, unspecified: Secondary | ICD-10-CM | POA: Diagnosis present

## 2018-01-07 DIAGNOSIS — E860 Dehydration: Secondary | ICD-10-CM | POA: Diagnosis present

## 2018-01-07 DIAGNOSIS — Z7189 Other specified counseling: Secondary | ICD-10-CM | POA: Diagnosis not present

## 2018-01-07 DIAGNOSIS — I442 Atrioventricular block, complete: Secondary | ICD-10-CM | POA: Diagnosis not present

## 2018-01-07 DIAGNOSIS — K219 Gastro-esophageal reflux disease without esophagitis: Secondary | ICD-10-CM | POA: Diagnosis present

## 2018-01-07 DIAGNOSIS — Z85828 Personal history of other malignant neoplasm of skin: Secondary | ICD-10-CM | POA: Diagnosis not present

## 2018-01-07 DIAGNOSIS — E1122 Type 2 diabetes mellitus with diabetic chronic kidney disease: Secondary | ICD-10-CM | POA: Diagnosis present

## 2018-01-07 DIAGNOSIS — I255 Ischemic cardiomyopathy: Secondary | ICD-10-CM | POA: Diagnosis present

## 2018-01-07 DIAGNOSIS — I25119 Atherosclerotic heart disease of native coronary artery with unspecified angina pectoris: Secondary | ICD-10-CM | POA: Diagnosis present

## 2018-01-07 DIAGNOSIS — F411 Generalized anxiety disorder: Secondary | ICD-10-CM | POA: Diagnosis not present

## 2018-01-07 DIAGNOSIS — I130 Hypertensive heart and chronic kidney disease with heart failure and stage 1 through stage 4 chronic kidney disease, or unspecified chronic kidney disease: Secondary | ICD-10-CM | POA: Diagnosis present

## 2018-01-07 LAB — HEMOGLOBIN A1C
Hgb A1c MFr Bld: 6.3 % — ABNORMAL HIGH (ref 4.8–5.6)
Mean Plasma Glucose: 134.11 mg/dL

## 2018-01-07 LAB — TROPONIN I
Troponin I: 0.17 ng/mL (ref <0.03)
Troponin I: 0.11 ng/mL (ref <0.03)

## 2018-01-07 LAB — GLUCOSE, CAPILLARY
GLUCOSE-CAPILLARY: 212 mg/dL — AB (ref 65–99)
GLUCOSE-CAPILLARY: 258 mg/dL — AB (ref 65–99)
Glucose-Capillary: 245 mg/dL — ABNORMAL HIGH (ref 65–99)
Glucose-Capillary: 233 mg/dL — ABNORMAL HIGH (ref 65–99)
Glucose-Capillary: 245 mg/dL — ABNORMAL HIGH (ref 65–99)
Glucose-Capillary: 188 mg/dL — ABNORMAL HIGH (ref 65–99)

## 2018-01-07 LAB — CBC
HEMATOCRIT: 43.8 % (ref 39.0–52.0)
HEMOGLOBIN: 14.5 g/dL (ref 13.0–17.0)
MCH: 29.2 pg (ref 26.0–34.0)
MCHC: 33.1 g/dL (ref 30.0–36.0)
MCV: 88.1 fL (ref 78.0–100.0)
Platelets: 276 10*3/uL (ref 150–400)
RBC: 4.97 MIL/uL (ref 4.22–5.81)
RDW: 16.2 % — ABNORMAL HIGH (ref 11.5–15.5)
WBC: 13.2 10*3/uL — AB (ref 4.0–10.5)

## 2018-01-07 LAB — COOXEMETRY PANEL
CARBOXYHEMOGLOBIN: 0.9 % (ref 0.5–1.5)
Methemoglobin: 1.4 % (ref 0.0–1.5)
O2 SAT: 49.4 %
Total hemoglobin: 13.4 g/dL (ref 12.0–16.0)

## 2018-01-07 LAB — PROCALCITONIN

## 2018-01-07 LAB — BASIC METABOLIC PANEL
Anion gap: 15 (ref 5–15)
BUN: 32 mg/dL — AB (ref 6–20)
CHLORIDE: 105 mmol/L (ref 101–111)
CO2: 13 mmol/L — ABNORMAL LOW (ref 22–32)
CREATININE: 1.77 mg/dL — AB (ref 0.61–1.24)
Calcium: 9.3 mg/dL (ref 8.9–10.3)
GFR calc Af Amer: 38 mL/min — ABNORMAL LOW (ref >60)
GFR calc non Af Amer: 33 mL/min — ABNORMAL LOW (ref >60)
GLUCOSE: 256 mg/dL — AB (ref 65–99)
POTASSIUM: 5.1 mmol/L (ref 3.5–5.1)
Sodium: 133 mmol/L — ABNORMAL LOW (ref 135–145)

## 2018-01-07 LAB — PROTIME-INR
INR: 1.29
Prothrombin Time: 16 seconds — ABNORMAL HIGH (ref 11.4–15.2)

## 2018-01-07 LAB — MRSA PCR SCREENING: MRSA by PCR: NEGATIVE

## 2018-01-07 LAB — HEPARIN LEVEL (UNFRACTIONATED): Heparin Unfractionated: 0.88 IU/mL — ABNORMAL HIGH (ref 0.30–0.70)

## 2018-01-07 MED ORDER — FUROSEMIDE 10 MG/ML IJ SOLN
20.0000 mg | Freq: Once | INTRAMUSCULAR | Status: AC
  Administered 2018-01-07: 20 mg via INTRAVENOUS
  Filled 2018-01-07: qty 2

## 2018-01-07 MED ORDER — IPRATROPIUM-ALBUTEROL 0.5-2.5 (3) MG/3ML IN SOLN
3.0000 mL | Freq: Four times a day (QID) | RESPIRATORY_TRACT | Status: DC
  Administered 2018-01-07 (×3): 3 mL via RESPIRATORY_TRACT
  Filled 2018-01-07 (×3): qty 3

## 2018-01-07 MED ORDER — ENSURE ENLIVE PO LIQD
237.0000 mL | Freq: Two times a day (BID) | ORAL | Status: DC
  Administered 2018-01-08 – 2018-01-09 (×2): 237 mL via ORAL

## 2018-01-07 MED ORDER — RANOLAZINE ER 500 MG PO TB12
500.0000 mg | ORAL_TABLET | Freq: Two times a day (BID) | ORAL | Status: DC
  Administered 2018-01-07 – 2018-01-10 (×6): 500 mg via ORAL
  Filled 2018-01-07 (×6): qty 1

## 2018-01-07 MED ORDER — MILRINONE LACTATE IN DEXTROSE 20-5 MG/100ML-% IV SOLN
0.2500 ug/kg/min | INTRAVENOUS | Status: DC
  Administered 2018-01-07: 0.25 ug/kg/min via INTRAVENOUS
  Administered 2018-01-07 – 2018-01-10 (×6): 0.375 ug/kg/min via INTRAVENOUS
  Filled 2018-01-07 (×8): qty 100

## 2018-01-07 MED ORDER — SODIUM CHLORIDE 0.9% FLUSH: 10.0000 mL | INTRAVENOUS | Status: DC | PRN

## 2018-01-07 MED ORDER — INSULIN ASPART 100 UNIT/ML ~~LOC~~ SOLN
0.0000 [IU] | Freq: Three times a day (TID) | SUBCUTANEOUS | Status: DC
  Administered 2018-01-07 (×2): 5 [IU] via SUBCUTANEOUS
  Administered 2018-01-08: 3 [IU] via SUBCUTANEOUS
  Administered 2018-01-08: 5 [IU] via SUBCUTANEOUS
  Administered 2018-01-08: 3 [IU] via SUBCUTANEOUS
  Administered 2018-01-09 (×2): 5 [IU] via SUBCUTANEOUS

## 2018-01-07 MED ORDER — FUROSEMIDE 10 MG/ML IJ SOLN
10.0000 mg/h | INTRAVENOUS | Status: DC
  Administered 2018-01-07: 10 mg/h via INTRAVENOUS
  Filled 2018-01-07: qty 25
  Filled 2018-01-07: qty 20

## 2018-01-07 MED ORDER — FUROSEMIDE 10 MG/ML IJ SOLN
40.0000 mg | Freq: Once | INTRAMUSCULAR | Status: AC
  Administered 2018-01-07: 40 mg via INTRAVENOUS
  Filled 2018-01-07: qty 4

## 2018-01-07 MED ORDER — SODIUM CHLORIDE 0.9% FLUSH
10.0000 mL | Freq: Two times a day (BID) | INTRAVENOUS | Status: DC
  Administered 2018-01-07 – 2018-01-08 (×4): 10 mL
  Administered 2018-01-09: 20 mL
  Administered 2018-01-09: 10 mL
  Administered 2018-01-10: 20 mL

## 2018-01-07 NOTE — Progress Notes (Signed)
Patient admitted from ED. Alert and oriented. Currently complaining of chest discomfort. EKG to be done. On heparin drip at 29ml/hr. CCMD called and verified.Oriented to room and surrounding.

## 2018-01-07 NOTE — Progress Notes (Signed)
Provider called back and nebulizer treatment ordered. The incoming provider will come and reassess patient. Currently on 3L nasal cannula.

## 2018-01-07 NOTE — Progress Notes (Addendum)
Initial Nutrition Assessment  DOCUMENTATION CODES:   Not applicable  INTERVENTION:    Ensure Enlive po BID, each supplement provides 350 kcal and 20 grams of protein  NUTRITION DIAGNOSIS:   Inadequate oral intake related to acute illness, poor appetite as evidenced by per patient/family report  GOAL:   Patient will meet greater than or equal to 90% of their needs   MONITOR:   PO intake, Supplement acceptance, Labs, Skin, Weight trends  REASON FOR ASSESSMENT:   Consult Poor PO  ASSESSMENT:   84 yo Male with PMH of CAD, CKD and Type 2 DM; presented to ED with weakness and low blood pressure.  Pt laying on his side in bed. Minimally conversant. He reports a poor appetite. Only had orange juice this am. At home he typically consumes 4 small meals.  Pt also reveals he's lost about 20 lbs in the last 4 months. Question if this is fluid related. Doesn't drink any nutrition supplements at home but willing to try here. Labs and medications reviewed. CBG's [phone].  NUTRITION - FOCUSED PHYSICAL EXAM:  Unable to complete at this time.  Diet Order:  Diet 2 gram sodium Room service appropriate? Yes; Fluid consistency: Thin; Fluid restriction: 2000 mL Fluid  EDUCATION NEEDS:   No education needs have been identified at this time  Skin:  Skin Assessment: Reviewed RN Assessment  Last BM:  PTA  Height:   Ht Readings from Last 1 Encounters:  01/07/18 6' (1.829 m)    Weight:   Wt Readings from Last 1 Encounters:  01/07/18 191 lb 9.3 oz (86.9 kg)    Ideal Body Weight:  81 kg  BMI:  Body mass index is 25.98 kg/m.  Estimated Nutritional Needs:   Kcal:  1800-2000  Protein:  85-100 gm  Fluid:  1.8-2.0 L  Arthur Holms, RD, LDN Pager #: [phone] After-Hours Pager #: [phone]

## 2018-01-07 NOTE — Consult Note (Addendum)
Advanced Heart Failure Team Consult Note   Primary Physician: Dineen Kid, MD PCP-Cardiologist:  Kirk Ruths, MD  Reason for Consultation: Acute on chronic systolic CHF  HPI:    Randall Callahan is seen today for evaluation of Acute on chronic systolic CHF at the request of Dr. Gwenlyn Found.   Randall Callahan is a 84 y.o. male with a long, complicated history of CAD (proximal to mid LAD with total occlusion at distal segment of previous stent, proximal circumflex 99% occluded at previously placed stent, RCA with mild disease, ostial to mid ramus 90% stenosed with DESx1 to mid-distal ramus and DESx1 to proximal ramus on 10/02/2017), ICM with a EF of 30%, SSS s/p PPM, CKD stage II and DM 2.   Most recently admitted 12/25/17 for unstable angina where Ranexa was added to his medical regimen. This despite multiple stents as above.  Last seen in [facility] office 01/05/18 with recurrent chest pain.  He has had worsening chest pain since his last cath in 09/2017. Now having CP every other day. Discussed at length, and planned for LHC with CTO to the LAD on 01/12/18.  Pt developed profound weakness, hypotension, and a "very uneasy" feeling on 01/12/2018. He had become progressively SOB over the past several days. On arrival to ED, BP was 74/56. He denies CP, Back pain, numbness, or tingling in his arms or hands. Denied fevers, chills, syncope, or falls.   Pertinent labs on admission include 1.52, K 5.9, Mildly elevated troponins (chronic), BNP 615, WBC 8.0, Hgb 12.7, PCT negative. CXR with at least mild interstital edema.   He is currently uncomfortable. + orthopnea. +N/V. Coughing with some hemoptysis. He is SOB with minimal activity over the past several weeks. He has continued to have CP at least every other day. He rolls around in bed back and forth as we talk.   Echo 10/01/17 LVEF 30-35%, Grade 2 DD, PA peak pressure 48 mm Hg.   Cath 10/02/2017   Prox LAD lesion is 100% stenosed.  Ost Cx to Prox Cx  lesion is 100% stenosed.  Prox RCA lesion is 30% stenosed. Noted on prior cath. Right to left collaterals.  Ost Ramus to mid Ramus lesion is 90% stenosed.  A drug-eluting stent was successfully placed using a STENT SIERRA 3.00 X 38 MM, treating the mid to distal disease, postdilated to 3.4-3.5 mm using the 3.25 stent balloon and the 3.5 balloon more proximally.  A drug-eluting stent was successfully placed using a STENT SIERRA 3.25 X 12 MM, treating the proximal ramus, postdilated to 3.5 mm.  Post intervention, there is a 0% residual stenosis.  LV end diastolic pressure is normal.  There is no aortic valve stenosis.    Review of systems complete and found to be negative unless listed in HPI.    Home Medications Prior to Admission medications   Medication Sig Start Date End Date Taking? Authorizing Provider  aspirin EC 81 MG tablet Take 81 mg by mouth daily with breakfast.    Yes [provider]  atorvastatin (LIPITOR) 40 MG tablet Take 1 tablet (40 mg total) by mouth at bedtime. 10/03/17  Yes Regalado, Belkys A, MD  bifidobacterium infantis (ALIGN) capsule Take 1 capsule by mouth daily.   Yes [provider]  clopidogrel (PLAVIX) 75 MG tablet Take 1 tablet (75 mg total) by mouth daily with breakfast. 10/03/17  Yes Regalado, Belkys A, MD  guaiFENesin (ROBITUSSIN) 100 MG/5ML SOLN Take 15 mLs by mouth as needed for cough or  to loosen phlegm.   Yes [provider]  hydroxypropyl methylcellulose / hypromellose (ISOPTO TEARS / GONIOVISC) 2.5 % ophthalmic solution Place 2 drops into the right eye as needed for dry eyes.   Yes [provider]  isosorbide mononitrate (IMDUR) 60 MG 24 hr tablet Take 0.5 tablets (30 mg total) by mouth daily. 12/18/17  Yes [name], MD  lisinopril (PRINIVIL,ZESTRIL) 2.5 MG tablet Take 1 tablet (2.5 mg total) by mouth every evening. 10/22/17  Yes Erlene Quan, PA-C  metFORMIN (GLUCOPHAGE) 1000 MG tablet Take 1 tablet  (1,000 mg total) by mouth 2 (two) times daily with a meal. 12/30/17  Yes Reino Bellis B, NP  metoprolol succinate (TOPROL-XL) 25 MG 24 hr tablet Take 3 tablets (75 mg total) by mouth daily after breakfast. Take with or immediately following a meal. 11/25/17  Yes Crenshaw, Denice Bors, MD  nitroGLYCERIN (NITROSTAT) 0.4 MG SL tablet Place 1 tablet (0.4 mg total) under the tongue every 5 (five) minutes as needed for chest pain. Up to 3 doses for chest pain. If pain has not subsided after 3 doses go to the ER. 10/03/17  Yes Regalado, Belkys A, MD  Omega-3 Fatty Acids (FISH OIL PO) Take 300 mg by mouth every evening.    Yes [provider]  pantoprazole (PROTONIX) 40 MG tablet Take 1 tablet (40 mg total) by mouth daily. 01/05/18  Yes Meng, Isaac Laud, PA  polyethylene glycol (MIRALAX / GLYCOLAX) packet Take 17 g by mouth as needed for mild constipation.   Yes [provider]  ranitidine (ZANTAC) 150 MG tablet Take 150 mg by mouth as needed for heartburn.   Yes [provider]  ranolazine (RANEXA) 1000 MG SR tablet Take 1 tablet (1,000 mg total) by mouth 2 (two) times daily. 12/30/17  Yes Cheryln Manly, NP  Wheat Dextrin (BENEFIBER) POWD Take 15 mLs by mouth daily as needed (FOR FIBER).   Yes [provider]  glucose blood ([employer] TEST STRIPS) test strip 1 each by Other route daily. 12/30/17   Cheryln Manly, NP    Past Medical History: Past Medical History:  Diagnosis Date  . Angina pectoris, unstable ([place])   . Angiomyolipoma of kidney   . Arthritis    "hips and knees" (09/29/2017)  . ASCVD (arteriosclerotic cardiovascular disease)   . Bradycardia   . Cancer [facility]) 2002   "iliac crest"  . Cancer of skin, face    "several" (09/29/2017)  . Cardiomyopathy ([place])   . Chest pain   . Chest tightness   . Chronic lower back pain   . CKD (chronic kidney disease), stage III ([place])    [name] 09/29/2017  . Complication of anesthesia    "my urinary tract becomes irritated; takes  ~ 3-4 weeks to go away" (09/29/2017)  . Coronary artery disease   . Diverticulosis   . GERD (gastroesophageal reflux disease)   . High cholesterol   . Hx of sick sinus syndrome   . Hypertension   . MCI (mild cognitive impairment)   . MI (myocardial infarction) ([place]) 2003; ~ 12/2016;   . NSTEMI (non-ST elevation myocardial infarction) ([place]) 08/2016   [name] 09/29/2017  . Peripheral neuropathy   . Pneumonia 2000s X ?2  . Presence of permanent cardiac pacemaker 2015  . Schatzki's ring   . Squamous cell carcinoma in situ of skin   . Type II diabetes mellitus ([place])     Past Surgical History: Past Surgical History:  Procedure Laterality Date  .  CARDIAC CATHETERIZATION  >2003 X 2   "couldn't unblock the stents that were blocked"  . CATARACT EXTRACTION    . CATARACT EXTRACTION W/ INTRAOCULAR LENS IMPLANT Right 2000s  . COLONOSCOPY W/ BIOPSIES AND POLYPECTOMY    . CORONARY ANGIOGRAPHY    . CORONARY ANGIOPLASTY WITH STENT PLACEMENT  2003   "2 stents"  . CORONARY STENT INTERVENTION N/A 10/02/2017   Procedure: CORONARY STENT INTERVENTION;  Surgeon: Jettie Booze, MD;  Location: [place] [facility];  Service: Cardiovascular;  Laterality: N/A;  . ESOPHAGOGASTRODUODENOSCOPY    . EXCISIONAL HEMORRHOIDECTOMY    . INSERT / REPLACE / REMOVE PACEMAKER  2015   [name] 09/29/2017  . JOINT REPLACEMENT    . LEFT HEART CATH N/A 10/02/2017   Procedure: Left Heart Cath;  Surgeon: Jettie Booze, MD;  Location: [place] [facility];  Service: Cardiovascular;  Laterality: N/A;  . LEFT HEART CATH AND CORONARY ANGIOGRAPHY N/A 09/30/2017   Procedure: LEFT HEART CATH AND CORONARY ANGIOGRAPHY;  Surgeon: Wellington Hampshire, MD;  Location: [place] [facility];  Service: Cardiovascular;  Laterality: N/A;  . MASS EXCISION Right 2002   "iliac crest cancer"  . SKIN CANCER EXCISION     "facial"  . TONSILLECTOMY    . TOTAL KNEE ARTHROPLASTY Left ~ 2012  . VASCULAR SURGERY      Family History: Family History   Problem Relation Age of Onset  . Angina Mother   . Stroke Mother   . Other Father        Car accident  . Prostate cancer Brother   . Cancer Brother   . Macular degeneration Sister   . Amblyopia Neg Hx   . Blindness Neg Hx   . Cataracts Neg Hx   . Glaucoma Neg Hx   . Retinal detachment Neg Hx   . Strabismus Neg Hx   . Retinitis pigmentosa Neg Hx     Social History: Social History   Socioeconomic History  . Marital status: Divorced    Spouse name: None  . Number of children: 6  . Years of education: some college  . Highest education level: None  Social Needs  . Financial resource strain: None  . Food insecurity - worry: None  . Food insecurity - inability: None  . Transportation needs - medical: None  . Transportation needs - non-medical: None  Occupational History  . Occupation: Retired  Tobacco Use  . Smoking status: Former Smoker    Years: 2.00    Types: Cigarettes  . Smokeless tobacco: Never Used  . Tobacco comment: STARTED SMOKING IN 4TH GRADE, QUIT IN 6TH  Substance and Sexual Activity  . Alcohol use: Yes    Alcohol/week: 0.6 oz    Types: 1 Cans of beer per week  . Drug use: No  . Sexual activity: No  Other Topics Concern  . None  Social History Narrative   Lives at home with son and daughter-in-law.   Right-handed.   2 cups caffeine per day.    Allergies:  No Known Allergies  Objective:    Vital Signs:   Temp:  [97.9 F (36.6 C)-98.4 F (36.9 C)] 97.9 F (36.6 C) (03/14 0310) Pulse Rate:  [59-98] 63 (03/14 0907) Resp:  [11-30] 29 (03/14 0907) BP: (87-115)/(44-76) 100/58 (03/14 0907) SpO2:  [88 %-100 %] 92 % (03/14 0700) Weight:  [191 lb 9.3 oz (86.9 kg)] 191 lb 9.3 oz (86.9 kg) (03/14 0310)   Weight change: Filed Weights   01/14/2018 1120  01/07/18 0310  Weight: 190 lb (86.2 kg) 191 lb 9.3 oz (86.9 kg)   Intake/Output:   Intake/Output Summary (Last 24 hours) at 01/07/2018 1122 Last data filed at 01/07/2018 0837 Gross per 24 hour    Intake 1000 ml  Output 175 ml  Net 825 ml    Physical Exam    General:  Ill appearing. Uncomfortable in bed. Anxious.  HEENT: Normal Neck: supple. JVP to jaw. Carotids 2+ bilat; no bruits. No lymphadenopathy or thyromegaly appreciated. Cor: PMI nondisplaced. Regular. ? S3.  Lungs: Diminished throughout. Dependent crackles.  Abdomen: soft, nontender, distended. No hepatosplenomegaly. No bruits or masses. +BS.  Extremities: no cyanosis, clubbing, or rash. Trace to 1+ edema. Cool to the touch.  Neuro: alert & orientedx3, cranial nerves grossly intact. moves all 4 extremities w/o difficulty. Affect uncomfortable.   Telemetry   V paced 60-70s, personally reviewed.   EKG    V paced 66 bpm, personally reviewed  Labs   Basic Metabolic Panel: Recent Labs  Lab 12/31/2017 1145 01/07/2018 1729 01/05/2018 2008 01/07/18 0739  NA 132*  --   --  133*  K 5.9* 4.8  --  5.1  CL 101  --   --  105  CO2 19*  --   --  13*  GLUCOSE 160*  --   --  256*  BUN 29*  --   --  32*  CREATININE 1.52*  --  1.38* 1.77*  CALCIUM 9.1  --   --  9.3    Liver Function Tests: No results for input(s): AST, ALT, ALKPHOS, BILITOT, PROT, ALBUMIN in the last 168 hours. No results for input(s): LIPASE, AMYLASE in the last 168 hours. No results for input(s): AMMONIA in the last 168 hours.  CBC: Recent Labs  Lab 01/19/2018 1145 12/28/2017 2008 01/07/18 0425  WBC 8.4 8.0 13.2*  NEUTROABS 6.1  --   --   HGB 13.6 12.7* 14.5  HCT 40.4 37.9* 43.8  MCV 86.1 85.6 88.1  PLT 276 256 276    Cardiac Enzymes: Recent Labs  Lab 01/08/2018 1145 12/27/2017 2008 01/07/18 0425 01/07/18 0739  TROPONINI 0.18* 0.14* 0.17* 0.11*    BNP: BNP (last 3 results) Recent Labs    09/29/17 0109 12/25/17 0755 01/05/2018 2008  BNP 205.7* 421.3* 615.2*    ProBNP (last 3 results) No results for input(s): PROBNP in the last 8760 hours.   CBG: Recent Labs  Lab 01/18/2018 1246 01/07/18 0318 01/07/18 0644 01/07/18 1120  GLUCAP  137* 212* 245* 258*    Coagulation Studies: Recent Labs    01/13/2018 1145  LABPROT 14.2  INR 1.11     Imaging   Dg Chest 2 View  Result Date: 12/27/2017 CLINICAL DATA:  Hypotension.  Diabetes. EXAM: CHEST - 2 VIEW COMPARISON:  12/25/2017 FINDINGS: Pacemaker leads appear unchanged. Heart size upper limits of normal. Aortic atherosclerosis as seen previously. Increased interstitial prominence most consistent with mild interstitial edema. Small amount of fluid in the fissures and pleural space. Mild volume loss in the lower lobes. IMPRESSION: Slight worsening of interstitial markings most consistent with mild interstitial edema. Electronically Signed   By: Nelson Chimes M.D.   On: 12/29/2017 13:09      Medications:     Current Medications: . aspirin EC  81 mg Oral Q breakfast  . atorvastatin  40 mg Oral QHS  . clopidogrel  75 mg Oral Q breakfast  . insulin aspart  0-15 Units Subcutaneous TID WC  . ipratropium-albuterol  3  mL Nebulization Q6H  . metoprolol succinate  75 mg Oral QPC breakfast  . pantoprazole  40 mg Oral Daily  . ranolazine  1,000 mg Oral BID  . sodium chloride flush  3 mL Intravenous Q12H     Infusions: . sodium chloride    . heparin 1,000 Units/hr (01/07/18 7371)       Patient Profile   Dasean Brow is a 84 y.o. male with a long, complicated history of CAD (proximal to mid LAD with total occlusion at distal segment of previous stent, proximal circumflex 99% occluded at previously placed stent, RCA with mild disease, ostial to mid ramus 90% stenosed with DESx1 to mid-distal ramus and DESx1 to proximal ramus on 10/02/2018), ICM with a EF of 30%, SSS s/p PPM, CKD stage II and DM 2.   Admitted 12/31/2017 with weakness and hypotension. Admitted for further evaluation and treatment. HF team consulted.   Assessment/Plan   1.Acute systolic CHF, ICM with low output symptoms.  - Echo 10/01/17 LVEF 30-35%, Grade 2 DD, PA peak pressure 48 mm Hg.  -  Volume status  elevated on exam. He is profoundly orthopneic.  - Diuresis complicated by low output - Will place urgent consult for PICC line. If unable to place quickly. May need Central line. - Start milrinone 0.25 mcg/kg/min peripherally now.  - Give 40 mg IV lasix and then start on lasix gtt at 10 mg/hr.  - Hold BB with low output.  - No ACE/ARB/ARNI now with AKI and hypotension.  - Consider digoxin, hesitant with AKI.   2.Severe CAD s/p PCI - Proximal to mid LAD total occlusion at distal segment of previous stent, proximal circumflex99%occluded at previously placed stent,RCA with mild disease,ostial to mid ramus 90% stenosed, DESx1to mid-distal ramus and DESx1to proximal ramus per cath report from12/04/2018 - Patient is currently planned cardiac cath on 01/12/2018 with CTO of LAD per Dr. Irish Lack - Continue ASA 81, Plavix 75 mg daily, Statin, and Ranexa 1000 mg BID.  - On hep gtt currently.   3. Acute respiratory failure with hypoxia - Not on chronic O2.  - Satting > 92% on O2 via [place].   4. AKI - In setting of low output CHF.  - Follow daily with diuresis.   5. Hyperkalemia - Holding ACEi - K 5.1 this am. Follow.   6. DM 2: - Cover with SSI.   7. Leukocytosis:  - WBC 13.2 this am. Afebrile. Follow.  - CXR pending. PCT negative.  - With hemoptysis some concern for bronchitis.   Patient is critically ill and in early multi-system organ failure in the setting of low output heart failure.   Medication concerns reviewed with patient and pharmacy team. Barriers identified: None at this time.   Length of Stay: 0  Annamaria Helling  01/07/2018, 11:22 AM  Advanced Heart Failure Team Pager [phone] (M-F; 7a - 4p)  Please contact [facility] for night-coverage after hours (4p -7a ) and weekends on [email or web address]  Patient seen with PA, agree with the above note.  Admitted with progressive dyspnea and weakness, minimal chest pain.  CXR with pulmonary edema.  He was diuresed  with IV Lasix initially but has responded poorly and breathing has worsened.  Currently uncomfortable due to dyspnea.  Creatinine up to 1.7.  SBP 80s-100s.    On exam, extremities are cool.  JVP 12+ cm.  Heart sounds distant, regular.  Lungs with crackles at the bases.  No edema.   1. Acute on chronic  systolic CHF: Suspect low output/cardiogenic shock with cool extremities.  Echo in 12/18 with EF 30-35%, ischemic cardiomyopathy.  Poor UOP.   - Will need central access, PICC versus CVL.  - Stop Toprol XL .  - Start milrinone 0.25 mcg/kg/min empirically, strong suspicion for low output HF.  - After milrinone has been running for 30-45 minutes, start Lasix 40 mg IV x 1 then 10 mg/hr infusion.  Can increase as needed.  - Needs repeat echo to reassess EF.   - With age, not a candidate for advanced therapies.  2. CAD: 12/18 he had PCI to ramus.  Since 2008, he has had occluded LAD.  He has had chronic angina that worsens at times, so suspect he must have some viability in the LAD territory.  There has been consideration of opening LAD CTO.  Currently, not stable for this extensive procedure.  Suspect mildly elevated troponin with no trend is due to demand ischemia.  - Continue ASA, Plavix, statin, ranolazine 500 mg bid.  - Has PPM so cannot get MRI viability study.  - Will need to review with interventional colleagues after he has improved.  3. Hemoptysis: ?Related to pulmonary edema.  Can stop IV heparin gtt and transition to heparin [place].  4. AKI: Suspect this is cardiorenal in setting of low output HF.  5. Sick sinus syndrome: He has St Jude PPM and is RV pacing.  Ideally, would upgrade to CRT.   Loralie Champagne 01/07/2018 12:32 PM

## 2018-01-07 NOTE — Progress Notes (Signed)
Inpatient Diabetes Program Recommendations  AACE/ADA: New Consensus Statement on Inpatient Glycemic Control (2015)  Target Ranges:  Prepandial:   less than 140 mg/dL      Peak postprandial:   less than 180 mg/dL (1-2 hours)      Critically ill patients:  140 - 180 mg/dL   Results for Randall Callahan, Randall Callahan (MRN [id number]) as of 01/07/2018 13:25  Ref. Range 01/02/2018 12:46 01/07/2018 03:18 01/07/2018 06:44 01/07/2018 11:20  Glucose-Capillary Latest Ref Range: 65 - 99 mg/dL 137 (H) 212 (H) 245 (H) 258 (H)   Review of Glycemic Control  Diabetes history: DM 2 Outpatient Diabetes medications: Metformin 1000 mg BID Current orders for Inpatient glycemic control: Novolog Moderate Correction 0-15 units tid  Inpatient Diabetes Program Recommendations:    A1c 6.3% this admission  Glucose in 200's. Renal function elevated. Consider Levemir 9 units (0.1 units/kg). Due to renal function may need to reduce Novolog Correction scale to Sensitive 0-9 units.  Thanks,  Tama Headings RN, MSN, BC-ADM, [facility] Inpatient Diabetes Coordinator Team Pager [phone] (8a-5p)

## 2018-01-07 NOTE — Progress Notes (Signed)
Patient experiencing increased Dyspnea, restless and wheezing. MD on call notified. Currently on heparin drip for elevated troponin.

## 2018-01-07 NOTE — Progress Notes (Signed)
Peripherally Inserted Central Catheter/Midline Placement  The IV Nurse has discussed with the patient and/or persons authorized to consent for the patient, the purpose of this procedure and the potential benefits and risks involved with this procedure.  The benefits include less needle sticks, lab draws from the catheter, and the patient may be discharged home with the catheter. Risks include, but not limited to, infection, bleeding, blood clot (thrombus formation), and puncture of an artery; nerve damage and irregular heartbeat and possibility to perform a PICC exchange if needed/ordered by physician.  Alternatives to this procedure were also discussed.  Bard Power PICC patient education guide, fact sheet on infection prevention and patient information card has been provided to patient /or left at bedside.    PICC/Midline Placement Documentation  PICC Triple Lumen 01/07/18 PICC Right Brachial 45 cm 1 cm (Active)  Indication for Insertion or Continuance of Line Vasoactive infusions 01/07/2018  1:22 PM  Exposed Catheter (cm) 1 cm 01/07/2018  1:22 PM  Site Assessment Clean;Dry;Intact 01/07/2018  1:22 PM  Lumen #1 Status Flushed;Saline locked;Blood return noted 01/07/2018  1:22 PM  Lumen #2 Status Flushed;Saline locked;Blood return noted 01/07/2018  1:22 PM  Lumen #3 Status Flushed;Saline locked;Blood return noted 01/07/2018  1:22 PM  Dressing Type Transparent 01/07/2018  1:22 PM  Dressing Status Clean;Dry;Intact;Antimicrobial disc in place 01/07/2018  1:22 PM  Dressing Change Due 01/14/18 01/07/2018  1:22 PM       Gordan Payment 01/07/2018, 1:25 PM

## 2018-01-07 NOTE — Progress Notes (Signed)
ANTICOAGULATION CONSULT NOTE - Follow Up Consult  Pharmacy Consult for heparin Indication: chest pain/ACS  Labs: Recent Labs    01/22/2018 1145 01/03/2018 2008 01/07/18 0425  HGB 13.6 12.7* 14.5  HCT 40.4 37.9* 43.8  PLT 276 256 276  LABPROT 14.2  --   --   INR 1.11  --   --   HEPARINUNFRC  --   --  0.88*  CREATININE 1.52* 1.38*  --   TROPONINI 0.18* 0.14*  --     Assessment: 84yo male supratherapeutic on heparin with initial dosing for CP; RN notes IV site was bloody so she moved IV site and bleeding resolved, Hgb actually up from last lab draw; on recent admission pt required higher heparin rate for therapeutic level.  Goal of Therapy:  Heparin level 0.3-0.7 units/ml   Plan:  Will decrease heparin gtt by ~1 units/kg/hr to 1000 units/hr and check level in 8 hours.    Wynona Neat, PharmD, BCPS  01/07/2018,6:02 AM

## 2018-01-07 NOTE — Progress Notes (Addendum)
Progress Note  Patient Name: Randall Callahan Date of Encounter: 01/07/2018  Primary Cardiologist: Kirk Ruths, MD  Subjective   Had a rough night, had vomiting and shortness of breath. Some chest tightness.   Inpatient Medications    Scheduled Meds: . aspirin EC  81 mg Oral Q breakfast  . atorvastatin  40 mg Oral QHS  . clopidogrel  75 mg Oral Q breakfast  . ipratropium-albuterol  3 mL Nebulization Q6H  . isosorbide mononitrate  30 mg Oral Daily  . metoprolol succinate  75 mg Oral QPC breakfast  . pantoprazole  40 mg Oral Daily  . ranolazine  1,000 mg Oral BID  . sodium chloride flush  3 mL Intravenous Q12H   Continuous Infusions: . sodium chloride    . heparin 1,000 Units/hr (01/07/18 0605)   PRN Meds: sodium chloride, acetaminophen, famotidine, hydroxypropyl methylcellulose / hypromellose, ondansetron (ZOFRAN) IV, polyethylene glycol, sodium chloride flush   Vital Signs    Vitals:   01/07/18 0310 01/07/18 0600 01/07/18 0629 01/07/18 0700  BP: (!) 94/57     Pulse: 63     Resp: 18     Temp: 97.9 F (36.6 C)     TempSrc: Oral     SpO2: 96% 94% (!) 88% 92%  Weight: 191 lb 9.3 oz (86.9 kg)     Height: 6' (1.829 m)       Intake/Output Summary (Last 24 hours) at 01/07/2018 0742 Last data filed at 12/26/2017 2215 Gross per 24 hour  Intake 1000 ml  Output 175 ml  Net 825 ml   Filed Weights   01/09/2018 1120 01/07/18 0310  Weight: 190 lb (86.2 kg) 191 lb 9.3 oz (86.9 kg)    Telemetry    AV paced - Personally Reviewed  ECG    AV paced - Personally Reviewed  Physical Exam   General: Older W male appearing in no acute distress, wearing .  Head: Normocephalic, atraumatic.  Neck: Supple without bruits, JVD. Lungs:  Resp regular and unlabored, crackles bilaterally. Heart: RRR, S1, S2, no S3, S4, or murmur; no rub. Abdomen: Soft, non-tender, non-distended with normoactive bowel sounds.  Extremities: No clubbing, cyanosis, edema. Distal pedal pulses are  2+ bilaterally. Neuro: Alert and oriented X 3. Moves all extremities spontaneously. Psych: Normal affect.  Labs    Chemistry Recent Labs  Lab 01/19/2018 1145 01/17/2018 1729 01/16/2018 2008  NA 132*  --   --   K 5.9* 4.8  --   CL 101  --   --   CO2 19*  --   --   GLUCOSE 160*  --   --   BUN 29*  --   --   CREATININE 1.52*  --  1.38*  CALCIUM 9.1  --   --   GFRNONAA 39*  --  44*  GFRAA 45*  --  51*  ANIONGAP 12  --   --      Hematology Recent Labs  Lab 01/15/2018 1145 01/17/2018 2008 01/07/18 0425  WBC 8.4 8.0 13.2*  RBC 4.69 4.43 4.97  HGB 13.6 12.7* 14.5  HCT 40.4 37.9* 43.8  MCV 86.1 85.6 88.1  MCH 29.0 28.7 29.2  MCHC 33.7 33.5 33.1  RDW 15.8* 16.0* 16.2*  PLT 276 256 276    Cardiac Enzymes Recent Labs  Lab 01/01/2018 1145 01/24/2018 2008 01/07/18 0425  TROPONINI 0.18* 0.14* 0.17*   No results for input(s): TROPIPOC in the last 168 hours.   BNP Recent Labs  Lab 12/27/2017  2008  BNP 615.2*     DDimer No results for input(s): DDIMER in the last 168 hours.    Radiology    Dg Chest 2 View  Result Date: 12/25/2017 CLINICAL DATA:  Hypotension.  Diabetes. EXAM: CHEST - 2 VIEW COMPARISON:  12/25/2017 FINDINGS: Pacemaker leads appear unchanged. Heart size upper limits of normal. Aortic atherosclerosis as seen previously. Increased interstitial prominence most consistent with mild interstitial edema. Small amount of fluid in the fissures and pleural space. Mild volume loss in the lower lobes. IMPRESSION: Slight worsening of interstitial markings most consistent with mild interstitial edema. Electronically Signed   By: Nelson Chimes M.D.   On: 01/16/2018 13:09    Cardiac Studies   N/a   Patient Profile     84 y.o. male with a long, complicated history of CAD (proximal to mid LAD with total occlusion at distal segment of previous stent, proximal circumflex 99% occluded at previously placed stent, RCA with mild disease, ostial to mid ramus 90% stenosed with DESx1 to  mid-distal ramus and DESx1 to proximal ramus on 10/02/2018), ICM with a EF of 30%, SSS s/p PPM, CKD stage II and DM 2 who presented with weakness.  Assessment & Plan    1.  Hypotension: Had readings of while in the ED 99/62, 99/61,102/66 -Per patient report BP at home was 74/56, most likely why he has been symptomatic. Given 1 L NS given in ED. Blood pressures remains stable this morning with systolic in the 38-250 range.  2.  Known severe CAD status post PCI/elevated troponin: -Proximal to mid LAD total occlusion at distal segment of previous stent, proximal circumflex 99% occluded at previously placed stent, RCA with mild disease, ostial to mid ramus 90% stenosed, DESx1 to mid-distal ramus and DESx1 to proximal ramus per cath report from 10/02/2018 -Patient for planned cardiac cath on 01/12/2018 with CTO of LAD per Dr. Irish Lack -ASA 81, Plavix 75, statin, Ranexa 1000 mg BID -Hep gtt per pharmacy, troponin with flat trend  3.  Ischemic cardiomyopathy: Known EF of 30-35% but have been limited in titration of medical therapy with his soft blood pressures. This morning he reports being more short of breath, crackles on exam.  - repeat chest xray - give IV lasix 20mg  x1, watching his blood pressure. - on Toprol XL 75mg  daily, will hold Imdur to allow blood pressure room for lasix.  - AHF consulted yesterday to assist with better management of this acute on chronic HF excerebration. - net +825 mL, weight up 1lb today.   4.  Acute Kidney injury/Hyperkalemia: -improved today at 1.38. Needs lasix given respiratory status - BMET in the am - ACEi held on admission  6.  DM 2: -On home metformin -Glucose on admission, 160 -Hold metformin , start SSI  7. Leukocytosis: WBC 13.2 today, though afebrile. Could be 2/2 to acute HF exacerbation.  - repeat CXR - check procalcitonin  Signed, Reino Bellis, NP  01/07/2018, 7:42 AM  Pager # [phone]   For questions or updates, please contact [place] Please consult [email or web address] for contact info under Cardiology/STEMI.   Agree with note by Reino Bellis NP-C  Mr Raz was admitted for low output symptoms. He was relatively hypotensive as well although he did receive fluid resuscitation in the ER and on the floor. He has ischemic cardiomyopathy with pacemaker insertion, occluded LAD and circumflex status post ramus branch PCI and drug-eluting stenting by Dr. Irish Lack in December. He plan was to performed LAD CTO intervention  on 01/12/18. His BNP is moderately elevated. He is markedly dyspneic and volume overloaded with elevated JVD and left basilar crackles. His white count is mildly elevated as well. He received Lasix this morning.He may need IV inotropes to mobilize fluid. I've asked the advanced heart failure team to see him and advise on additional therapy.  Lorretta Harp, M.D., [place], [facility], [place] [address] [place], [place]  [address]  [phone] 01/07/2018 9:17 AM

## 2018-01-08 ENCOUNTER — Encounter (HOSPITAL_COMMUNITY): Payer: Medicare Other

## 2018-01-08 DIAGNOSIS — I442 Atrioventricular block, complete: Secondary | ICD-10-CM

## 2018-01-08 DIAGNOSIS — I5023 Acute on chronic systolic (congestive) heart failure: Secondary | ICD-10-CM

## 2018-01-08 LAB — CBC
HEMATOCRIT: 34.9 % — AB (ref 39.0–52.0)
HEMOGLOBIN: 12.2 g/dL — AB (ref 13.0–17.0)
MCH: 29.3 pg (ref 26.0–34.0)
MCHC: 35 g/dL (ref 30.0–36.0)
MCV: 83.9 fL (ref 78.0–100.0)
Platelets: 223 10*3/uL (ref 150–400)
RBC: 4.16 MIL/uL — ABNORMAL LOW (ref 4.22–5.81)
RDW: 15.5 % (ref 11.5–15.5)
WBC: 12.1 10*3/uL — ABNORMAL HIGH (ref 4.0–10.5)

## 2018-01-08 LAB — BASIC METABOLIC PANEL
ANION GAP: 12 (ref 5–15)
BUN: 39 mg/dL — AB (ref 6–20)
CHLORIDE: 97 mmol/L — AB (ref 101–111)
CO2: 21 mmol/L — ABNORMAL LOW (ref 22–32)
Calcium: 8.7 mg/dL — ABNORMAL LOW (ref 8.9–10.3)
Creatinine, Ser: 1.75 mg/dL — ABNORMAL HIGH (ref 0.61–1.24)
GFR calc Af Amer: 38 mL/min — ABNORMAL LOW (ref >60)
GFR, EST NON AFRICAN AMERICAN: 33 mL/min — AB (ref >60)
GLUCOSE: 278 mg/dL — AB (ref 65–99)
POTASSIUM: 3.6 mmol/L (ref 3.5–5.1)
Sodium: 130 mmol/L — ABNORMAL LOW (ref 135–145)

## 2018-01-08 LAB — GLUCOSE, CAPILLARY
GLUCOSE-CAPILLARY: 173 mg/dL — AB (ref 65–99)
GLUCOSE-CAPILLARY: 161 mg/dL — AB (ref 65–99)
Glucose-Capillary: 201 mg/dL — ABNORMAL HIGH (ref 65–99)
Glucose-Capillary: 210 mg/dL — ABNORMAL HIGH (ref 65–99)

## 2018-01-08 LAB — COOXEMETRY PANEL
CARBOXYHEMOGLOBIN: 1.2 % (ref 0.5–1.5)
Methemoglobin: 1.4 % (ref 0.0–1.5)
O2 SAT: 60.3 %
Total hemoglobin: 12.2 g/dL (ref 12.0–16.0)

## 2018-01-08 MED ORDER — FUROSEMIDE 40 MG PO TABS
40.0000 mg | ORAL_TABLET | Freq: Every day | ORAL | Status: DC
Start: 2018-01-08 — End: 2018-01-10
  Administered 2018-01-08 – 2018-01-09 (×2): 40 mg via ORAL
  Filled 2018-01-08 (×2): qty 1

## 2018-01-08 MED ORDER — IPRATROPIUM-ALBUTEROL 0.5-2.5 (3) MG/3ML IN SOLN
3.0000 mL | Freq: Three times a day (TID) | RESPIRATORY_TRACT | Status: DC
  Administered 2018-01-08 – 2018-01-10 (×5): 3 mL via RESPIRATORY_TRACT
  Filled 2018-01-08 (×6): qty 3

## 2018-01-08 MED ORDER — IPRATROPIUM-ALBUTEROL 0.5-2.5 (3) MG/3ML IN SOLN: 3.0000 mL | Freq: Four times a day (QID) | RESPIRATORY_TRACT | Status: DC | PRN

## 2018-01-08 MED ORDER — IPRATROPIUM-ALBUTEROL 0.5-2.5 (3) MG/3ML IN SOLN
3.0000 mL | Freq: Four times a day (QID) | RESPIRATORY_TRACT | Status: DC
  Administered 2018-01-08 (×2): 3 mL via RESPIRATORY_TRACT
  Filled 2018-01-08 (×2): qty 3

## 2018-01-08 NOTE — Consult Note (Signed)
ELECTROPHYSIOLOGY CONSULT NOTE    Patient ID: Randall Callahan MRN: [id number], DOB/AGE: 1928/06/20 84 y.o.  Admit date: 01/10/2018 Date of Consult: 01/08/2018  Primary Physician: Dineen Kid, MD Primary Cardiologist: Stanford Breed Heart Failure: Aundra Dubin Electrophysiologist: Curt Bears  Patient Profile: Randall Callahan is a 84 y.o. male with a history of CAD (complicated history with refractory angina and planned CTO), chronic systolic heart failure, complete heart block s/p PPM, CKD, and diabetes who is being seen today for the evaluation of potential CRT upgrade at the request of Dr Aundra Dubin.  HPI:  Randall Callahan is a 84 y.o. male with the above past medical history. His biggest issue has been recurrent angina and plans were for CTO. The patient presented to the hospital yesterday with weakness and hypotension. He was found to be in low output heart failure and Milrinone was started.  He is 100% RV paced 2/2 underlying heart block.  EP has been asked to evaluate for CRT upgrade.    He has also become increasingly tachycardic with increase in Milrinone.    He has not had chest pain over the last couple of days.  He has not had recent fevers or chills.   Past Medical History:  Diagnosis Date  . Angina pectoris, unstable ([place])   . Angiomyolipoma of kidney   . Arthritis    "hips and knees" (09/29/2017)  . ASCVD (arteriosclerotic cardiovascular disease)   . Bradycardia   . Cancer [facility]) 2002   "iliac crest"  . Cancer of skin, face    "several" (09/29/2017)  . Cardiomyopathy ([place])   . Chest pain   . Chest tightness   . Chronic lower back pain   . CKD (chronic kidney disease), stage III ([place])    [name] 09/29/2017  . Complication of anesthesia    "my urinary tract becomes irritated; takes ~ 3-4 weeks to go away" (09/29/2017)  . Coronary artery disease   . Diverticulosis   . GERD (gastroesophageal reflux disease)   . High cholesterol   . Hx of sick sinus syndrome   . Hypertension   .  MCI (mild cognitive impairment)   . MI (myocardial infarction) ([place]) 2003; ~ 12/2016;   . NSTEMI (non-ST elevation myocardial infarction) ([place]) 08/2016   [name] 09/29/2017  . Peripheral neuropathy   . Pneumonia 2000s X ?2  . Presence of permanent cardiac pacemaker 2015  . Schatzki's ring   . Squamous cell carcinoma in situ of skin   . Type II diabetes mellitus ([place])      Surgical History:  Past Surgical History:  Procedure Laterality Date  . CARDIAC CATHETERIZATION  >2003 X 2   "couldn't unblock the stents that were blocked"  . CATARACT EXTRACTION    . CATARACT EXTRACTION W/ INTRAOCULAR LENS IMPLANT Right 2000s  . COLONOSCOPY W/ BIOPSIES AND POLYPECTOMY    . CORONARY ANGIOGRAPHY    . CORONARY ANGIOPLASTY WITH STENT PLACEMENT  2003   "2 stents"  . CORONARY STENT INTERVENTION N/A 10/02/2017   Procedure: CORONARY STENT INTERVENTION;  Surgeon: Jettie Booze, MD;  Location: [place] [facility];  Service: Cardiovascular;  Laterality: N/A;  . ESOPHAGOGASTRODUODENOSCOPY    . EXCISIONAL HEMORRHOIDECTOMY    . INSERT / REPLACE / REMOVE PACEMAKER  2015   [name] 09/29/2017  . JOINT REPLACEMENT    . LEFT HEART CATH N/A 10/02/2017   Procedure: Left Heart Cath;  Surgeon: Jettie Booze, MD;  Location: [place] [facility];  Service: Cardiovascular;  Laterality: N/A;  . LEFT HEART  CATH AND CORONARY ANGIOGRAPHY N/A 09/30/2017   Procedure: LEFT HEART CATH AND CORONARY ANGIOGRAPHY;  Surgeon: Wellington Hampshire, MD;  Location: [place] [facility];  Service: Cardiovascular;  Laterality: N/A;  . MASS EXCISION Right 2002   "iliac crest cancer"  . SKIN CANCER EXCISION     "facial"  . TONSILLECTOMY    . TOTAL KNEE ARTHROPLASTY Left ~ 2012  . VASCULAR SURGERY       Medications Prior to Admission  Medication Sig Dispense Refill Last Dose  . aspirin EC 81 MG tablet Take 81 mg by mouth daily with breakfast.    12/27/2017 at Unknown time  . atorvastatin (LIPITOR) 40 MG tablet Take 1 tablet (40 mg  total) by mouth at bedtime. 30 tablet 0 01/05/2018 at Unknown time  . bifidobacterium infantis (ALIGN) capsule Take 1 capsule by mouth daily.   Past Week at Unknown time  . clopidogrel (PLAVIX) 75 MG tablet Take 1 tablet (75 mg total) by mouth daily with breakfast. 30 tablet 0 01/20/2018 at 0530  . guaiFENesin (ROBITUSSIN) 100 MG/5ML SOLN Take 15 mLs by mouth as needed for cough or to loosen phlegm.   01/01/2018  . hydroxypropyl methylcellulose / hypromellose (ISOPTO TEARS / GONIOVISC) 2.5 % ophthalmic solution Place 2 drops into the right eye as needed for dry eyes.   Past Week at Unknown time  . isosorbide mononitrate (IMDUR) 60 MG 24 hr tablet Take 0.5 tablets (30 mg total) by mouth daily. 30 tablet 2 01/10/2018 at 0530  . lisinopril (PRINIVIL,ZESTRIL) 2.5 MG tablet Take 1 tablet (2.5 mg total) by mouth every evening. 30 tablet 0 01/05/2018 at Unknown time  . metFORMIN (GLUCOPHAGE) 1000 MG tablet Take 1 tablet (1,000 mg total) by mouth 2 (two) times daily with a meal. 60 tablet 0 01/15/2018 at Unknown time  . metoprolol succinate (TOPROL-XL) 25 MG 24 hr tablet Take 3 tablets (75 mg total) by mouth daily after breakfast. Take with or immediately following a meal. 270 tablet 1 01/08/2018 at 0530  . nitroGLYCERIN (NITROSTAT) 0.4 MG SL tablet Place 1 tablet (0.4 mg total) under the tongue every 5 (five) minutes as needed for chest pain. Up to 3 doses for chest pain. If pain has not subsided after 3 doses go to the ER. 30 tablet 1 Past Week at Unknown time  . Omega-3 Fatty Acids (FISH OIL PO) Take 300 mg by mouth every evening.    01/22/2018 at Unknown time  . pantoprazole (PROTONIX) 40 MG tablet Take 1 tablet (40 mg total) by mouth daily. 30 tablet 11 01/17/2018 at Unknown time  . polyethylene glycol (MIRALAX / GLYCOLAX) packet Take 17 g by mouth as needed for mild constipation.   12/31/2017  . ranitidine (ZANTAC) 150 MG tablet Take 150 mg by mouth as needed for heartburn.   12/31/2017  . ranolazine (RANEXA) 1000 MG  SR tablet Take 1 tablet (1,000 mg total) by mouth 2 (two) times daily. 60 tablet 1 01/20/2018 at Unknown time  . Wheat Dextrin (BENEFIBER) POWD Take 15 mLs by mouth daily as needed (FOR FIBER).   Past Week at Unknown time  . glucose blood ([employer] TEST STRIPS) test strip 1 each by Other route daily. 50 each 0 Taking    Inpatient Medications:  . aspirin EC  81 mg Oral Q breakfast  . atorvastatin  40 mg Oral QHS  . clopidogrel  75 mg Oral Q breakfast  . feeding supplement (ENSURE ENLIVE)  237 mL Oral BID BM  .  furosemide  40 mg Oral Daily  . insulin aspart  0-15 Units Subcutaneous TID WC  . ipratropium-albuterol  3 mL Nebulization QID  . pantoprazole  40 mg Oral Daily  . ranolazine  500 mg Oral BID  . sodium chloride flush  10-40 mL Intracatheter Q12H  . sodium chloride flush  3 mL Intravenous Q12H    Allergies: No Known Allergies  Social History   Socioeconomic History  . Marital status: Divorced    Spouse name: Not on file  . Number of children: 6  . Years of education: some college  . Highest education level: Not on file  Social Needs  . Financial resource strain: Not on file  . Food insecurity - worry: Not on file  . Food insecurity - inability: Not on file  . Transportation needs - medical: Not on file  . Transportation needs - non-medical: Not on file  Occupational History  . Occupation: Retired  Tobacco Use  . Smoking status: Former Smoker    Years: 2.00    Types: Cigarettes  . Smokeless tobacco: Never Used  . Tobacco comment: STARTED SMOKING IN 4TH GRADE, QUIT IN 6TH  Substance and Sexual Activity  . Alcohol use: Yes    Alcohol/week: 0.6 oz    Types: 1 Cans of beer per week  . Drug use: No  . Sexual activity: No  Other Topics Concern  . Not on file  Social History Narrative   Lives at home with son and daughter-in-law.   Right-handed.   2 cups caffeine per day.     Family History  Problem Relation Age of Onset  . Angina Mother   . Stroke Mother   .  Other Father        Car accident  . Prostate cancer Brother   . Cancer Brother   . Macular degeneration Sister   . Amblyopia Neg Hx   . Blindness Neg Hx   . Cataracts Neg Hx   . Glaucoma Neg Hx   . Retinal detachment Neg Hx   . Strabismus Neg Hx   . Retinitis pigmentosa Neg Hx      Review of Systems: All other systems reviewed and are otherwise negative except as noted above.  Physical Exam: Vitals:   01/08/18 0600 01/08/18 0758 01/08/18 0800 01/08/18 0814  BP: 92/66  (!) 102/54   Pulse: 86  89   Resp: 19  (!) 24   Temp:  (!) 97.5 F (36.4 C)    TempSrc:  Oral    SpO2: 92%  92% 95%  Weight:      Height:        GEN- The patient is elderly and chronically ill appearing, alert and oriented x 3 today.   HEENT: normocephalic, atraumatic; sclera clear, conjunctiva pink; hearing intact; oropharynx clear; neck supple Lungs- Clear to ausculation bilaterally, normal work of breathing.  No wheezes, rales, rhonchi Heart- Regular rate and rhythm (paced) GI- soft, non-tender, non-distended, bowel sounds present Extremities- no clubbing, cyanosis, or edema  MS- no significant deformity or atrophy Skin- warm and dry, no rash or lesion Psych- euthymic mood, full affect Neuro- strength and sensation are intact  Labs:   Lab Results  Component Value Date   WBC 12.1 (H) 01/08/2018   HGB 12.2 (L) 01/08/2018   HCT 34.9 (L) 01/08/2018   MCV 83.9 01/08/2018   PLT 223 01/08/2018    Recent Labs  Lab 01/08/18 0533  NA 130*  K 3.6  CL 97*  CO2 21*  BUN 39*  CREATININE 1.75*  CALCIUM 8.7*  GLUCOSE 278*      Radiology/Studies: Dg Chest 2 View  Result Date: 12/26/2017 CLINICAL DATA:  Hypotension.  Diabetes. EXAM: CHEST - 2 VIEW COMPARISON:  12/25/2017 FINDINGS: Pacemaker leads appear unchanged. Heart size upper limits of normal. Aortic atherosclerosis as seen previously. Increased interstitial prominence most consistent with mild interstitial edema. Small amount of fluid in the  fissures and pleural space. Mild volume loss in the lower lobes. IMPRESSION: Slight worsening of interstitial markings most consistent with mild interstitial edema. Electronically Signed   By: Nelson Chimes M.D.   On: 01/03/2018 13:09   Dg Chest 2 View  Result Date: 12/25/2017 CLINICAL DATA:  Bradycardia EXAM: CHEST  2 VIEW COMPARISON:  October 02, 2017 FINDINGS: Lungs are hyperexpanded with scarring in the lower lung zones. There is no appreciable edema or consolidation. Heart size and pulmonary vascularity are within normal limits. Pacemaker leads are attached the right atrium and right ventricle. There is degenerative change in the thoracic spine and shoulders. IMPRESSION: Lungs hyperexpanded with lower lobe scarring. No edema or consolidation. Heart size normal. No adenopathy evident. Pacemaker leads attached to right atrium and right ventricle. Electronically Signed   By: Lowella Grip III M.D.   On: 12/25/2017 08:09   Dg Chest Port 1 View  Result Date: 01/07/2018 CLINICAL DATA:  Status post right-sided PICC line placement. EXAM: PORTABLE CHEST 1 VIEW COMPARISON:  Radiograph of same day. FINDINGS: Stable cardiomediastinal silhouette. Left-sided pacemaker is unchanged in position. Interval placement of right-sided PICC line with distal tip in expected position of the SVC. No pneumothorax is noted. Stable bilateral interstitial densities are noted concerning for edema or inflammation, left greater than right. Bony thorax is unremarkable. IMPRESSION: Interval placement of right-sided PICC line with distal tip in expected position of the SVC. Stable bilateral interstitial densities are noted concerning for edema or inflammation. Electronically Signed   By: Marijo Conception, M.D.   On: 01/07/2018 13:39   Dg Chest Port 1 View  Result Date: 01/07/2018 CLINICAL DATA:  Shortness of breath, hemoptysis EXAM: PORTABLE CHEST 1 VIEW COMPARISON:  Chest x-ray of 01/16/2018 and 12/25/2017 FINDINGS: There has been  further worsening of interstitial markings throughout the lungs most consistent with interstitial edema. Pneumonia as viral pneumonia cannot be excluded. No definite pleural effusion is seen. Mild cardiomegaly is stable and permanent pacemaker remains. No acute bony abnormality is seen. IMPRESSION: 1. Probable interstitial edema. Cannot exclude pneumonia such as viral pneumonia. 2. Stable cardiomegaly with pacer. Electronically Signed   By: Ivar Drape M.D.   On: 01/07/2018 12:26   [place] Ekg Site Rite  Result Date: 01/07/2018 If Site Rite image not attached, placement could not be confirmed due to current cardiac rhythm.   IHK:[id number] rhythm with RV pacing (personally reviewed)  TELEMETRY: sinus rhythm/sinus tach with RV pacing  (personally reviewed)  DEVICE HISTORY: STJ dual chamber PPM implanted 2015 for SSS, now with heart block   Assessment/Plan: 1.  Acute on chronic systolic heart failure/ complete heart block/ CAD/ ischemic CM  The patient presets with ongoing issues with heart failure in the setting of unrevascularized CAD and renal disease.  Given his advanced age, fragility, renal failure, and unrevascularized CAD, his overall prognosis is very poor.  EP is asked to see regarding possible upgrade of his device to CRT-P.  Certainly, given his advanced age, he is not an ICD candidate.  I worry also about the risks of upgrade  to CRT-P in this patient.   Replace study suggests an 85% complication rate nationally in patients much less sick than this one.  I would advise medical management at this time.  Plans for possible CTO revascularization are noted.  Elevated heart rate is currently sinus rhythm in the setting of milrinone.  This should improve as his milrinone is weaned. Ultimately, palliative options may be necessary. Once off milrinone and more stable, Dr Curt Bears to see to make further decisions about possibility of device upgrade as he is the patient's primary EP.  Thompson Grayer MD,  [facility] 01/09/2018 7:55 AM

## 2018-01-08 NOTE — Progress Notes (Signed)
Inpatient Diabetes Program Recommendations  AACE/ADA: New Consensus Statement on Inpatient Glycemic Control (2015)  Target Ranges:  Prepandial:   less than 140 mg/dL      Peak postprandial:   less than 180 mg/dL (1-2 hours)      Critically ill patients:  140 - 180 mg/dL   Results for DELONTAE, LAMM (MRN [id number]) as of 01/08/2018 10:30  Ref. Range 01/07/2018 06:44 01/07/2018 11:20 01/07/2018 14:32 01/07/2018 16:49 01/07/2018 20:58 01/08/2018 07:57  Glucose-Capillary Latest Ref Range: 65 - 99 mg/dL 245 (H)  Novolog 5 units 258 (H) 233 (H) 245 (H)  Novolog 5 units 188 (H) 173 (H)  Novolog 3 units   Review of Glycemic Control  Diabetes history: DM2 Outpatient Diabetes medications: Metformin 1000 mg BID Current orders for Inpatient glycemic control: Novolog 0-15 units TID with meals  Inpatient Diabetes Program Recommendations: Correction (SSI): Please consider ordering Novolog 0-5 units QHS for bedtime correction scale. Diet: Added Carb Mod to 2 gram Sodium restricted diet.  Basal: If glucose remains consistently greater than 180 mg/dl, please consider ordering low dose basal insulin (Levemir 8 units daily).  Thanks, Barnie Alderman, RN, MSN, CDE Diabetes Coordinator Inpatient Diabetes Program [phone] (Team Pager from 8am to 5pm)

## 2018-01-08 NOTE — Progress Notes (Signed)
Palliative Medicine consult noted. Due to high referral volume, there may be a delay seeing this patient. Please call the Palliative Medicine Team office at [phone] if recommendations are needed in the interim.  Thank you for inviting [place] to see this patient.  Marjie Skiff Lolitha Tortora, RN, BSN, [facility] Palliative Medicine Team 01/08/2018 9:08 AM Office [phone]

## 2018-01-08 NOTE — Progress Notes (Signed)
Patient ID: Randall Callahan, male   DOB: 1927/12/23, 84 y.o.   MRN: [id number]     Advanced Heart Failure Rounding Note  PCP-Cardiologist: Kirk Ruths, MD  [facility]  Subjective:    Patient empirically started on milrinone due to concern for low output on 3/14.  Initial co-ox was 49%.  Milrinone increased to 0.375 and co-ox this morning is 60%.  He was diuresed with Lasix gtt yesterday, CVP 8 this morning.  Good UOP, weight down. Creatinine stable at 1.75.    He feels better on milrinone.  No further nausea or dyspnea. SBP 90s.   Objective:   Weight Range: 191 lb 5.8 oz (86.8 kg) Body mass index is 25.95 kg/m.   Vital Signs:   Temp:  [98 F (36.7 C)-98.1 F (36.7 C)] 98.1 F (36.7 C) (03/15 0403) Pulse Rate:  [57-86] 86 (03/15 0600) Resp:  [19-29] 19 (03/15 0600) BP: (75-125)/(41-88) 92/66 (03/15 0600) SpO2:  [90 %-96 %] 92 % (03/15 0600) Weight:  [191 lb 5.8 oz (86.8 kg)-195 lb 8.8 oz (88.7 kg)] 191 lb 5.8 oz (86.8 kg) (03/15 0500) Last BM Date: 01/04/18  Weight change: Filed Weights   01/07/18 0310 01/07/18 1614 01/08/18 0500  Weight: 191 lb 9.3 oz (86.9 kg) 195 lb 8.8 oz (88.7 kg) 191 lb 5.8 oz (86.8 kg)    Intake/Output:   Intake/Output Summary (Last 24 hours) at 01/08/2018 0747 Last data filed at 01/08/2018 0600 Gross per 24 hour  Intake 530.31 ml  Output 1975 ml  Net -1444.69 ml      Physical Exam    General:  Well appearing. No resp difficulty HEENT: Normal Neck: Supple. JVP not elevated.  No lymphadenopathy or thyromegaly appreciated. Cor: PMI lateral. Regular rate & rhythm. No rubs, gallops or murmurs. Lungs: Clear Abdomen: Soft, nontender, nondistended. No hepatosplenomegaly. No bruits or masses. Good bowel sounds. Extremities: No cyanosis, clubbing, rash, edema Neuro: Alert & orientedx3, cranial nerves grossly intact. moves all 4 extremities w/o difficulty. Affect pleasant   Telemetry   A-paced, v-paced (personally reviewed)   Labs      CBC Recent Labs    01/15/2018 1145  01/07/18 0425 01/08/18 0533  WBC 8.4   < > 13.2* 12.1*  NEUTROABS 6.1  --   --   --   HGB 13.6   < > 14.5 12.2*  HCT 40.4   < > 43.8 34.9*  MCV 86.1   < > 88.1 83.9  PLT 276   < > 276 223   < > = values in this interval not displayed.   Basic Metabolic Panel Recent Labs    01/07/18 0739 01/08/18 0533  NA 133* 130*  K 5.1 3.6  CL 105 97*  CO2 13* 21*  GLUCOSE 256* 278*  BUN 32* 39*  CREATININE 1.77* 1.75*  CALCIUM 9.3 8.7*   Liver Function Tests No results for input(s): AST, ALT, ALKPHOS, BILITOT, PROT, ALBUMIN in the last 72 hours. No results for input(s): LIPASE, AMYLASE in the last 72 hours. Cardiac Enzymes Recent Labs    01/14/2018 2008 01/07/18 0425 01/07/18 0739  TROPONINI 0.14* 0.17* 0.11*    BNP: BNP (last 3 results) Recent Labs    09/29/17 0109 12/25/17 0755 12/28/2017 2008  BNP 205.7* 421.3* 615.2*    ProBNP (last 3 results) No results for input(s): PROBNP in the last 8760 hours.   D-Dimer No results for input(s): DDIMER in the last 72 hours. Hemoglobin A1C Recent Labs    01/07/18 0740  HGBA1C 6.3*   Fasting Lipid Panel No results for input(s): CHOL, HDL, LDLCALC, TRIG, CHOLHDL, LDLDIRECT in the last 72 hours. Thyroid Function Tests Recent Labs    01/16/2018 2008  TSH 0.814    Other results:   Imaging    Dg Chest Port 1 View  Result Date: 01/07/2018 CLINICAL DATA:  Status post right-sided PICC line placement. EXAM: PORTABLE CHEST 1 VIEW COMPARISON:  Radiograph of same day. FINDINGS: Stable cardiomediastinal silhouette. Left-sided pacemaker is unchanged in position. Interval placement of right-sided PICC line with distal tip in expected position of the SVC. No pneumothorax is noted. Stable bilateral interstitial densities are noted concerning for edema or inflammation, left greater than right. Bony thorax is unremarkable. IMPRESSION: Interval placement of right-sided PICC line with distal tip in  expected position of the SVC. Stable bilateral interstitial densities are noted concerning for edema or inflammation. Electronically Signed   By: Marijo Conception, M.D.   On: 01/07/2018 13:39   Dg Chest Port 1 View  Result Date: 01/07/2018 CLINICAL DATA:  Shortness of breath, hemoptysis EXAM: PORTABLE CHEST 1 VIEW COMPARISON:  Chest x-ray of 12/28/2017 and 12/25/2017 FINDINGS: There has been further worsening of interstitial markings throughout the lungs most consistent with interstitial edema. Pneumonia as viral pneumonia cannot be excluded. No definite pleural effusion is seen. Mild cardiomegaly is stable and permanent pacemaker remains. No acute bony abnormality is seen. IMPRESSION: 1. Probable interstitial edema. Cannot exclude pneumonia such as viral pneumonia. 2. Stable cardiomegaly with pacer. Electronically Signed   By: Ivar Drape M.D.   On: 01/07/2018 12:26   [place] Ekg Site Rite  Result Date: 01/07/2018 If Site Rite image not attached, placement could not be confirmed due to current cardiac rhythm.     Medications:     Scheduled Medications: . aspirin EC  81 mg Oral Q breakfast  . atorvastatin  40 mg Oral QHS  . clopidogrel  75 mg Oral Q breakfast  . feeding supplement (ENSURE ENLIVE)  237 mL Oral BID BM  . furosemide  40 mg Oral Daily  . insulin aspart  0-15 Units Subcutaneous TID WC  . ipratropium-albuterol  3 mL Nebulization QID  . pantoprazole  40 mg Oral Daily  . ranolazine  500 mg Oral BID  . sodium chloride flush  10-40 mL Intracatheter Q12H  . sodium chloride flush  3 mL Intravenous Q12H     Infusions: . sodium chloride    . milrinone 0.375 mcg/kg/min (01/08/18 0531)     PRN Medications:  sodium chloride, acetaminophen, famotidine, hydroxypropyl methylcellulose / hypromellose, ipratropium-albuterol, ondansetron (ZOFRAN) IV, polyethylene glycol, sodium chloride flush, sodium chloride flush    Assessment/Plan   1. Acute on chronic systolic CHF: Patient was  admitted with low output/cardiogenic shock with cool extremities and co-ox 49% on milrinone 0.25.  Echo in 12/18 with EF 30-35%, ischemic cardiomyopathy.  Milrinone was increased to 0.375 with improvement in co-ox to 60% this morning.  Patient feels much better, warmer and no nausea.  CVP 8 after diuresis on Lasix gtt overnight.  - Stop Lasix gtt, for now will start Lasix 40 mg po daily (he was not on Lasix at home).  - Continue milrinone 0.375 mcg/kg/min.  - Needs repeat echo to reassess EF and to assess wall motion.   - With age, not a candidate for advanced therapies.  We discussed the possibility that he may be milrinone-dependent and how milrinone can help symptoms but does not prolong life.  It is possible that  LAD intervention could improve cardiac function but if LAD territory is completely scarred it would not be helpful.  Unfortunately, cannot do MRI viability study.  Will see how much anterior wall motion he has on echo.  2. CAD: 12/18 he had PCI to ramus.  Since 2008, he has had occluded LAD.  He has had chronic angina that worsens at times, so suspect he must have some viability in the LAD territory. Suspect mildly elevated troponin with no trend is due to demand ischemia.  - Continue ASA, Plavix, statin, ranolazine 500 mg bid.  - Has PPM so cannot get MRI viability study.  - He is currently stable on IV milrinone gtt.  Hard to tell at this point how much improvement in function he would get with opening CTO LAD.  However, he does have angina so suspect there is some viability.  Next CTO date, however, is 01/28/18.  3. Hemoptysis: ?Related to pulmonary edema.  Resolved.  4. AKI: Suspect this is cardiorenal in setting of low output HF.  Stable creatinine today.  5. Sick sinus syndrome: He has St Jude PPM and is RV pacing.  Ideally, would upgrade to CRT. Will discuss with EP.   Length of Stay: 1  Loralie Champagne, MD  01/08/2018, 7:47 AM  Advanced Heart Failure Team Pager [phone] (M-F; 7a -  4p)  Please contact [facility] for night-coverage after hours (4p -7a ) and weekends on [email or web address]  Loralie Champagne 01/08/2018 8:04 AM

## 2018-01-09 ENCOUNTER — Inpatient Hospital Stay (HOSPITAL_COMMUNITY): Payer: Medicare Other

## 2018-01-09 ENCOUNTER — Other Ambulatory Visit (HOSPITAL_COMMUNITY): Payer: Medicare Other

## 2018-01-09 DIAGNOSIS — I340 Nonrheumatic mitral (valve) insufficiency: Secondary | ICD-10-CM

## 2018-01-09 LAB — CBC
HCT: 35.7 % — ABNORMAL LOW (ref 39.0–52.0)
HEMOGLOBIN: 12.4 g/dL — AB (ref 13.0–17.0)
MCH: 29.1 pg (ref 26.0–34.0)
MCHC: 34.7 g/dL (ref 30.0–36.0)
MCV: 83.8 fL (ref 78.0–100.0)
Platelets: 259 10*3/uL (ref 150–400)
RBC: 4.26 MIL/uL (ref 4.22–5.81)
RDW: 15.7 % — ABNORMAL HIGH (ref 11.5–15.5)
WBC: 12 10*3/uL — AB (ref 4.0–10.5)

## 2018-01-09 LAB — BASIC METABOLIC PANEL
ANION GAP: 12 (ref 5–15)
BUN: 40 mg/dL — ABNORMAL HIGH (ref 6–20)
CHLORIDE: 98 mmol/L — AB (ref 101–111)
CO2: 22 mmol/L (ref 22–32)
Calcium: 8.8 mg/dL — ABNORMAL LOW (ref 8.9–10.3)
Creatinine, Ser: 1.81 mg/dL — ABNORMAL HIGH (ref 0.61–1.24)
GFR calc non Af Amer: 32 mL/min — ABNORMAL LOW (ref >60)
GFR, EST AFRICAN AMERICAN: 37 mL/min — AB (ref >60)
Glucose, Bld: 174 mg/dL — ABNORMAL HIGH (ref 65–99)
POTASSIUM: 3.7 mmol/L (ref 3.5–5.1)
Sodium: 132 mmol/L — ABNORMAL LOW (ref 135–145)

## 2018-01-09 LAB — COOXEMETRY PANEL
CARBOXYHEMOGLOBIN: 1 % (ref 0.5–1.5)
Carboxyhemoglobin: 1.1 % (ref 0.5–1.5)
Carboxyhemoglobin: 1.5 % (ref 0.5–1.5)
METHEMOGLOBIN: 1.4 % (ref 0.0–1.5)
Methemoglobin: 1.3 % (ref 0.0–1.5)
Methemoglobin: 0.9 % (ref 0.0–1.5)
O2 SAT: 52.8 %
O2 SAT: 47.6 %
O2 Saturation: 48.3 %
TOTAL HEMOGLOBIN: 10.8 g/dL — AB (ref 12.0–16.0)
Total hemoglobin: 12.7 g/dL (ref 12.0–16.0)
Total hemoglobin: 12.3 g/dL (ref 12.0–16.0)

## 2018-01-09 LAB — GLUCOSE, CAPILLARY
GLUCOSE-CAPILLARY: 205 mg/dL — AB (ref 65–99)
Glucose-Capillary: 218 mg/dL — ABNORMAL HIGH (ref 65–99)
Glucose-Capillary: 261 mg/dL — ABNORMAL HIGH (ref 65–99)
Glucose-Capillary: 197 mg/dL — ABNORMAL HIGH (ref 65–99)

## 2018-01-09 LAB — LACTIC ACID, PLASMA: LACTIC ACID, VENOUS: 2 mmol/L — AB (ref 0.5–1.9)

## 2018-01-09 MED ORDER — DOCUSATE SODIUM 100 MG PO CAPS
100.0000 mg | ORAL_CAPSULE | Freq: Two times a day (BID) | ORAL | Status: DC
  Administered 2018-01-09 – 2018-01-10 (×3): 100 mg via ORAL
  Filled 2018-01-09 (×3): qty 1

## 2018-01-09 MED ORDER — POLYETHYLENE GLYCOL 3350 17 G PO PACK
17.0000 g | PACK | Freq: Once | ORAL | Status: AC
  Administered 2018-01-09: 17 g via ORAL
  Filled 2018-01-09: qty 1

## 2018-01-09 MED ORDER — INSULIN ASPART 100 UNIT/ML ~~LOC~~ SOLN
0.0000 [IU] | Freq: Three times a day (TID) | SUBCUTANEOUS | Status: DC
  Administered 2018-01-09 – 2018-01-10 (×3): 8 [IU] via SUBCUTANEOUS

## 2018-01-09 MED ORDER — NOREPINEPHRINE BITARTRATE 1 MG/ML IV SOLN
0.0000 ug/min | INTRAVENOUS | Status: DC
  Administered 2018-01-09: 7 ug/min via INTRAVENOUS
  Administered 2018-01-09: 2 ug/min via INTRAVENOUS
  Administered 2018-01-10: 8 ug/min via INTRAVENOUS
  Administered 2018-01-10: 9 ug/min via INTRAVENOUS
  Filled 2018-01-09 (×5): qty 4

## 2018-01-09 MED ORDER — PERFLUTREN LIPID MICROSPHERE
1.0000 mL | INTRAVENOUS | Status: AC | PRN
  Administered 2018-01-09: 4 mL via INTRAVENOUS
  Filled 2018-01-09: qty 10

## 2018-01-09 MED ORDER — INSULIN ASPART 100 UNIT/ML ~~LOC~~ SOLN: 0.0000 [IU] | Freq: Every day | SUBCUTANEOUS | Status: DC

## 2018-01-09 NOTE — Progress Notes (Signed)
Cardiology Moonlighter Note  Called by care nurse regarding patient's persistent hypotension. BP's (checked by cuff) in the 80's to 90's over 50's to 60's consistently. Patient requiring norepinephrine to maintain adequate pressure. Also continues on milrinone. Subjectively feels tired, otherwise no acute complaints. Urine output since 7AM only 350cc.   Norepinephrine titration orders changed to target MAP > 65 and SBP > 80. Checking lactate and another coox (drawn from PICC line). Per review of patient record, it appears as though more invasive hemodynamic monitoring (I.e. PA catheter, arterial line) would likely not be within patient's goals of care. Will continue with aggressive medical therapy overnight and will reach out to patient's family and Dr. Aundra Dubin if the patient's clinical condition continues to deteriorate.   Marcie Mowers, MD Cardiology Fellow, PGY-5

## 2018-01-09 NOTE — Consult Note (Signed)
  Palliative Care Consult Note  Chart reviewed and discussed with Dr. Marigene Ehlers.  I met today with Mr. Bettendorf and 3 of his children in person and a 4th on the phone.  I introduced palliative care as specialized medical care for people living with serious illness. It focuses on providing relief from the symptoms and stress of a serious illness. The goal is to improve quality of life for both the patient and the family.  We discussed clinical course as well as wishes moving forward in regard to advanced directives.  Concepts specific to code status and care plan this hospitalization discussed.  We discussed difference between a aggressive medical intervention path and a palliative, comfort focused care path.    Reviewed pathways forward including pursuing CTO , trying to get home with milrinone therapy, and shifting to focus on comfort.  He and his children understand how serious his illness is, but they report needing to get results of echocardiogram and have further discussion regarding risk vs benefits of CTO before making further decisions.  We discussed that in light of multiple chronic medical problems that have worsened with this acute problem, care should be focused on interventions that are likely to allow the patient to achieve goal of getting back to home and spending time with family. I discussed with family regarding heroic interventions at the end-of-life and they agree this would not be in line with prior expressed wishes for a natural death or be likely to lead to getting well enough to go back home. They were in agreement with changing CODE STATUS to DO NOT RESUSCITATE.  Questions and concerns addressed.   PMT will continue to support holistically.  Message left to notify on call provider of code status change through answering service.  Total time: 80 minutes Greater than 50%  of this time was spent counseling and coordinating care related to the above assessment and  plan.  Micheline Rough, MD [place] Team [phone]

## 2018-01-09 NOTE — Progress Notes (Signed)
Patient ID: Randall Callahan, male   DOB: Feb 05, 1928, 84 y.o.   MRN: [id number]     Advanced Heart Failure Rounding Note  PCP-Cardiologist: Kirk Ruths, MD  [facility]  Subjective:    Patient empirically started on milrinone due to concern for low output on 3/14.  Milrinone increased to 0.375, co-ox down to 48% today.  Weight down with diuresis, CVP 9. Creatinine mildly higher at 1.8.     Still weak, but overall feels better on milrinone.  Has abdominal discomfort, no BM.  SBP 80s-90s.    Objective:   Weight Range: 189 lb 13.1 oz (86.1 kg) Body mass index is 25.74 kg/m.   Vital Signs:   Temp:  [97.5 F (36.4 C)-98.8 F (37.1 C)] 98.8 F (37.1 C) (03/16 0300) Pulse Rate:  [77-119] 114 (03/16 0700) Resp:  [21-31] 26 (03/16 0700) BP: (71-113)/(40-84) 93/64 (03/16 0700) SpO2:  [83 %-97 %] 95 % (03/16 0700) Weight:  [189 lb 13.1 oz (86.1 kg)] 189 lb 13.1 oz (86.1 kg) (03/16 0600) Last BM Date: 01/04/18  Weight change: Filed Weights   01/07/18 1614 01/08/18 0500 01/09/18 0600  Weight: 195 lb 8.8 oz (88.7 kg) 191 lb 5.8 oz (86.8 kg) 189 lb 13.1 oz (86.1 kg)    Intake/Output:   Intake/Output Summary (Last 24 hours) at 01/09/2018 0730 Last data filed at 01/09/2018 0700 Gross per 24 hour  Intake 485 ml  Output 1275 ml  Net -790 ml      Physical Exam    General: NAD Neck: JVP 8 cm, no thyromegaly or thyroid nodule.  Lungs: Clear to auscultation bilaterally with normal respiratory effort. CV: Lateral PMI.  Heart tachy, regular S1/S2, no S3/S4, no murmur.  No peripheral edema.   Abdomen: Soft, nontender, no hepatosplenomegaly, mild distention.  Skin: Intact without lesions or rashes.  Neurologic: Alert and oriented x 3.  Psych: Normal affect. Extremities: No clubbing or cyanosis.  HEENT: Normal.   Telemetry   Sinus tachy with v-pacing (personally reviewed)   Labs    CBC Recent Labs    01/17/2018 1145  01/07/18 0425 01/08/18 0533  WBC 8.4   < > 13.2* 12.1*   NEUTROABS 6.1  --   --   --   HGB 13.6   < > 14.5 12.2*  HCT 40.4   < > 43.8 34.9*  MCV 86.1   < > 88.1 83.9  PLT 276   < > 276 223   < > = values in this interval not displayed.   Basic Metabolic Panel Recent Labs    01/08/18 0533 01/09/18 0431  NA 130* 132*  K 3.6 3.7  CL 97* 98*  CO2 21* 22  GLUCOSE 278* 174*  BUN 39* 40*  CREATININE 1.75* 1.81*  CALCIUM 8.7* 8.8*   Liver Function Tests No results for input(s): AST, ALT, ALKPHOS, BILITOT, PROT, ALBUMIN in the last 72 hours. No results for input(s): LIPASE, AMYLASE in the last 72 hours. Cardiac Enzymes Recent Labs    12/28/2017 2008 01/07/18 0425 01/07/18 0739  TROPONINI 0.14* 0.17* 0.11*    BNP: BNP (last 3 results) Recent Labs    09/29/17 0109 12/25/17 0755 01/20/2018 2008  BNP 205.7* 421.3* 615.2*    ProBNP (last 3 results) No results for input(s): PROBNP in the last 8760 hours.   D-Dimer No results for input(s): DDIMER in the last 72 hours. Hemoglobin A1C Recent Labs    01/07/18 0740  HGBA1C 6.3*   Fasting Lipid Panel No results for  input(s): CHOL, HDL, LDLCALC, TRIG, CHOLHDL, LDLDIRECT in the last 72 hours. Thyroid Function Tests Recent Labs    01/17/2018 2008  TSH 0.814    Other results:   Imaging    No results found.   Medications:     Scheduled Medications: . aspirin EC  81 mg Oral Q breakfast  . atorvastatin  40 mg Oral QHS  . clopidogrel  75 mg Oral Q breakfast  . feeding supplement (ENSURE ENLIVE)  237 mL Oral BID BM  . furosemide  40 mg Oral Daily  . insulin aspart  0-15 Units Subcutaneous TID WC  . ipratropium-albuterol  3 mL Nebulization TID  . pantoprazole  40 mg Oral Daily  . ranolazine  500 mg Oral BID  . sodium chloride flush  10-40 mL Intracatheter Q12H  . sodium chloride flush  3 mL Intravenous Q12H    Infusions: . sodium chloride    . milrinone 0.375 mcg/kg/min (01/09/18 0700)  . norepinephrine (LEVOPHED) Adult infusion      PRN Medications: sodium  chloride, acetaminophen, famotidine, hydroxypropyl methylcellulose / hypromellose, ipratropium-albuterol, ondansetron (ZOFRAN) IV, polyethylene glycol, sodium chloride flush, sodium chloride flush    Assessment/Plan   1. Acute on chronic systolic CHF: Patient was admitted with low output/cardiogenic shock with cool extremities and co-ox 49% on milrinone 0.25.  Echo in 12/18 with EF 30-35%, ischemic cardiomyopathy.  Milrinone was increased to 0.375 with improvement in co-ox to 60% yesterday but back down to 48% this morning. Patient feels better overall but still very weak.  CVP 9 today. SBP 80s-90s.  Suspect end-stage ischemic cardiomyopathy.  - Continue Lasix 40 mg po daily.   - Continue milrinone 0.375 mcg/kg/min. Will add norepinephrine 3 with soft BP and low co-ox.  Repeat co-ox later today.  - Needs repeat echo to reassess EF and to assess wall motion.   - With age, not a candidate for advanced therapies.  We have discussed the possibility that he may be milrinone-dependent and how milrinone can help symptoms but does not prolong life.  It is possible that LAD intervention could improve cardiac function but if LAD territory is completely scarred it would not be helpful.  Unfortunately, cannot do MRI viability study.  Will see how much anterior wall motion he has on echo. At this point, I am not convinced he will get a lot of improvement from CTO procedure on LAD (has been closed since 2008).  Hospice/palliative care would certainly be an option and family is open to those discussions.  2. CAD: 12/18 he had PCI to ramus.  Since 2008, he has had occluded LAD.  He has had chronic angina that worsens at times, so suspect he may have some viability in the LAD territory. Suspect mildly elevated troponin with no trend is due to demand ischemia.  - Continue ASA, Plavix, statin, ranolazine 500 mg bid.  - Has PPM so cannot get MRI viability study.  - He is currently stable on IV milrinone gtt.  Hard to tell  at this point how much improvement in function he would get with opening CTO LAD.  However, he does have angina so suspect there is some viability.  Next CTO date, however, is 01/28/18. If we go this route, would need to do sooner.  3. Hemoptysis: ?Related to pulmonary edema.  Resolved.  4. AKI: Suspect this is cardiorenal in setting of low output HF.  Stable creatinine today.  5. Heart block: He has St Jude PPM and is RV pacing.  Ideally, would upgrade to CRT but in current situation not stable for this.  EP has seen, awaiting consult note.  Currently sinus tachy with RV pacing.   Discussed the above with family.   Await palliative care evaluation.   Needs to get out of bed to chair.   CRITICAL CARE Performed by: Loralie Champagne  Total critical care time: 35 minutes  Critical care time was exclusive of separately billable procedures and treating other patients.  Critical care was necessary to treat or prevent imminent or life-threatening deterioration.  Critical care was time spent personally by me on the following activities: development of treatment plan with patient and/or surrogate as well as nursing, discussions with consultants, evaluation of patient's response to treatment, examination of patient, obtaining history from patient or surrogate, ordering and performing treatments and interventions, ordering and review of laboratory studies, ordering and review of radiographic studies, pulse oximetry and re-evaluation of patient's condition.  Length of Stay: 2  Loralie Champagne, MD  01/09/2018, 7:30 AM  Advanced Heart Failure Team Pager [phone] (M-F; 7a - 4p)  Please contact [facility] for night-coverage after hours (4p -7a ) and weekends on [email or web address]  Loralie Champagne 01/09/2018 7:30 AM

## 2018-01-09 NOTE — Progress Notes (Signed)
Echocardiogram 2D Echocardiogram has been performed.  Randall Callahan 01/09/2018, 3:08 PM

## 2018-01-10 DIAGNOSIS — I25119 Atherosclerotic heart disease of native coronary artery with unspecified angina pectoris: Secondary | ICD-10-CM

## 2018-01-10 DIAGNOSIS — R0602 Shortness of breath: Secondary | ICD-10-CM

## 2018-01-10 DIAGNOSIS — I959 Hypotension, unspecified: Secondary | ICD-10-CM

## 2018-01-10 DIAGNOSIS — Z7189 Other specified counseling: Secondary | ICD-10-CM

## 2018-01-10 DIAGNOSIS — F411 Generalized anxiety disorder: Secondary | ICD-10-CM

## 2018-01-10 DIAGNOSIS — Z515 Encounter for palliative care: Secondary | ICD-10-CM

## 2018-01-10 LAB — ECHOCARDIOGRAM COMPLETE
HEIGHTINCHES: 72 in
WEIGHTICAEL: 3037.06 [oz_av]

## 2018-01-10 LAB — BASIC METABOLIC PANEL
ANION GAP: 12 (ref 5–15)
BUN: 43 mg/dL — ABNORMAL HIGH (ref 6–20)
CALCIUM: 8.9 mg/dL (ref 8.9–10.3)
CHLORIDE: 95 mmol/L — AB (ref 101–111)
CO2: 21 mmol/L — AB (ref 22–32)
Creatinine, Ser: 1.73 mg/dL — ABNORMAL HIGH (ref 0.61–1.24)
GFR calc non Af Amer: 33 mL/min — ABNORMAL LOW (ref >60)
GFR, EST AFRICAN AMERICAN: 39 mL/min — AB (ref >60)
Glucose, Bld: 300 mg/dL — ABNORMAL HIGH (ref 65–99)
POTASSIUM: 3.7 mmol/L (ref 3.5–5.1)
Sodium: 128 mmol/L — ABNORMAL LOW (ref 135–145)

## 2018-01-10 LAB — CBC
HEMATOCRIT: 35 % — AB (ref 39.0–52.0)
HEMOGLOBIN: 12.1 g/dL — AB (ref 13.0–17.0)
MCH: 28.7 pg (ref 26.0–34.0)
MCHC: 34.6 g/dL (ref 30.0–36.0)
MCV: 82.9 fL (ref 78.0–100.0)
Platelets: 266 10*3/uL (ref 150–400)
RBC: 4.22 MIL/uL (ref 4.22–5.81)
RDW: 15.5 % (ref 11.5–15.5)
WBC: 14.8 10*3/uL — ABNORMAL HIGH (ref 4.0–10.5)

## 2018-01-10 LAB — GLUCOSE, CAPILLARY
GLUCOSE-CAPILLARY: 274 mg/dL — AB (ref 65–99)
Glucose-Capillary: 266 mg/dL — ABNORMAL HIGH (ref 65–99)

## 2018-01-10 LAB — COOXEMETRY PANEL
Carboxyhemoglobin: 1.4 % (ref 0.5–1.5)
METHEMOGLOBIN: 0.9 % (ref 0.0–1.5)
O2 Saturation: 48.5 %
TOTAL HEMOGLOBIN: 12 g/dL (ref 12.0–16.0)

## 2018-01-10 MED ORDER — SODIUM CHLORIDE 0.9 % IV SOLN
0.5000 mg/h | INTRAVENOUS | Status: DC
  Administered 2018-01-10: 1 mg/h via INTRAVENOUS
  Filled 2018-01-10: qty 10

## 2018-01-10 MED ORDER — PHENAZOPYRIDINE HCL 100 MG PO TABS
100.0000 mg | ORAL_TABLET | Freq: Three times a day (TID) | ORAL | Status: DC | PRN
  Filled 2018-01-10: qty 1

## 2018-01-10 MED ORDER — GLYCOPYRROLATE 0.2 MG/ML IJ SOLN
0.2000 mg | INTRAMUSCULAR | Status: DC | PRN
  Filled 2018-01-10: qty 1

## 2018-01-10 MED ORDER — MORPHINE BOLUS VIA INFUSION
2.0000 mg | INTRAVENOUS | Status: DC | PRN
  Administered 2018-01-10 (×2): 4 mg via INTRAVENOUS
  Administered 2018-01-11: 3 mg via INTRAVENOUS
  Administered 2018-01-11 (×2): 2 mg via INTRAVENOUS
  Filled 2018-01-10: qty 4

## 2018-01-10 MED ORDER — LORAZEPAM 2 MG/ML IJ SOLN
0.5000 mg | Freq: Four times a day (QID) | INTRAMUSCULAR | Status: DC
  Administered 2018-01-11 (×2): 0.5 mg via INTRAVENOUS
  Filled 2018-01-10 (×2): qty 1

## 2018-01-10 MED ORDER — INSULIN ASPART 100 UNIT/ML ~~LOC~~ SOLN: 0.0000 [IU] | Freq: Every evening | SUBCUTANEOUS | Status: DC | PRN

## 2018-01-10 MED ORDER — LORAZEPAM 1 MG PO TABS
1.0000 mg | ORAL_TABLET | Freq: Four times a day (QID) | ORAL | Status: DC | PRN
  Administered 2018-01-10 (×2): 1 mg via ORAL
  Filled 2018-01-10 (×3): qty 1

## 2018-01-10 MED ORDER — LORAZEPAM 2 MG/ML IJ SOLN
1.0000 mg | INTRAMUSCULAR | Status: DC | PRN
Start: 2018-01-10 — End: 2018-01-11
  Administered 2018-01-10: 1 mg via INTRAVENOUS
  Filled 2018-01-10: qty 1

## 2018-01-10 MED ORDER — MILRINONE LACTATE IN DEXTROSE 20-5 MG/100ML-% IV SOLN: 0.1250 ug/kg/min | INTRAVENOUS | Status: DC

## 2018-01-10 MED ORDER — MORPHINE 100MG IN NS 100ML (1MG/ML) PREMIX INFUSION
0.5000 mg/h | INTRAVENOUS | Status: DC
  Filled 2018-01-10: qty 100

## 2018-01-10 MED ORDER — INSULIN DETEMIR 100 UNIT/ML FLEXPEN
8.0000 [IU] | Freq: Every morning | SUBCUTANEOUS | Status: DC
  Filled 2018-01-10: qty 3

## 2018-01-10 MED ORDER — GLYCOPYRROLATE 0.2 MG/ML IJ SOLN: 0.2000 mg | INTRAMUSCULAR | Status: DC

## 2018-01-10 MED ORDER — INSULIN DETEMIR 100 UNIT/ML ~~LOC~~ SOLN
8.0000 [IU] | Freq: Every day | SUBCUTANEOUS | Status: DC
  Filled 2018-01-10: qty 0.08

## 2018-01-10 MED ORDER — MIDAZOLAM HCL 2 MG/2ML IJ SOLN
1.0000 mg | INTRAMUSCULAR | Status: DC | PRN
  Administered 2018-01-11: 1 mg via INTRAVENOUS
  Filled 2018-01-10: qty 2

## 2018-01-10 MED ORDER — FUROSEMIDE 10 MG/ML IJ SOLN
80.0000 mg | Freq: Two times a day (BID) | INTRAMUSCULAR | Status: DC
  Administered 2018-01-10 (×2): 80 mg via INTRAVENOUS
  Filled 2018-01-10 (×2): qty 8

## 2018-01-10 MED ORDER — MORPHINE 100MG IN NS 100ML (1MG/ML) PREMIX INFUSION: 0.5000 mg/h | INTRAVENOUS | Status: DC

## 2018-01-10 MED ORDER — POTASSIUM CHLORIDE 10 MEQ/50ML IV SOLN
10.0000 meq | INTRAVENOUS | Status: AC
  Administered 2018-01-10 (×2): 10 meq via INTRAVENOUS
  Filled 2018-01-10 (×2): qty 50

## 2018-01-10 NOTE — Progress Notes (Signed)
PT Cancellation Note  Patient Details Name: Randall Callahan MRN: [id number] DOB: [birth year]   Cancelled Treatment:    Reason Eval/Treat Not Completed: PT screened, no needs identified, will sign off Per RN, pt coming to end of life and most likely going on hospice. Will sign off at this time. Please re-consult if anything changes.    Marguarite Arbour A Tana Trefry 01/10/2018, 12:57 PM Wray Kearns, [place], DPT [phone]

## 2018-01-10 NOTE — Progress Notes (Signed)
Daily Progress Note   Patient Name: Randall Callahan       Date: 01/10/2018 DOB: 08-28-1928  Age: 84 y.o. MRN#: [id number] Attending Physician: Larey Dresser, MD Primary Care Physician: Dineen Kid, MD Admit Date: 01/17/2018  Reason for Consultation/Follow-up: Establishing goals of care and Terminal Care  Subjective: Patient appears slightly short of breath at rest but tells me he is not hurting and not short of breath. Ate a few bites of lunch. Tells me he wants to rest.   GOC:  F/u GOC with son Alroy Dust) and DIL [name]) in family conference room. Sister and other HCPOA Mollie Germany) on speaker phone. Recalled conversation with Dr. Domingo Cocking with palliative yesterday (multiple family members present) and also Nicole's conversation with Dr. Aundra Dubin this morning. Alroy Dust and [name] have a good understanding of diagnoses, that he is not a candidate for invasive cardiac procedures with age and acuity of hospitalization. [name] tells me the levo and milrinone are "what's keeping him alive." They have a good understanding of poor prognosis once these infusions are discontinued. Alroy Dust and [name] tell me they have decided as a family that they want to focus on his comfort. "We just want him comfortable."   [name] feels Mr. Slauson has a good understanding of diagnoses and prognosis after talking with Dr. Aundra Dubin this morning. [name] speaks of him making comments about being "ready."   Educated on transition to comfort focused care with goals of comfort, quality, and dignity at EOL. Educated on medication regimen to ensure relief from pain, anxiety, dyspnea, secretions, nausea--any suffering. [name] and Mitchell's biggest fear is him being "afraid" like he was the other night in ED. They do not want him to  suffer. They understand he could decline quickly after infusions discontinued or may surprise [place] and survive a few days. I ensured focus on comfort regardless. Mollie Germany also confirms focus on comfort via telephone.   Therapeutic listening and emotional support provided as [name] and Alroy Dust share stories of the patient.   After discussion with family, Fransisca Connors and I went to bedside to assess the patient. He is awake and remains slightly SOB. RN at bedside giving prn ativan. I explained to the patient that knowing we cannot fix his heart, shift to interventions that focus on comfort including scheduled medications for dyspnea and anxiety. Patient is agreeable.  I explained to patient and family that he should eat what brings him joy.   Answered questions and concerns.   Length of Stay: 3  Current Medications: Scheduled Meds:  . aspirin EC  81 mg Oral Q breakfast  . atorvastatin  40 mg Oral QHS  . clopidogrel  75 mg Oral Q breakfast  . docusate sodium  100 mg Oral BID  . feeding supplement (ENSURE ENLIVE)  237 mL Oral BID BM  . furosemide  80 mg Intravenous BID  . insulin aspart  0-15 Units Subcutaneous TID WC  . insulin aspart  0-5 Units Subcutaneous QHS  . insulin detemir  8 Units Subcutaneous q morning - 10a  . ipratropium-albuterol  3 mL Nebulization TID  . pantoprazole  40 mg Oral Daily  . ranolazine  500 mg Oral BID  . sodium chloride flush  10-40 mL Intracatheter Q12H  . sodium chloride flush  3 mL Intravenous Q12H    Continuous Infusions: . sodium chloride 250 mL (01/10/18 0800)  . milrinone 0.375 mcg/kg/min (01/10/18 0915)  . norepinephrine (LEVOPHED) Adult infusion 8 mcg/min (01/10/18 0800)    PRN Meds: sodium chloride, acetaminophen, famotidine, hydroxypropyl methylcellulose / hypromellose, insulin aspart, ipratropium-albuterol, LORazepam, ondansetron (ZOFRAN) IV, polyethylene glycol, sodium chloride flush, sodium chloride flush  Physical Exam  Constitutional: He  is easily aroused. He appears ill.  HENT:  Head: Normocephalic and atraumatic.  Pulmonary/Chest:  Dyspnea at rest  Neurological: He is alert and easily aroused.  Skin: Skin is warm and dry.  Psychiatric: His speech is normal and behavior is normal. His mood appears anxious.  Nursing note and vitals reviewed.          Vital Signs: BP 105/66   Pulse 90   Temp 97.9 F (36.6 C) (Oral)   Resp (!) 27   Ht 6' (1.829 m)   Wt 89.2 kg (196 lb 10.4 oz)   SpO2 91%   BMI 26.67 kg/m  SpO2: SpO2: 91 % O2 Device: O2 Device: Nasal Cannula O2 Flow Rate: O2 Flow Rate (L/min): 3 L/min  Intake/output summary:   Intake/Output Summary (Last 24 hours) at 01/10/2018 1412 Last data filed at 01/10/2018 0800 Gross per 24 hour  Intake 1171.02 ml  Output 300 ml  Net 871.02 ml   LBM: Last BM Date: 01/09/18 Baseline Weight: Weight: 86.2 kg (190 lb) Most recent weight: Weight: 89.2 kg (196 lb 10.4 oz)       Palliative Assessment/Data: PPS 30%     Patient Active Problem List   Diagnosis Date Noted  . Complete heart block ([place])   . Cardiogenic shock ([place])   . AKI (acute kidney injury) ([place])   . Hypotension 12/29/2017  . Unstable angina ([place]) 12/25/2017  . Squamous cell carcinoma in situ of skin   . Schatzki's ring   . Pneumonia   . Peripheral neuropathy   . MCI (mild cognitive impairment)   . Hypertension   . Hx of sick sinus syndrome   . High cholesterol   . Diverticulosis   . GERD (gastroesophageal reflux disease)   . CAD S/P percutaneous coronary angioplasty   . Complication of anesthesia   . CKD (chronic kidney disease), stage III ([place])   . Chronic lower back pain   . Chest tightness   . Chest pain   . Bradycardia   . Cancer of skin, face   . ASCVD (arteriosclerotic cardiovascular disease)   . Arthritis   . Angiomyolipoma of kidney   . Angina pectoris, unstable (  HCC)   . Dyspnea   . CHF (congestive heart failure) ([place]) 09/29/2017  . Hyperkalemia 09/29/2017  . Hyponatremia  09/29/2017  . Gait abnormality 01/08/2017  . Neck pain 01/08/2017  . Low back pain 01/08/2017  . Ischemic cardiomyopathy 12/17/2016  . Non-insulin treated type 2 diabetes mellitus ([place]) 12/17/2016  . Hyperlipidemia 12/17/2016  . NSTEMI (non-ST elevation myocardial infarction) ([place]) 08/27/2016  . Presence of permanent cardiac pacemaker 10/27/2013  . Cancer [place]) 10/27/2000    Palliative Care Assessment & Plan   Patient Profile: Randall Callahan is an 33SH male with complicated history of CAD s/p stent, NSTEMI, ICM with EF 20-25%, SSS s/p PPM, CKD stage II, and DM 2 admitted with weakness, hypotension, and cardiogenic shock. Patient with acute on chronic systolic CHF. ECHO 3/16 with EF 20-25% and apical/peri-apical akinesis. On levo and milrinone continuous infusions. Receiving Lasix 80mg  BID. Per cardiology, suspect end-stage ischemic cardiomyopathy. Patient is not a candidate for advanced therapies and recommending palliative/hospice interventions. AKI in setting of low ouput HF/cardiorenal syndrome. DNR/DNI. Palliative medicine consultation for goals of care.   Assessment: Acute on chronic systolic CHF Cardiogenic shock Ischemic cardiomyopathy with EF 20-25% CAD SSS s/p pacemaker AKI   Recommendations/Plan:  After further discussion with patient/family, transition to comfort focused care with goal of comfort and dignity at EOL.   Symptom management-discussed with Dr. Micheline Rough.   RN to titrate levo/milrinone infusions off.  Initiate morphine 0.5mg /hr continuous infusion.   RN may bolus via infusion 2-4mg  IV q14min prn pain/dyspnea/air hunger.   Ativan 0.5mg  IV q6h scheduled  Ativan 1mg  IV q4h prn anxiety  Versed 1mg  IV q1h prn anxiety/agitation not relieved by ativan  Robinul 0.2mg  IV q4h scheduled  Robinul 0.2mg  IV q4h prn secretions  Discontinued medications/interventions not aimed at comfort.   Comfort feeds per patient/family request.  PMT will continue to  follow.     Code Status: DNR/DNI   Code Status Orders  (From admission, onward)        Start     Ordered   01/09/18 1211  Do not attempt resuscitation (DNR)  Continuous    Question Answer Comment  In the event of cardiac or respiratory ARREST Do not call a "code blue"   In the event of cardiac or respiratory ARREST Do not perform Intubation, CPR, defibrillation or ACLS   In the event of cardiac or respiratory ARREST Use medication by any route, position, wound care, and other measures to relive pain and suffering. May use oxygen, suction and manual treatment of airway obstruction as needed for comfort.      01/09/18 1210    Code Status History    Date Active Date Inactive Code Status Order ID Comments User Context   01/23/2018 19:59 01/09/2018 12:10 Full Code [id number]  Tommie Raymond, NP ED   12/25/2017 14:43 12/30/2017 16:46 Full Code [id number]  Reola Mosher Inpatient   09/29/2017 05:07 10/03/2017 14:17 Full Code [id number]  Jani Gravel, MD ED       Prognosis:   Unable to determine: poor prognosis with end-stage ischemic cardiomyopathy, systolic heart failure, and cardiogenic shock. Not a candidate for invasive cardiac procedures. Remains hypotensive on levophed infusion. Likely hours-days when levophed and milrinone infusions are discontinued.    Discharge Planning:  To Be Determined  Care plan was discussed with patient, son, daughter, DIL, RN, Dr. Domingo Cocking  Thank you for allowing the Palliative Medicine Team to assist in the care of this patient.   Time In:  1410 Time Out: 1620 Total Time 145min Prolonged Time Billed yes      Greater than 50%  of this time was spent counseling and coordinating care related to the above assessment and plan.  Ihor Dow, FNP-C Palliative Medicine Team  Phone: [phone] Fax: [phone]  Please contact Palliative Medicine Team phone at [phone] for questions and concerns.

## 2018-01-10 NOTE — Progress Notes (Signed)
Patient ID: Randall Callahan, male   DOB: [birth year], 84 y.o.   MRN: [id number]     Advanced Heart Failure Rounding Note  PCP-Cardiologist: Kirk Ruths, MD  [facility]  Subjective:    Patient empirically started on milrinone due to concern for low output on 3/14.  Milrinone increased to 0.375, and norepinephrine added and increased to 8.  Co-ox remains marginal today at 48.5, minimal UOP.  He complains of early satiety and nausea.  SBP 80s-90s.     I reviewed 3/16 echo, this showed EF 20-25% with apical/peri-apical akinesis and moderate MR.    Objective:   Weight Range: 196 lb 10.4 oz (89.2 kg) Body mass index is 26.67 kg/m.   Vital Signs:   Temp:  [98 F (36.7 C)-98.9 F (37.2 C)] 98.4 F (36.9 C) (03/17 0800) Pulse Rate:  [72-127] 101 (03/17 0800) Resp:  [16-34] 28 (03/17 0800) BP: (76-109)/(40-83) 100/53 (03/17 0840) SpO2:  [90 %-99 %] 95 % (03/17 0807) Weight:  [196 lb 10.4 oz (89.2 kg)] 196 lb 10.4 oz (89.2 kg) (03/17 0600) Last BM Date: 01/09/18  Weight change: Filed Weights   01/08/18 0500 01/09/18 0600 01/10/18 0600  Weight: 191 lb 5.8 oz (86.8 kg) 189 lb 13.1 oz (86.1 kg) 196 lb 10.4 oz (89.2 kg)    Intake/Output:   Intake/Output Summary (Last 24 hours) at 01/10/2018 0859 Last data filed at 01/10/2018 0800 Gross per 24 hour  Intake 1894.03 ml  Output 550 ml  Net 1344.03 ml      Physical Exam    General: NAD Neck: JVP 8-9 cm, no thyromegaly or thyroid nodule.  Lungs: Decreased BS at bases bilaterally.  CV: Lateral PMI.  Heart regular S1/S2, no S3/S4, 2/6 HSM apex.  No peripheral edema.   Abdomen: Soft, nontender, no hepatosplenomegaly, no distention.  Skin: Intact without lesions or rashes.  Neurologic: Alert and oriented x 3.  Psych: Normal affect. Extremities: No clubbing or cyanosis.  HEENT: Normal.    Telemetry   Sinus tachy with v-pacing (personally reviewed)   Labs    CBC Recent Labs    01/09/18 0431 01/10/18 0321  WBC 12.0*  14.8*  HGB 12.4* 12.1*  HCT 35.7* 35.0*  MCV 83.8 82.9  PLT 259 518   Basic Metabolic Panel Recent Labs    01/09/18 0431 01/10/18 0321  NA 132* 128*  K 3.7 3.7  CL 98* 95*  CO2 22 21*  GLUCOSE 174* 300*  BUN 40* 43*  CREATININE 1.81* 1.73*  CALCIUM 8.8* 8.9   Liver Function Tests No results for input(s): AST, ALT, ALKPHOS, BILITOT, PROT, ALBUMIN in the last 72 hours. No results for input(s): LIPASE, AMYLASE in the last 72 hours. Cardiac Enzymes No results for input(s): CKTOTAL, CKMB, CKMBINDEX, TROPONINI in the last 72 hours.  BNP: BNP (last 3 results) Recent Labs    09/29/17 0109 12/25/17 0755 01/09/2018 2008  BNP 205.7* 421.3* 615.2*    ProBNP (last 3 results) No results for input(s): PROBNP in the last 8760 hours.   D-Dimer No results for input(s): DDIMER in the last 72 hours. Hemoglobin A1C No results for input(s): HGBA1C in the last 72 hours. Fasting Lipid Panel No results for input(s): CHOL, HDL, LDLCALC, TRIG, CHOLHDL, LDLDIRECT in the last 72 hours. Thyroid Function Tests No results for input(s): TSH, T4TOTAL, T3FREE, THYROIDAB in the last 72 hours.  Invalid input(s): FREET3  Other results:   Imaging    No results found.   Medications:     Scheduled  Medications: . aspirin EC  81 mg Oral Q breakfast  . atorvastatin  40 mg Oral QHS  . clopidogrel  75 mg Oral Q breakfast  . docusate sodium  100 mg Oral BID  . feeding supplement (ENSURE ENLIVE)  237 mL Oral BID BM  . furosemide  80 mg Intravenous BID  . insulin aspart  0-15 Units Subcutaneous TID WC  . insulin aspart  0-5 Units Subcutaneous QHS  . ipratropium-albuterol  3 mL Nebulization TID  . pantoprazole  40 mg Oral Daily  . ranolazine  500 mg Oral BID  . sodium chloride flush  10-40 mL Intracatheter Q12H  . sodium chloride flush  3 mL Intravenous Q12H    Infusions: . sodium chloride 250 mL (01/10/18 0800)  . milrinone 0.375 mcg/kg/min (01/10/18 0800)  . norepinephrine (LEVOPHED)  Adult infusion 8 mcg/min (01/10/18 0800)  . potassium chloride      PRN Medications: sodium chloride, acetaminophen, famotidine, hydroxypropyl methylcellulose / hypromellose, ipratropium-albuterol, LORazepam, ondansetron (ZOFRAN) IV, polyethylene glycol, sodium chloride flush, sodium chloride flush    Assessment/Plan   1. Acute on chronic systolic CHF: Patient was admitted with low output/cardiogenic shock with cool extremities and co-ox 49% on milrinone 0.25.  Echo in 12/18 with EF 30-35%, ischemic cardiomyopathy.  Milrinone was increased to 0.375 and now on norepinephrine 8.  Co-ox still low at 48.5%.  CVP 10 today, SBP 80s-90s.  Echo yesterday was reviewed, EF 20-25% with apical/peri-apical akinesis.  Suspect end-stage ischemic cardiomyopathy. Suspect nausea, early satiety is due to low output HF.   - Increase Lasix to 80 mg IV bid.  - Continue milrinone 0.375 mcg/kg/min and current norepinephrine.  Co-ox remains low.  - With age, not a candidate for advanced therapies.  Unfortunately, cannot do MRI viability study for LAD territory.  The apex/peri-apical walls appear akinetic by echo.  At this point, I do think he will get a lot of improvement from CTO procedure on LAD (has been closed since 2008).  Even if I thought that he had hibernating myocardium, he is not stable for the procedure.  I think that hospice/palliative care is probably our best route here.  2. CAD: 12/18 he had PCI to ramus.  Since 2008, he has had occluded LAD. Suspect mildly elevated troponin with no trend is due to demand ischemia.  - Continue ASA, Plavix, statin, ranolazine 500 mg bid.  - Has PPM so cannot get MRI viability study.  Echo shows akinetic apex/peri-apical area.  I suspect there may not be much LAD territory viability, and he is too unstable at this point for CTO procedure (on milrinone and norepinephrine but still with low heart output as evidenced by co-ox).  As above, recommend hospice/palliative care at this  point.  3. Hemoptysis: ?Related to pulmonary edema.  Resolved.  4. AKI: Suspect this is cardiorenal in setting of low output HF.  Lower creatinine today.   5. Heart block: He has [employer] PPM and is RV pacing.  Ideally, would upgrade to CRT but in current situation not stable for this.Currently sinus tachy with RV pacing.   Discussed the above with family.  They would like to see palliative care service again today to help with decision-making.   CRITICAL CARE Performed by: Loralie Champagne  Total critical care time: 35 minutes  Critical care time was exclusive of separately billable procedures and treating other patients.  Critical care was necessary to treat or prevent imminent or life-threatening deterioration.  Critical care was time spent personally  by me on the following activities: development of treatment plan with patient and/or surrogate as well as nursing, discussions with consultants, evaluation of patient's response to treatment, examination of patient, obtaining history from patient or surrogate, ordering and performing treatments and interventions, ordering and review of laboratory studies, ordering and review of radiographic studies, pulse oximetry and re-evaluation of patient's condition.  Length of Stay: 3  Loralie Champagne, MD  01/10/2018, 8:59 AM  Advanced Heart Failure Team Pager [phone] (M-F; [place])  Please contact [facility] for night-coverage after hours (4p -7a ) and weekends on [email or web address]  Loralie Champagne 01/10/2018 8:59 AM

## 2018-01-11 ENCOUNTER — Encounter (HOSPITAL_COMMUNITY): Payer: Medicare Other

## 2018-01-11 DIAGNOSIS — Z515 Encounter for palliative care: Secondary | ICD-10-CM

## 2018-01-12 ENCOUNTER — Ambulatory Visit (HOSPITAL_COMMUNITY)
Admission: RE | Admit: 2018-01-12 | Payer: Medicare Other | Source: Ambulatory Visit | Admitting: Interventional Cardiology

## 2018-01-12 ENCOUNTER — Encounter (HOSPITAL_COMMUNITY): Admission: RE | Payer: Self-pay | Source: Ambulatory Visit

## 2018-01-12 SURGERY — LEFT HEART CATH AND CORONARY ANGIOGRAPHY
Anesthesia: LOCAL

## 2018-01-13 ENCOUNTER — Encounter (HOSPITAL_COMMUNITY): Payer: Medicare Other

## 2018-01-14 NOTE — Progress Notes (Signed)
Discharge Progress Report  Patient Details  Name: Randall Callahan MRN: [id number] Date of Birth: 11-Dec-1927 Referring Provider:   Flowsheet Row CARDIAC REHAB PHASE II ORIENTATION from 12/15/2017 in [place]  Referring Provider  Kirk Ruths MD       Number of Visits: 3  Reason for Discharge:  Early Exit:  deceased   Smoking History:  Social History   Tobacco Use  Smoking Status Former Smoker  . Years: 2.00  . Types: Cigarettes  Smokeless Tobacco Never Used  Tobacco Comment   STARTED SMOKING IN 4TH GRADE, QUIT IN 6TH    Diagnosis:  NSTEMI (non-ST elevated myocardial infarction) ([place])  Status post coronary artery stent placement  ADL UCSD:   Initial Exercise Prescription: Initial Exercise Prescription - 12/15/17 1000    Date of Initial Exercise RX and Referring Provider          Date  12/15/17    Referring Provider  Kirk Ruths MD        Recumbant Bike          Level  1    Minutes  10    METs  1.5        NuStep          Level  1    SPM  50    Minutes  10    METs  1.5        Arm Ergometer          Level  1    Minutes  10    METs  1.2        Prescription Details          Frequency (times per week)  3    Duration  Progress to 30 minutes of continuous aerobic without signs/symptoms of physical distress        Intensity          THRR 40-80% of Max Heartrate  53-106    Ratings of Perceived Exertion  11-13    Perceived Dyspnea  0-4        Progression          Progression  Continue to progress workloads to maintain intensity without signs/symptoms of physical distress.        Resistance Training          Training Prescription  Yes    Weight  1lb    Reps  10-15           Discharge Exercise Prescription (Final Exercise Prescription Changes):   Functional Capacity: 6 Minute Walk    6 Minute Walk    Row Name 12/15/17 0831   Phase  Initial   Distance  679 feet   Walk Time  6 minutes   #  of Rest Breaks  0   MPH  1.29   METS  0.76   RPE  10   VO2 Peak  2.65   Symptoms  Yes (comment)   Comments  Legs tired after walk test.   Resting HR  68 bpm   Resting BP  114/53   Resting Oxygen Saturation   98 %   Exercise Oxygen Saturation  during 6 min walk  99 %   Max Ex. HR  81 bpm   Max Ex. BP  116/54   2 Minute Post BP  128/78          Psychological, QOL, Others - Outcomes: PHQ 2/9: Depression screen [facility] 2/9 12/21/2017  Decreased Interest  0  Down, Depressed, Hopeless 0  PHQ - 2 Score 0    Quality of Life: Quality of Life - 12/15/17 0925    Quality of Life Scores          Health/Function Pre  18.07 %    Socioeconomic Pre  22 %    Psych/Spiritual Pre  19.08 %    Family Pre  25.5 %    GLOBAL Pre  20.05 %           Personal Goals: Goals established at orientation with interventions provided to work toward goal. Personal Goals and Risk Factors at Admission - 12/15/17 1045    Core Components/Risk Factors/Patient Goals on Admission          Hypertension  Yes pt has h/o HTN; recent BP readings are low (hypotensive)    Intervention  Provide education on lifestyle modifcations including regular physical activity/exercise, weight management, moderate sodium restriction and increased consumption of fresh fruit, vegetables, and low fat dairy, alcohol moderation, and smoking cessation.;Monitor prescription use compliance.    Expected Outcomes  Short Term: Continued assessment and intervention until BP is < 140/41mm HG in hypertensive participants. < 130/86mm HG in hypertensive participants with diabetes, heart failure or chronic kidney disease.;Long Term: Maintenance of blood pressure at goal levels.            Personal Goals Discharge: Goals and Risk Factor Review    Core Components/Risk Factors/Patient Goals Review    Row Name 12/21/17 0912 01/05/18 1608   Personal Goals Review  Diabetes;Hypertension;Lipids  Diabetes;Hypertension;Lipids   Review  pt with multiple  CAD RF demonstrates eagerness to participate in CR group exercise program.  pt with limited lower extremity strength uses rollator at CR for stability and balance.   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    pt currently on medical hold.    Expected Outcomes  pt will participate in CR exercise, nutrition and lifestyle modification education opportunities.   pt will participate in CR exercise, nutrition and lifestyle modification education opportunities.           Exercise Goals and Review: Exercise Goals    Exercise Goals    Row Name 12/15/17 0856 12/15/17 0859   Increase Physical Activity  Yes  no documentation   Intervention  Provide advice, education, support and counseling about physical activity/exercise needs.;Develop an individualized exercise prescription for aerobic and resistive training based on initial evaluation findings, risk stratification, comorbidities and participant's personal goals.  no documentation   Expected Outcomes  Short Term: Attend rehab on a regular basis to increase amount of physical activity.;Long Term: Exercising regularly at least 3-5 days a week.;Long Term: Add in home exercise to make exercise part of routine and to increase amount of physical activity.  no documentation   Increase Strength and Stamina  Yes  no documentation return to playing tennis, gardening home activities   Intervention  Provide advice, education, support and counseling about physical activity/exercise needs.;Develop an individualized exercise prescription for aerobic and resistive training based on initial evaluation findings, risk stratification, comorbidities and participant's personal goals.  no documentation   Expected Outcomes  Short Term: Increase workloads from initial exercise prescription for resistance, speed, and METs.;Long Term: Improve cardiorespiratory fitness, muscular endurance and strength as measured by increased METs and  functional capacity (6MWT);Short Term: Perform resistance training exercises routinely during rehab and add in resistance training at home  no documentation   Able to understand  and use rate of perceived exertion (RPE) scale  Yes  no documentation   Intervention  Provide education and explanation on how to use RPE scale  no documentation   Expected Outcomes  Short Term: Able to use RPE daily in rehab to express subjective intensity level;Long Term:  Able to use RPE to guide intensity level when exercising independently  no documentation   Knowledge and understanding of Target Heart Rate Range (THRR)  Yes  no documentation   Intervention  Provide education and explanation of THRR including how the numbers were predicted and where they are located for reference  no documentation   Expected Outcomes  Short Term: Able to state/look up THRR;Long Term: Able to use THRR to govern intensity when exercising independently;Short Term: Able to use daily as guideline for intensity in rehab  no documentation   Able to check pulse independently  Yes  no documentation   Intervention  Provide education and demonstration on how to check pulse in carotid and radial arteries.;Review the importance of being able to check your own pulse for safety during independent exercise  no documentation   Expected Outcomes  Short Term: Able to explain why pulse checking is important during independent exercise;Long Term: Able to check pulse independently and accurately  no documentation   Understanding of Exercise Prescription  Yes  no documentation   Intervention  Provide education, explanation, and written materials on patient's individual exercise prescription  no documentation   Expected Outcomes  Short Term: Able to explain program exercise prescription;Long Term: Able to explain home exercise prescription to exercise independently  no documentation          Nutrition & Weight - Outcomes: Pre Biometrics - 12/15/17 1042     Pre Biometrics          Height  6' 0.25" (1.835 m)    Weight  197 lb 12 oz (89.7 kg)    Waist Circumference  40.75 inches    Hip Circumference  41.5 inches    Waist to Hip Ratio  0.98 %    BMI (Calculated)  26.64    Triceps Skinfold  12 mm    % Body Fat  26.9 %    Grip Strength  39 kg    Flexibility  13 in    Single Leg Stand  9 seconds            Nutrition: Nutrition Therapy & Goals - 12/16/17 0839    Nutrition Therapy          Diet  Carb Modified, Heart Healthy        Personal Nutrition Goals          Nutrition Goal  Pt to describe the benefit of including fruits, vegetables, whole grains, and low-fat dairy products in a heart healthy meal plan.        Intervention Plan          Intervention  Prescribe, educate and counsel regarding individualized specific dietary modifications aiming towards targeted core components such as weight, hypertension, lipid management, diabetes, heart failure and other comorbidities.    Expected Outcomes  Short Term Goal: Understand basic principles of dietary content, such as calories, fat, sodium, cholesterol and nutrients.;Long Term Goal: Adherence to prescribed nutrition plan.           Nutrition Discharge: Nutrition Assessments - 12/16/17 0839    Rate Your Plate Scores          Pre Score  70  Education Questionnaire Score: Knowledge Questionnaire Score - 12/15/17 0925    Knowledge Questionnaire Score          Pre Score  18/24           Goals reviewed with patient; copy given to patient.

## 2018-01-15 ENCOUNTER — Encounter (HOSPITAL_COMMUNITY): Payer: Medicare Other

## 2018-01-18 ENCOUNTER — Encounter (HOSPITAL_COMMUNITY): Payer: Medicare Other

## 2018-01-18 ENCOUNTER — Telehealth (HOSPITAL_COMMUNITY): Payer: Self-pay | Admitting: *Deleted

## 2018-01-18 NOTE — Telephone Encounter (Signed)
Received death certificate from [place] on 01/15/18.  Completed/signed and faxed to [phone].  Funeral home will pick up original at front desk.  Copy will be scanned to patient's electronic medical record.

## 2018-01-20 ENCOUNTER — Encounter (HOSPITAL_COMMUNITY): Payer: Medicare Other

## 2018-01-22 ENCOUNTER — Encounter (HOSPITAL_COMMUNITY): Payer: Medicare Other

## 2018-01-25 ENCOUNTER — Encounter (HOSPITAL_COMMUNITY): Payer: Medicare Other

## 2018-01-25 NOTE — Discharge Summary (Signed)
  Advanced Heart Failure Death Summary  Death Summary   Patient ID: Randall Callahan MRN: [id number], DOB/AGE: [birth year] 84 y.o. Admit date: 01/02/2018 D/C date:    2018-02-05    Primary Discharge Diagnoses:  1.Cardiogenic Shock/A/C Systolic Heart Failure  2. CAD 3. Hemoptysis 4.  AKI 5 . Heart Block    Hospital Course:  Randall Callahan was an  84 y.o. male with a long,complicated history ofCAD(proximal to mid LADwithtotal occlusion at distal segment of previous stent, proximal circumflex 99%occluded at previously placed stent,RCA with mild disease,ostial to mid ramus 90% stenosedwithDESx1to mid-distal ramus and DESx1to proximal ramus on 10/02/2017),ICM with a EF of 30%, SSSs/pPPM, CKD stageIIand DM 2.  Most recently admitted 12/25/17 for unstable angina where Ranexa was added to his medical regimen. This despite multiple stents as above.  Last seen in [facility] office 01/05/18 with recurrent chest pain.  He had worsening chest pain since his last cath in 09/2017.  Discussed at length, and planned for LHC with CTO to the LAD on 01/12/18.  Admitted with weakness, increased dyspnea, and hypotension.  CXR with volume overload.Pertinent labs on admission include 1.52, K 5.9, Mildly elevated troponins (chronic), BNP 615, WBC 8.0, Hgb 12.7, PCT negative. CXR with at least mild interstital edema.   Hospital course was complicated by low output heart failure, ischemia cardiomyopathy, and cardiorenal syndrome.  PICC line was placed and showed low mixed venous saturations. He required dual pressors and IV lasix. Unfortunately he continued to decline so he was not a candidate for CTO procedure. Palliative Care consulted and he was transitioned to comfort care. He passed quietly with family at his bedside on March 18th.   1. Acute on chronic systolic CHF/ Cardiogenic Shock: Patient was admitted with low output/cardiogenic shock with cool extremities and co-ox 49% on milrinone 0.25. Echo  in 12/18 with EF 30-35%, ischemic cardiomyopathy.  He required dual pressors and how ongoing low mixed venous saturations. Attempted to diurese with IV lasix but had poor response.   With age, not a candidate for advanced therapies.  Unfortunately, cannot do MRI viability study for LAD territory.  The apex/peri-apical walls appear akinetic by echo.  At this point, I do think he will get a lot of improvement from CTO procedure on LAD (has been closed since 2008).   2. CAD: 12/18 he had PCI to ramus. Since 2008, he has had occluded LAD. Suspect mildly elevated troponin with no trend is due to demand ischemia.  He had ongoing uncontrolled chest pain. He was on ASA, Plavix, statin, ranolazine 500 mg bid.  - He had PPM so we were unable to get MRI viability study.  Echo showed akinetic apex/peri-apical area.   He was too unstable  for CTO procedure (on milrinone and norepinephrine but still with low heart output as evidenced by co-ox).  3. Hemoptysis: ?Related to pulmonary edema. 4. AKI: Suspect this is cardiorenal in setting of low output HF.   Creatinine peaked 1.8.  4. Heart block: He had St Jude PPM and was RV pacing. EP consulted but was not candidate for an upgrade due to worsening clinical condition. .   Duration of Discharge Encounter: Greater than 35 minutes   Signed, Darrick Grinder, NP 01/13/2018, 10:58 AM

## 2018-01-25 NOTE — Care Management Note (Signed)
Case Management Note Marvetta Gibbons RN, BSN Unit 4E-Case Manager-- [place] coverage [phone]  Patient Details  Name: Randall Callahan MRN: [id number] Date of Birth: Dec 26, 1927  Subjective/Objective:  Pt admitted with Acute on chronic systolic CHF with low output/cardiogenic shock, started on milrinone and norepi for HF treatment                 Action/Plan: PTA pt lived at home- [facility] consulted for [place]- have elected to move to comfort care- milrinone and nor-epi stopped and morphine drip started- per MD note expect pt to expire here in the hospital.   Expected Discharge Date:                  Expected Discharge Plan:  Expired  In-House Referral:     Discharge planning Services  CM Consult  Post Acute Care Choice:    Choice offered to:     DME Arranged:    DME Agency:     HH Arranged:    [place] Agency:     Status of Service:  In process, will continue to follow  If discussed at Long Length of Stay Meetings, dates discussed:    Discharge Disposition:   Additional Comments:  Dawayne Patricia, RN January 16, 2018, 10:35 AM

## 2018-01-25 NOTE — Progress Notes (Signed)
165mL of Morphine wasted with Paulina Fusi RN in sink

## 2018-01-25 NOTE — Progress Notes (Signed)
[facility] notified- referral # [id number]

## 2018-01-25 NOTE — Progress Notes (Signed)
Pt arrived to unit at 1615, upon moving pt from bed to bed pt took last breath, no HR. Witnessed by myself(Patti Manus Rudd RN  and Ericka Pontiff RN. Chaplain notified, Ericka Pontiff RN notified doctor.

## 2018-01-25 NOTE — Progress Notes (Signed)
Patient ID: Randall Callahan, male   DOB: 17-Jun-1928, 84 y.o.   MRN: [id number] P    Advanced Heart Failure Rounding Note  PCP-Cardiologist: Kirk Ruths, MD  [facility]  Subjective:    Patient empirically started on milrinone due to concern for low output on 3/14.  Milrinone increased to 0.375, and norepinephrine added and increased to 8.  Co-ox stayed marginal.   I reviewed 3/16 echo, this showed EF 20-25% with apical/peri-apical akinesis and moderate MR.    Family and patient met with palliative care over the weekend and opted for hospice/comfort care. Milrinone and norepinephrine were titrated off and he is now on morphine infusion.   Objective:   Weight Range: 196 lb 10.4 oz (89.2 kg) Body mass index is 26.67 kg/m.   Vital Signs:   Temp:  [97.9 F (36.6 C)-98.4 F (36.9 C)] 98.2 F (36.8 C) (03/17 1550) Pulse Rate:  [69-117] 70 (03/18 0600) Resp:  [12-33] 12 (03/18 0600) BP: (76-106)/(47-85) 76/54 (03/18 0600) SpO2:  [80 %-99 %] 86 % (03/18 0600) Last BM Date: 01/09/18  Weight change: Filed Weights   01/08/18 0500 01/09/18 0600 01/10/18 0600  Weight: 191 lb 5.8 oz (86.8 kg) 189 lb 13.1 oz (86.1 kg) 196 lb 10.4 oz (89.2 kg)    Intake/Output:   Intake/Output Summary (Last 24 hours) at 02/10/18 0737 Last data filed at 2018/02/10 0600 Gross per 24 hour  Intake 1451.42 ml  Output 1425 ml  Net 26.42 ml      Physical Exam    General: Sleeping Neck: JVP 10-12 cm, no thyromegaly or thyroid nodule.  Lungs: Decreased at bases.  CV: Nondisplaced PMI.  Heart regular S1/S2, no S3/S4, no murmur.  No peripheral edema.   Abdomen: Soft, nontender, no hepatosplenomegaly, no distention.  Skin: Intact without lesions or rashes.  Neurologic: Sleeping Extremities: No clubbing or cyanosis.  HEENT: Normal.    Telemetry   Telemetry off.   Labs    CBC Recent Labs    01/09/18 0431 01/10/18 0321  WBC 12.0* 14.8*  HGB 12.4* 12.1*  HCT 35.7* 35.0*  MCV 83.8 82.9    PLT 259 494   Basic Metabolic Panel Recent Labs    01/09/18 0431 01/10/18 0321  NA 132* 128*  K 3.7 3.7  CL 98* 95*  CO2 22 21*  GLUCOSE 174* 300*  BUN 40* 43*  CREATININE 1.81* 1.73*  CALCIUM 8.8* 8.9   Liver Function Tests No results for input(s): AST, ALT, ALKPHOS, BILITOT, PROT, ALBUMIN in the last 72 hours. No results for input(s): LIPASE, AMYLASE in the last 72 hours. Cardiac Enzymes No results for input(s): CKTOTAL, CKMB, CKMBINDEX, TROPONINI in the last 72 hours.  BNP: BNP (last 3 results) Recent Labs    09/29/17 0109 12/25/17 0755 12/27/2017 2008  BNP 205.7* 421.3* 615.2*    ProBNP (last 3 results) No results for input(s): PROBNP in the last 8760 hours.   D-Dimer No results for input(s): DDIMER in the last 72 hours. Hemoglobin A1C No results for input(s): HGBA1C in the last 72 hours. Fasting Lipid Panel No results for input(s): CHOL, HDL, LDLCALC, TRIG, CHOLHDL, LDLDIRECT in the last 72 hours. Thyroid Function Tests No results for input(s): TSH, T4TOTAL, T3FREE, THYROIDAB in the last 72 hours.  Invalid input(s): FREET3  Other results:   Imaging    No results found.   Medications:     Scheduled Medications: . furosemide  80 mg Intravenous BID  . glycopyrrolate  0.2 mg Intravenous Q4H  .  LORazepam  0.5 mg Intravenous Q6H  . sodium chloride flush  10-40 mL Intracatheter Q12H  . sodium chloride flush  3 mL Intravenous Q12H    Infusions: . sodium chloride 250 mL (17-Jan-2018 0600)  . milrinone Stopped (01/10/18 2300)  . morphine 1 mg/ml infusion - Palliative Care 5 mg/hr (01-17-18 0600)  . norepinephrine (LEVOPHED) Adult infusion Stopped (January 17, 2018 0200)    PRN Medications: sodium chloride, acetaminophen, famotidine, glycopyrrolate, hydroxypropyl methylcellulose / hypromellose, ipratropium-albuterol, LORazepam, LORazepam, midazolam, morphine, ondansetron (ZOFRAN) IV, phenazopyridine, polyethylene glycol, sodium chloride flush, sodium chloride  flush    Assessment/Plan   Patient with end stage ischemic cardiomyopathy and low output HF.  He is not candidate for advanced therapies with age.  We considered CTO procedure on LAD but ultimately decided that it would be unlikely to provide significant benefit.  Regardless, he would not be stable for procedure with dual inotropes.  Family and patient opted for hospice/comfort measures, which is very reasonable in this situation.  He is now on morphine gtt and sleeping.  I suspect that he will pass away some time during the day today.   Length of Stay: [place], MD  01-17-2018, 7:37 AM  Advanced Heart Failure Team Pager [phone] (M-F; 7a - 4p)  Please contact [facility] for night-coverage after hours (4p -7a ) and weekends on [email or web address]

## 2018-01-25 NOTE — Progress Notes (Signed)
Daily Progress Note   Patient Name: Randall Callahan       Date: 2018/01/25 DOB: 09-23-1928  Age: 84 y.o. MRN#: [id number] Attending Physician: Larey Dresser, MD Primary Care Physician: Dineen Kid, MD Admit Date: 01/21/2018  Reason for Consultation/Follow-up: Establishing goals of care and Terminal Care  Subjective: Patient responds to pain, otherwise lethargic. Slight eyebrow furrowing and accessory muscle use. RN notified to give prn morphine bolus via infusion.   GOC:  Son and DIL Randall Callahan and Randall Callahan) at bedside. Again discussed goal for comfort and dignity at EOL. Educated on EOL expectations. They are at peace and relieved that he has remained fairly comfortable on morphine gtt and off levo/milrinone. They understand prognosis of hours-days. Offered chaplain services. Randall Callahan tells me the patient declined chaplain yesterday. Encouraged them to contact nurse to page chaplain if needed. Also educated on bereavement support available via hospice. Emotional support provided.   Length of Stay: 4  Current Medications: Scheduled Meds:  . furosemide  80 mg Intravenous BID  . glycopyrrolate  0.2 mg Intravenous Q4H  . LORazepam  0.5 mg Intravenous Q6H  . sodium chloride flush  10-40 mL Intracatheter Q12H  . sodium chloride flush  3 mL Intravenous Q12H    Continuous Infusions: . sodium chloride 250 mL (01/25/18 0600)  . milrinone Stopped (01/10/18 2300)  . morphine 1 mg/ml infusion - Palliative Care 5 mg/hr (01/25/18 0600)  . norepinephrine (LEVOPHED) Adult infusion Stopped (2018/01/25 0200)    PRN Meds: sodium chloride, acetaminophen, famotidine, glycopyrrolate, hydroxypropyl methylcellulose / hypromellose, ipratropium-albuterol, LORazepam, LORazepam, midazolam, morphine, ondansetron  (ZOFRAN) IV, phenazopyridine, polyethylene glycol, sodium chloride flush, sodium chloride flush  Physical Exam  Constitutional: He appears lethargic. He appears ill.  HENT:  Head: Normocephalic and atraumatic.  Pulmonary/Chest: Accessory muscle usage present.  RN to give prn morphine  Neurological: He appears lethargic.  Skin: Skin is warm and dry.  Nursing note and vitals reviewed.          Vital Signs: BP (!) 76/54   Pulse 63   Temp 98.2 F (36.8 C) (Oral)   Resp 15   Ht 6' (1.829 m)   Wt 89.2 kg (196 lb 10.4 oz)   SpO2 (!) 79%   BMI 26.67 kg/m  SpO2: SpO2: (!) 79 % O2 Device: O2 Device: Nasal Cannula O2 Flow  Rate: O2 Flow Rate (L/min): 2 L/min  Intake/output summary:   Intake/Output Summary (Last 24 hours) at 01/27/18 1017 Last data filed at 27-Jan-2018 0900 Gross per 24 hour  Intake 987.02 ml  Output 1440 ml  Net -452.98 ml   LBM: Last BM Date: 01/09/18 Baseline Weight: Weight: 86.2 kg (190 lb) Most recent weight: Weight: 89.2 kg (196 lb 10.4 oz)       Palliative Assessment/Data: PPS 10%     Patient Active Problem List   Diagnosis Date Noted  . Anxiety state   . Palliative care by specialist   . Goals of care, counseling/discussion   . Complete heart block ([place])   . Cardiogenic shock ([place])   . AKI (acute kidney injury) ([place])   . Hypotension 01/13/2018  . Unstable angina ([place]) 12/25/2017  . Squamous cell carcinoma in situ of skin   . Schatzki's ring   . Pneumonia   . Peripheral neuropathy   . MCI (mild cognitive impairment)   . Hypertension   . Hx of sick sinus syndrome   . High cholesterol   . Diverticulosis   . GERD (gastroesophageal reflux disease)   . CAD S/P percutaneous coronary angioplasty   . Complication of anesthesia   . CKD (chronic kidney disease), stage III ([place])   . Chronic lower back pain   . Chest tightness   . Chest pain   . Bradycardia   . Cancer of skin, face   . Coronary artery disease involving native heart with angina  pectoris ([place])   . Arthritis   . Angiomyolipoma of kidney   . Angina pectoris, unstable ([place])   . Dyspnea   . CHF (congestive heart failure) ([place]) 09/29/2017  . Hyperkalemia 09/29/2017  . Hyponatremia 09/29/2017  . Gait abnormality 01/08/2017  . Neck pain 01/08/2017  . Low back pain 01/08/2017  . Cardiomyopathy, ischemic 12/17/2016  . Non-insulin treated type 2 diabetes mellitus ([place]) 12/17/2016  . Hyperlipidemia 12/17/2016  . NSTEMI (non-ST elevation myocardial infarction) ([place]) 08/27/2016  . Presence of permanent cardiac pacemaker 10/27/2013  . Cancer [facility]) 10/27/2000    Palliative Care Assessment & Plan   Patient Profile: Randall Callahan is an 65OV male with complicated history of CAD s/p stent, NSTEMI, ICM with EF 20-25%, SSS s/p PPM, CKD stage II, and DM 2 admitted with weakness, hypotension, and cardiogenic shock. Patient with acute on chronic systolic CHF. ECHO 3/16 with EF 20-25% and apical/peri-apical akinesis. On levo and milrinone continuous infusions. Receiving Lasix 80mg  BID. Per cardiology, suspect end-stage ischemic cardiomyopathy. Patient is not a candidate for advanced therapies and recommending palliative/hospice interventions. AKI in setting of low ouput HF/cardiorenal syndrome. DNR/DNI. Palliative medicine consultation for goals of care.   Assessment: Acute on chronic systolic CHF Cardiogenic shock Ischemic cardiomyopathy with EF 20-25% CAD SSS s/p pacemaker AKI   Recommendations/Plan:  Comfort measures only. Interventions not aimed at comfort have been discontinued.   Continue current medication regimen for symptom management.  Prognosis hours-days. Anticipate hospital death.   PMT will follow.   Code Status: DNR/DNI   Code Status Orders  (From admission, onward)        Start     Ordered   01/09/18 1211  Do not attempt resuscitation (DNR)  Continuous    Question Answer Comment  In the event of cardiac or respiratory ARREST Do not call a  "code blue"   In the event of cardiac or respiratory ARREST Do not perform Intubation, CPR, defibrillation or ACLS   In  the event of cardiac or respiratory ARREST Use medication by any route, position, wound care, and other measures to relive pain and suffering. May use oxygen, suction and manual treatment of airway obstruction as needed for comfort.      01/09/18 1210    Code Status History    Date Active Date Inactive Code Status Order ID Comments User Context   01/10/2018 19:59 01/09/2018 12:10 Full Code [id number]  Tommie Raymond, NP ED   12/25/2017 14:43 12/30/2017 16:46 Full Code [id number]  Reola Mosher Inpatient   09/29/2017 05:07 10/03/2017 14:17 Full Code [id number]  Jani Gravel, MD ED       Prognosis:   Hours - Days: poor prognosis with end-stage ischemic cardiomyopathy, systolic heart failure, and cardiogenic shock. Not a candidate for invasive cardiac procedures. Levo/milrinone infusions discontinued.   Discharge Planning:  Anticipated Hospital Death  Care plan was discussed with son, DIL, RN, Dr. Domingo Cocking  Thank you for allowing the Palliative Medicine Team to assist in the care of this patient.   Time In: 0955 Time Out: 1025 Total Time 40min Prolonged Time Billed no      Greater than 50%  of this time was spent counseling and coordinating care related to the above assessment and plan.  Ihor Dow, FNP-C Palliative Medicine Team  Phone: [phone] Fax: [phone]  Please contact Palliative Medicine Team phone at [phone] for questions and concerns.

## 2018-01-25 NOTE — Progress Notes (Signed)
MD notified of pt's passing

## 2018-01-25 DEATH — deceased

## 2018-01-27 ENCOUNTER — Encounter (HOSPITAL_COMMUNITY): Payer: Medicare Other

## 2018-01-29 ENCOUNTER — Encounter (HOSPITAL_COMMUNITY): Payer: Medicare Other

## 2018-02-01 ENCOUNTER — Encounter (HOSPITAL_COMMUNITY): Payer: Medicare Other

## 2018-02-01 ENCOUNTER — Ambulatory Visit: Payer: Medicare Other | Admitting: Cardiology

## 2018-02-03 ENCOUNTER — Encounter (HOSPITAL_COMMUNITY): Payer: Medicare Other

## 2018-02-05 ENCOUNTER — Encounter (HOSPITAL_COMMUNITY): Payer: Medicare Other

## 2018-02-08 ENCOUNTER — Encounter (HOSPITAL_COMMUNITY): Payer: Medicare Other

## 2018-02-10 ENCOUNTER — Encounter (HOSPITAL_COMMUNITY): Payer: Medicare Other

## 2018-02-12 ENCOUNTER — Encounter (HOSPITAL_COMMUNITY): Payer: Medicare Other

## 2018-02-15 ENCOUNTER — Encounter (HOSPITAL_COMMUNITY): Payer: Medicare Other

## 2018-02-17 ENCOUNTER — Encounter (HOSPITAL_COMMUNITY): Payer: Medicare Other

## 2018-02-19 ENCOUNTER — Encounter (HOSPITAL_COMMUNITY): Payer: Medicare Other

## 2018-02-22 ENCOUNTER — Encounter (HOSPITAL_COMMUNITY): Payer: Medicare Other

## 2018-02-24 ENCOUNTER — Encounter (HOSPITAL_COMMUNITY): Payer: Medicare Other

## 2018-02-26 ENCOUNTER — Encounter (HOSPITAL_COMMUNITY): Payer: Medicare Other

## 2018-03-01 ENCOUNTER — Encounter (HOSPITAL_COMMUNITY): Payer: Medicare Other

## 2018-03-03 ENCOUNTER — Encounter (HOSPITAL_COMMUNITY): Payer: Medicare Other

## 2018-03-05 ENCOUNTER — Encounter (HOSPITAL_COMMUNITY): Payer: Medicare Other

## 2018-03-08 ENCOUNTER — Encounter (HOSPITAL_COMMUNITY): Payer: Medicare Other

## 2018-03-10 ENCOUNTER — Encounter (HOSPITAL_COMMUNITY): Payer: Medicare Other

## 2018-03-12 ENCOUNTER — Encounter (HOSPITAL_COMMUNITY): Payer: Medicare Other

## 2018-05-05 ENCOUNTER — Encounter (INDEPENDENT_AMBULATORY_CARE_PROVIDER_SITE_OTHER): Payer: Medicare Other | Admitting: Ophthalmology

## 2018-11-06 IMAGING — CR DG CHEST 2V
2 series · 2 of 2 positions shown · non-contrast
Comparison: None.

CLINICAL DATA: 89-year-old male with wheezing and nonproductive
cough for 3 days.

EXAM:
CHEST  2 VIEW

[w chest pa]
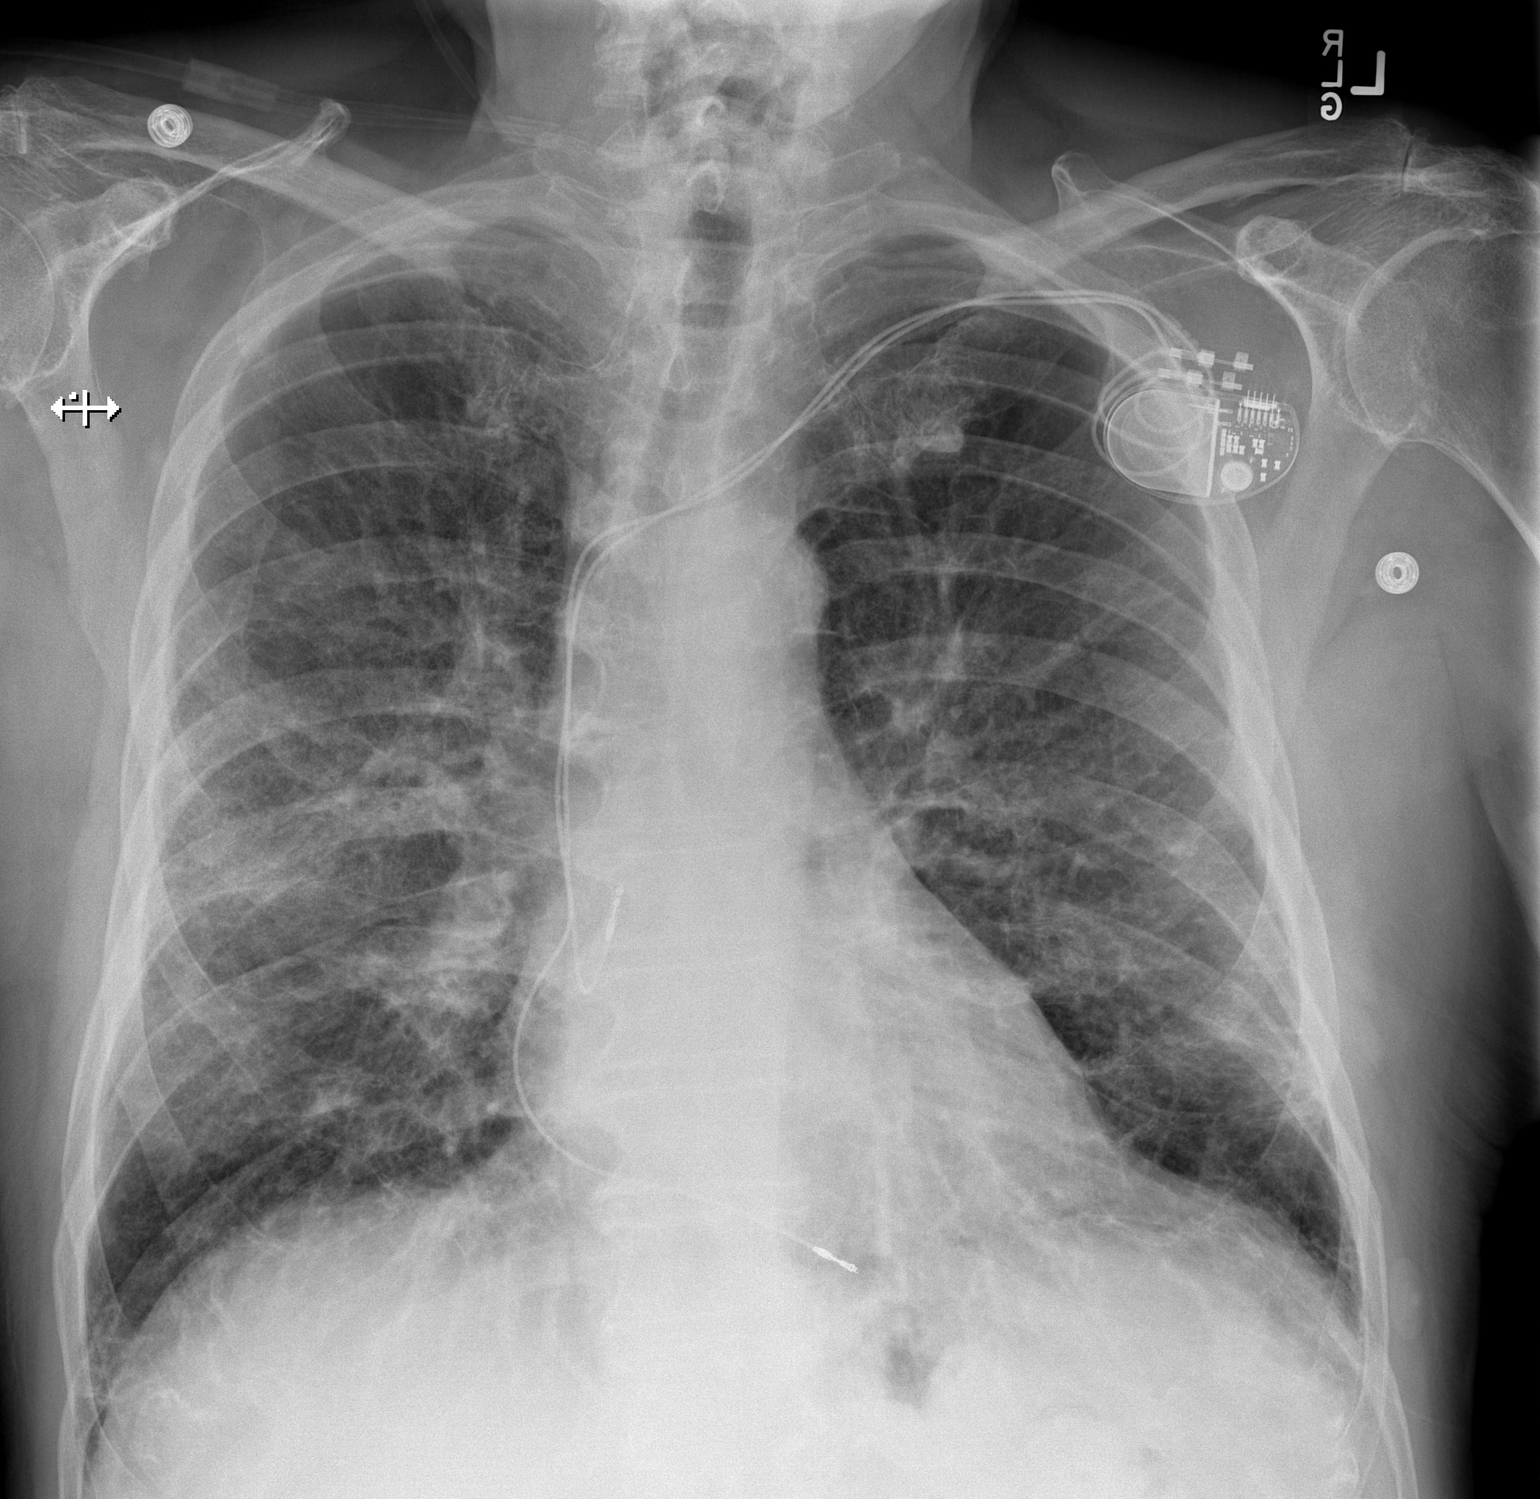

[w chest lat]
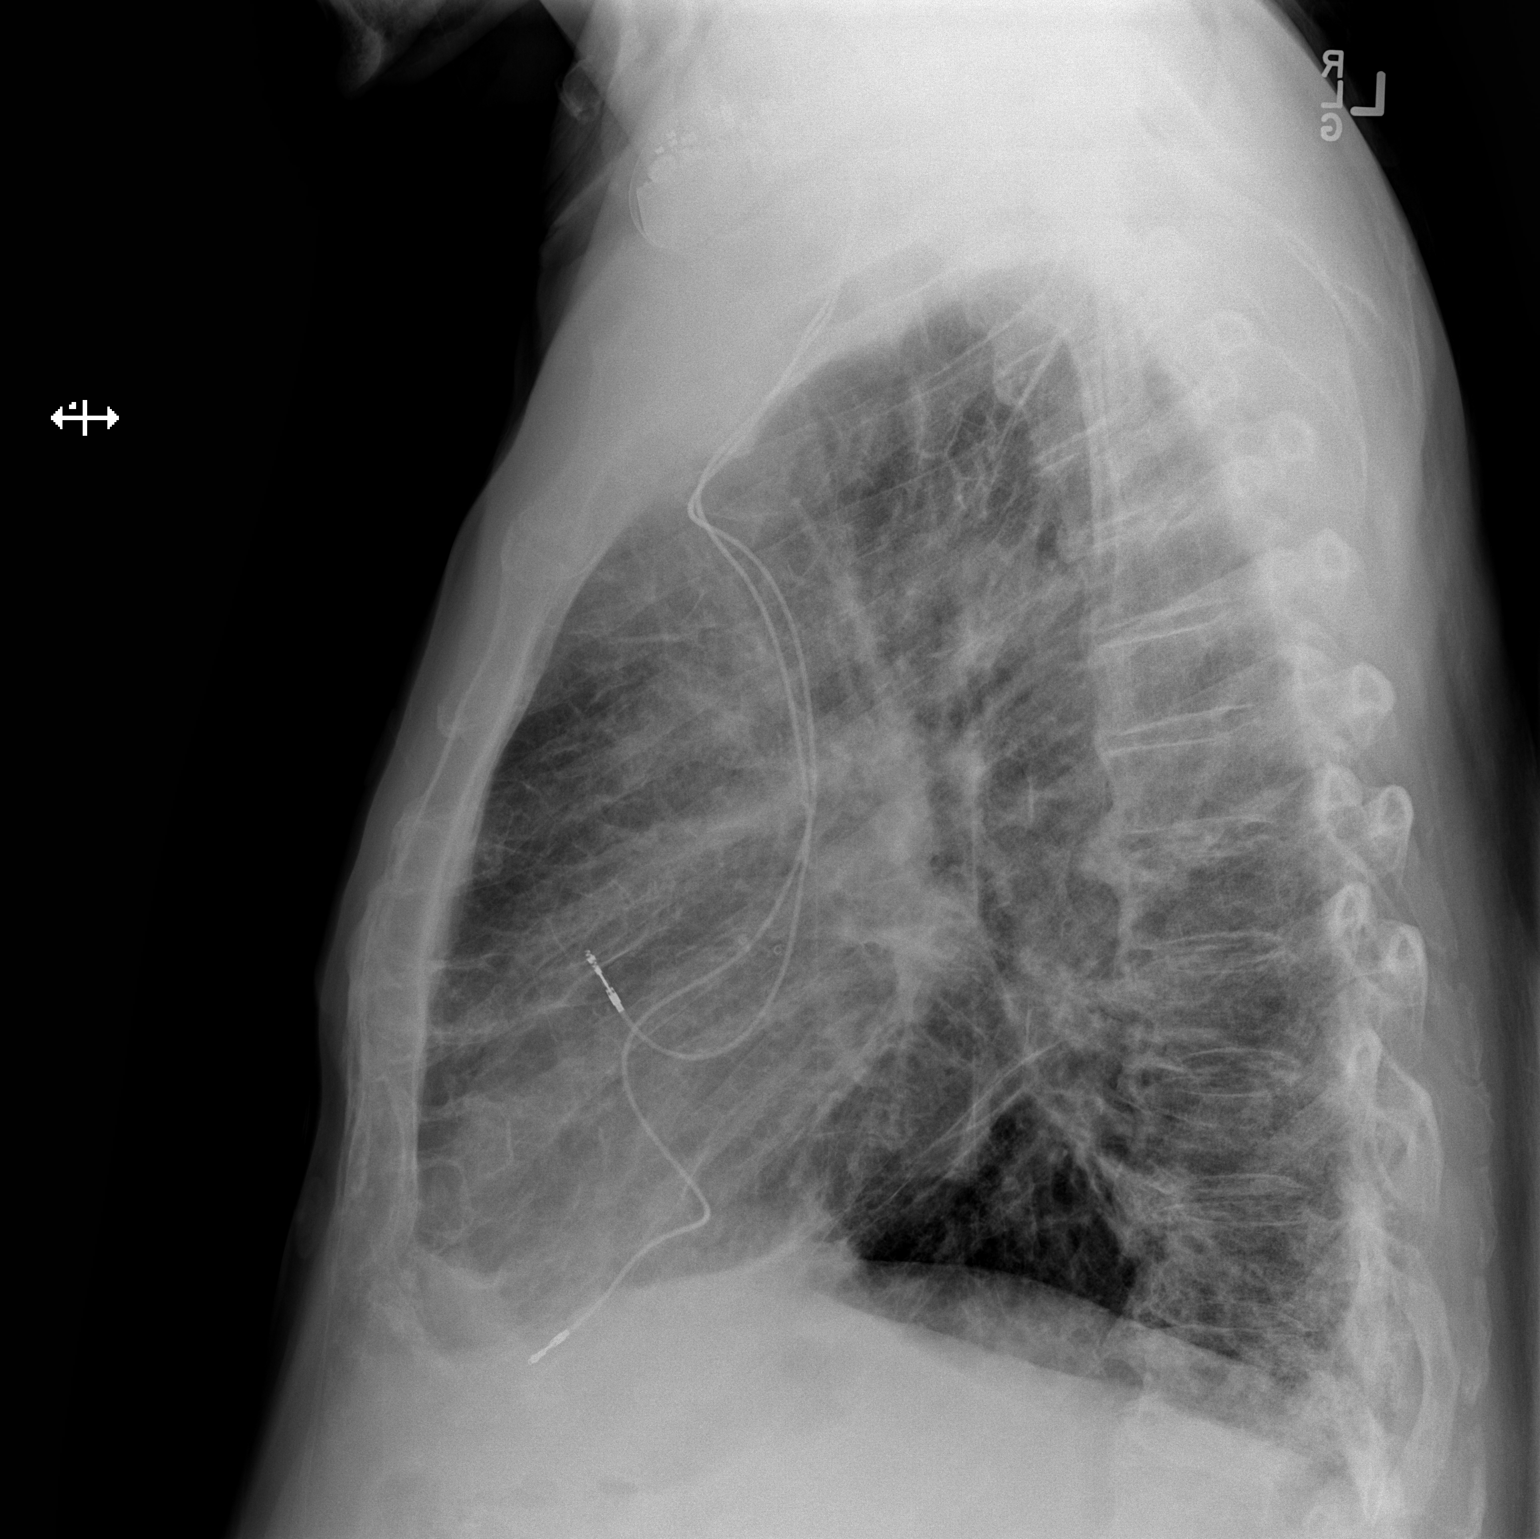

[2 of 2 positions shown; findings below may reference images not displayed]

FINDINGS: Left chest dual lead cardiac pacemaker. Large lung volumes.
Mediastinal contours are within normal limits. Visualized tracheal
air column is within normal limits.

Attenuated bronchovascular markings in the upper lobes and at the
lung bases suggesting emphysema. Superimposed bilateral perihilar
patchy and reticulonodular opacity greater on the right. No
consolidation. No pleural effusion.

No acute osseous abnormality identified. Negative visible bowel gas
pattern.
IMPRESSION: 1. Chronic lung disease with hyperinflation.
2. No prior comparison. Right greater than left perihilar patchy and
interstitial opacities are nonspecific but suspicious for multifocal
pneumonia in this clinical setting. No pleural effusion.
3. Left chest pacemaker.

## 2019-06-18 IMAGING — DX DG CHEST 2V
2 series · 2 of 2 positions shown · non-contrast
Comparison: Prior radiograph from 02/17/2017.

CLINICAL DATA: Initial evaluation for acute chest pain. Job
discomfort, bilateral upper arm pain.

EXAM:
CHEST  2 VIEW

[chest ap]
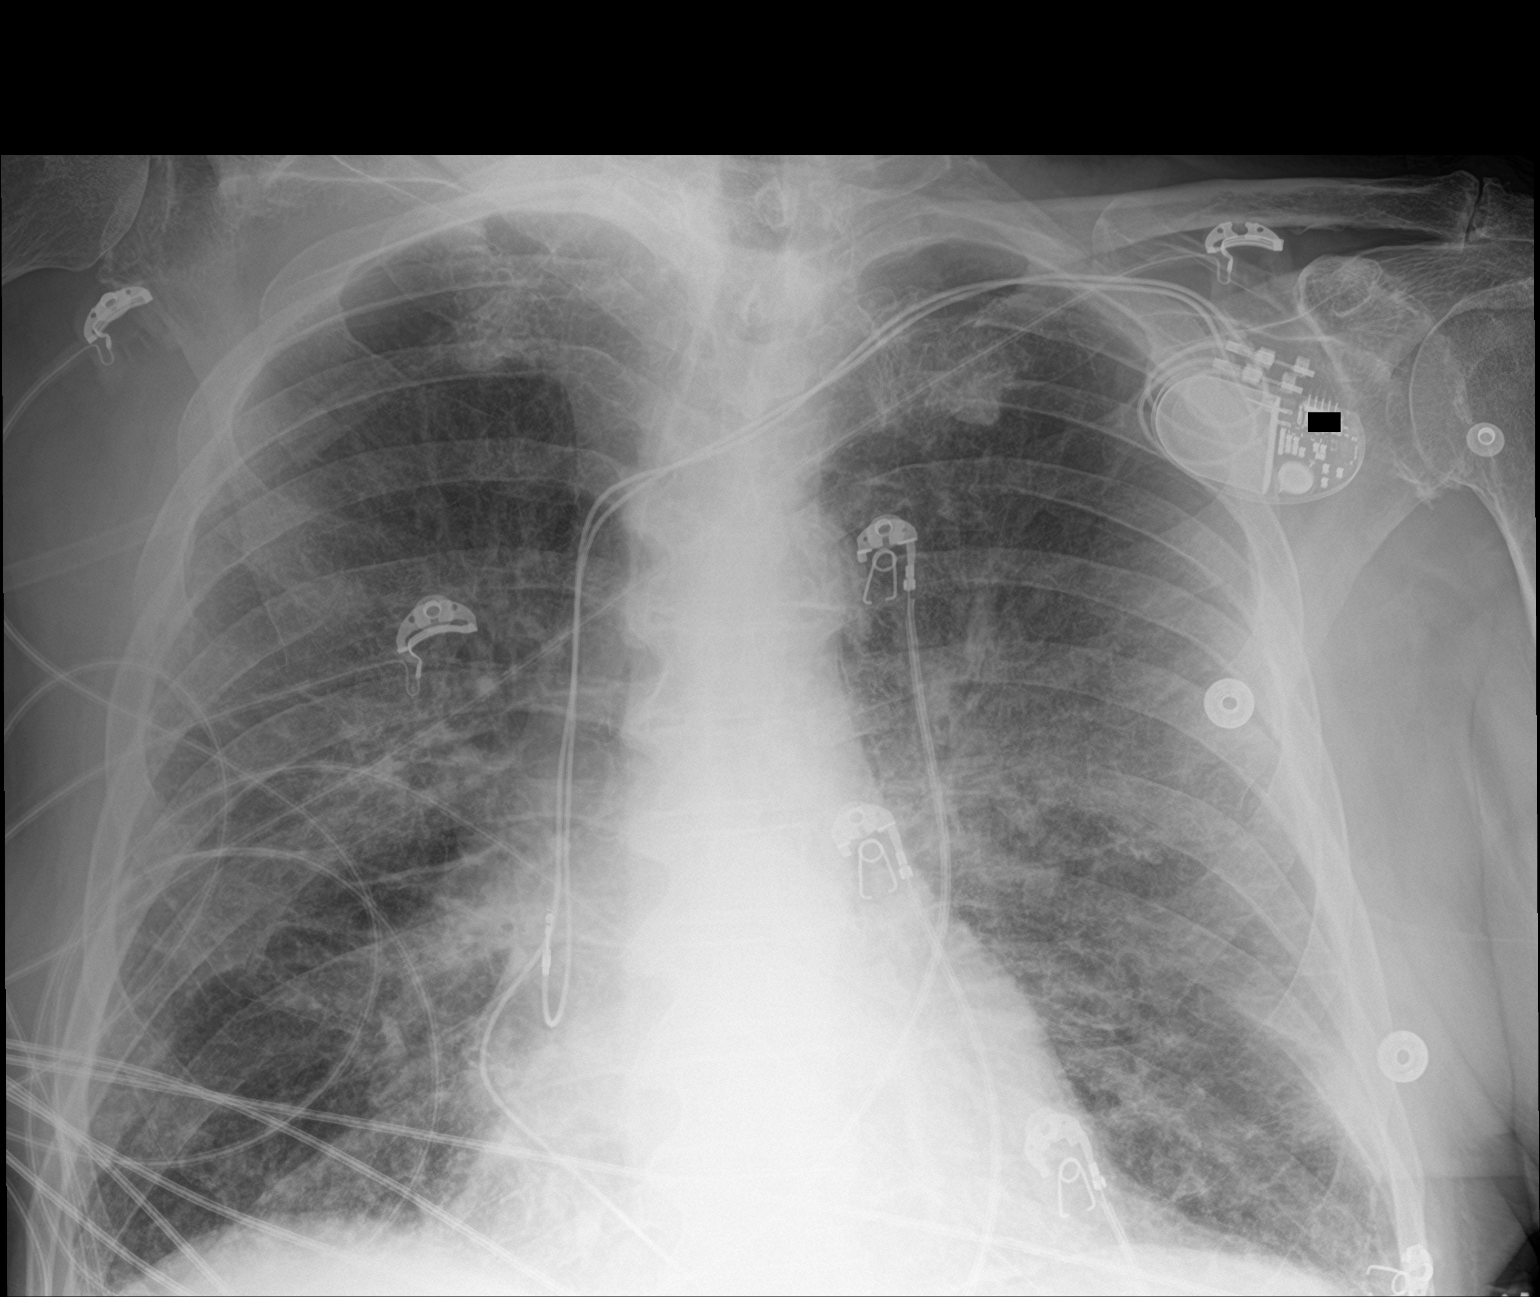

[chest lat]
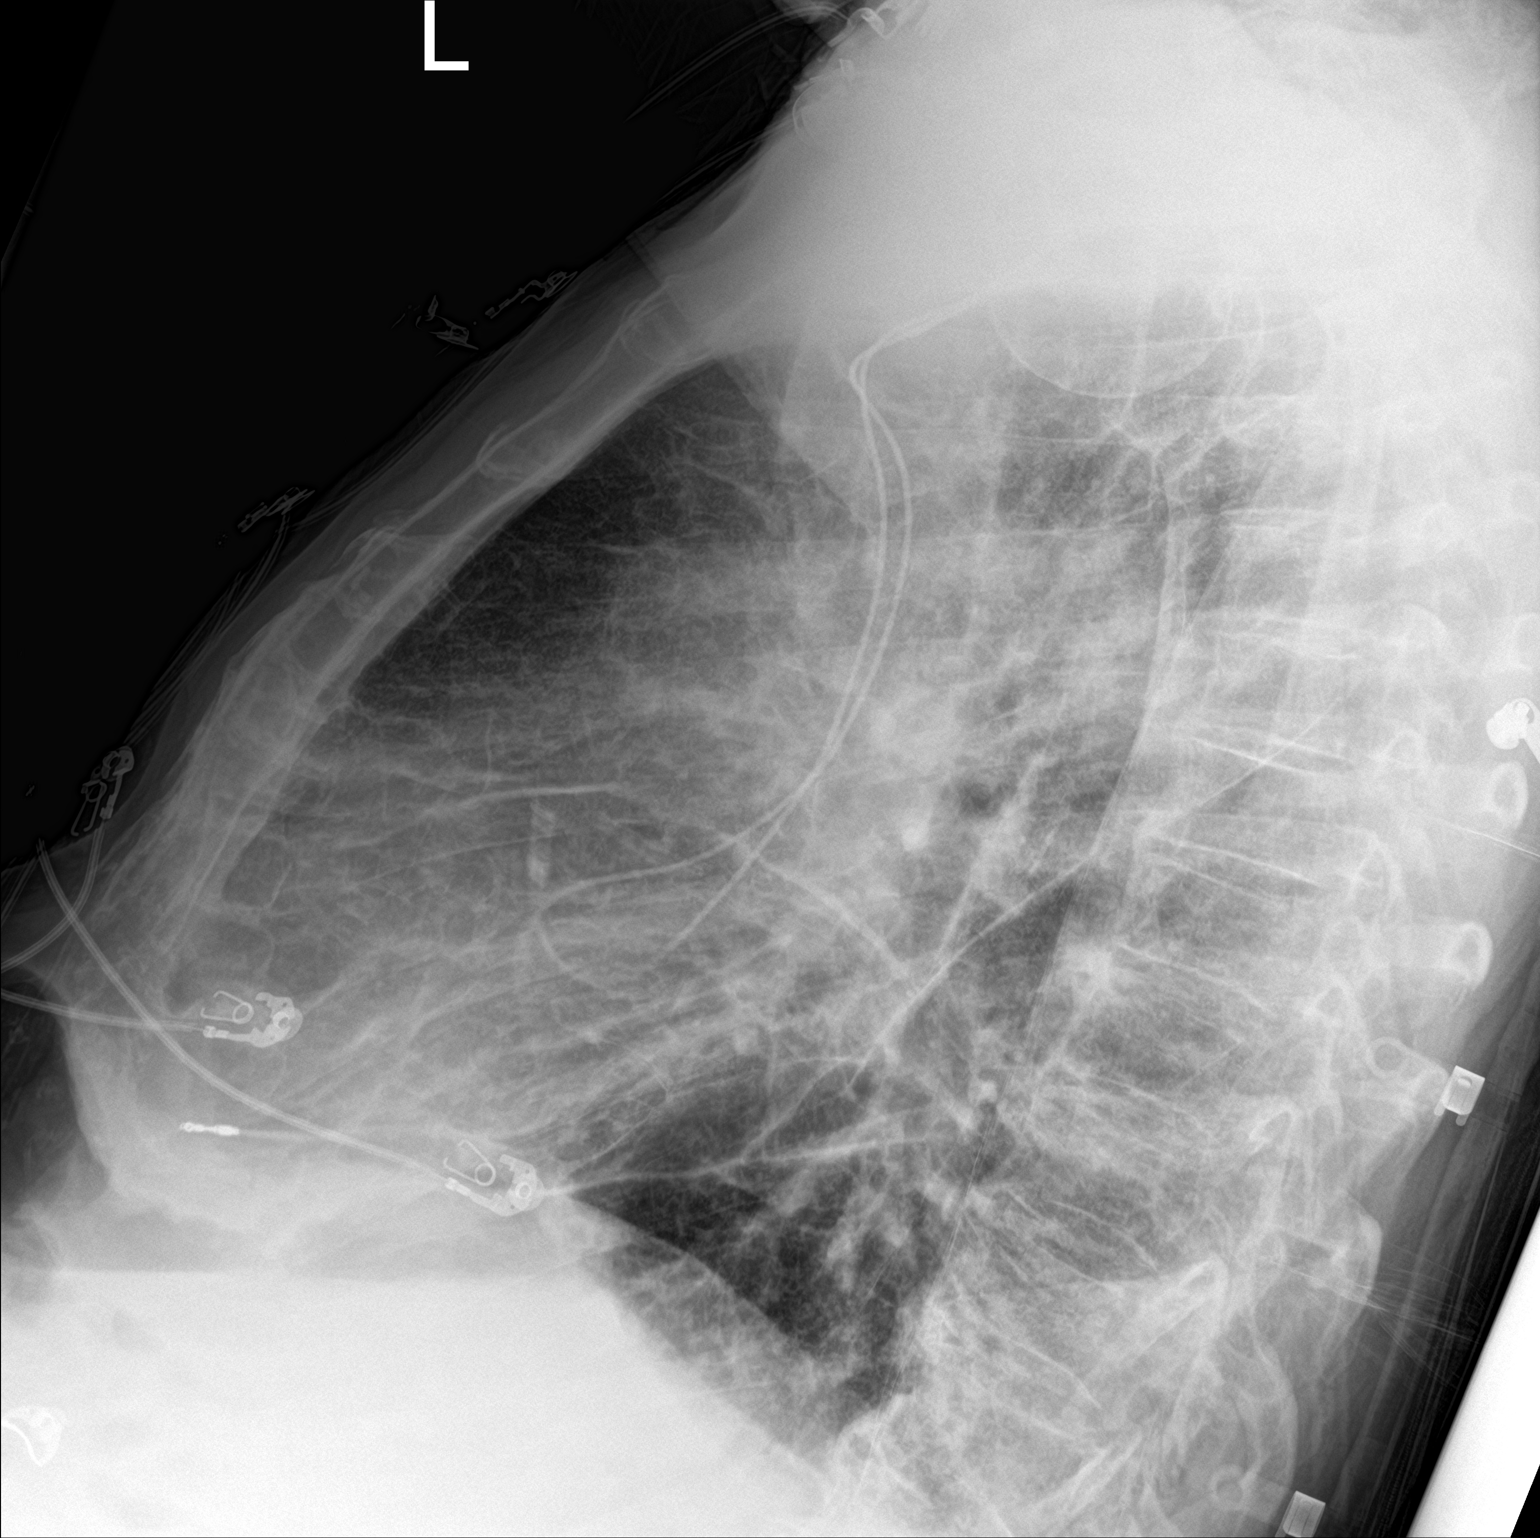

[2 of 2 positions shown; findings below may reference images not displayed]

FINDINGS: The cardiac and mediastinal silhouettes are stable in size and
contour, and remain within normal limits. Left-sided pacemaker/AICD
in place. Aortic atherosclerosis.

Lungs are mildly hyperinflated. Underlying chronic lung changes
noted, grossly similar to previous. Superimposed diffuse vascular
and interstitial prominence, consistent with pulmonary interstitial
edema. Evaluation for pleural effusion limited as the costophrenic
angles are incompletely visualized. No consolidative airspace
disease. No pneumothorax.

No acute osseus abnormality.  Osteopenia.
IMPRESSION: 1. Chronic lung changes with superimposed diffuse interstitial
prominence, compatible with mild pulmonary interstitial edema.
2. Aortic atherosclerosis.

## 2019-06-21 IMAGING — DX DG CHEST 1V PORT
2 series · 2 of 2 positions shown · non-contrast
Comparison: 09/29/2017

CLINICAL DATA: Shortness of breath.

EXAM:
PORTABLE CHEST 1 VIEW

[chest ap (1 of 2)]
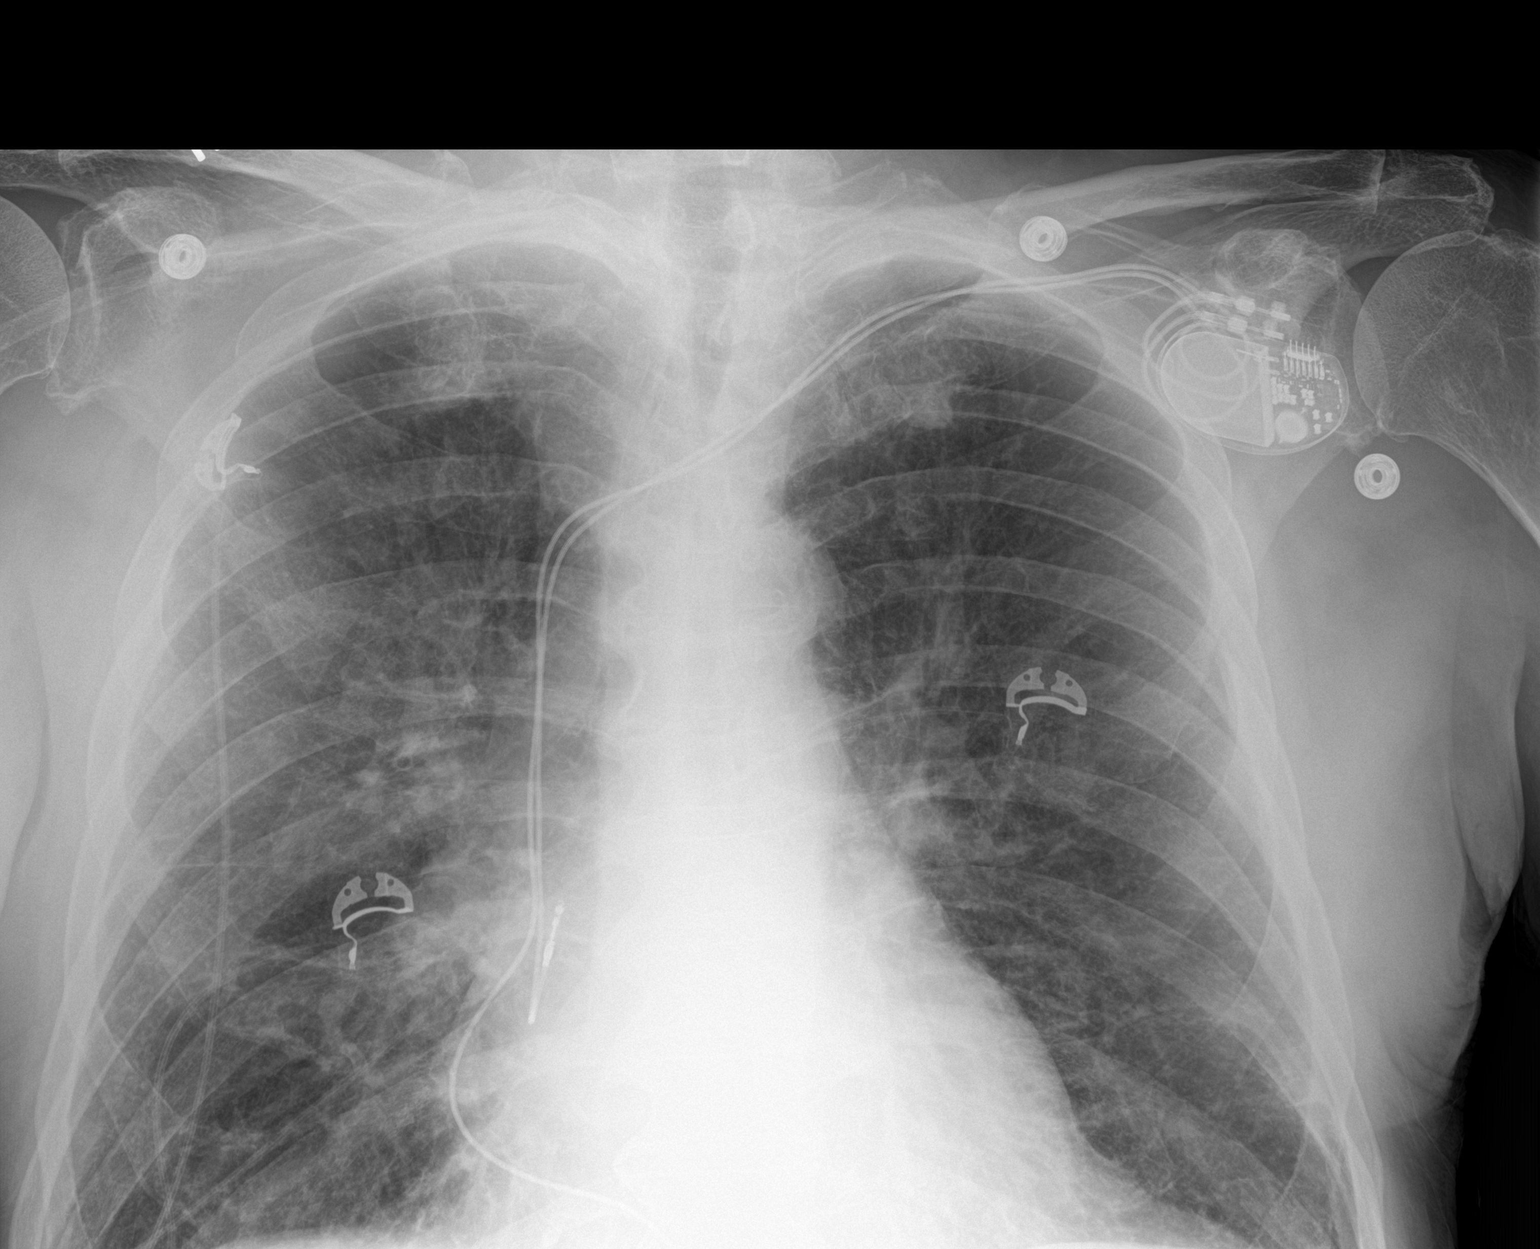

[chest ap (2 of 2)]
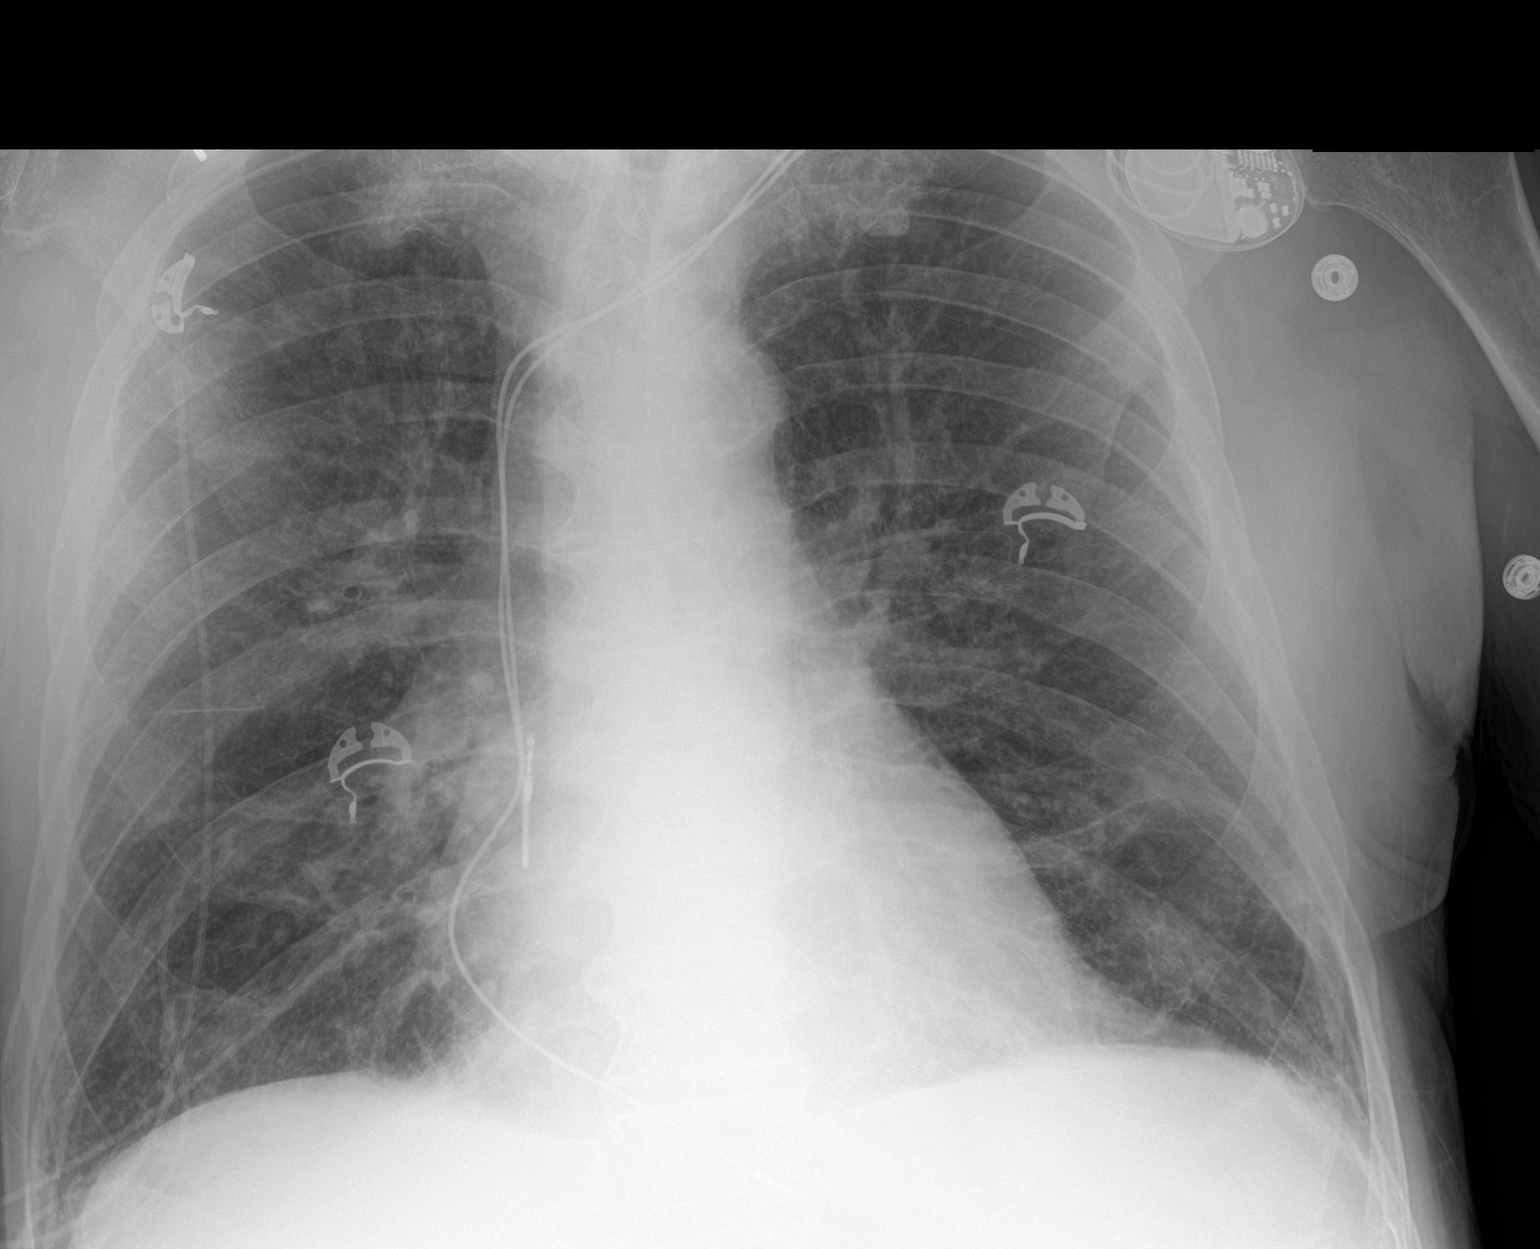

[2 of 2 positions shown; findings below may reference images not displayed]

FINDINGS: Heart size remains normal. Pacemaker appears the same. Interstitial
edema seen 3 days ago has resolved. Mild background pulmonary
scarring. No infiltrate, collapse or effusion.
IMPRESSION: No active disease. Resolution of previously seen interstitial edema.
